# Patient Record
Sex: Female | Born: 1975 | Race: Black or African American | Hispanic: No | Marital: Single | Smoking: Former smoker
Health system: Southern US, Community
[De-identification: ages and names within clinical notes are randomized; demographics above are authoritative.]

## PROBLEM LIST (undated history)

## (undated) ENCOUNTER — Ambulatory Visit: Admission: EM

## (undated) DIAGNOSIS — Z8669 Personal history of other diseases of the nervous system and sense organs: Secondary | ICD-10-CM

## (undated) DIAGNOSIS — L309 Dermatitis, unspecified: Secondary | ICD-10-CM

## (undated) DIAGNOSIS — Z8709 Personal history of other diseases of the respiratory system: Secondary | ICD-10-CM

## (undated) DIAGNOSIS — R569 Unspecified convulsions: Secondary | ICD-10-CM

## (undated) DIAGNOSIS — R519 Headache, unspecified: Secondary | ICD-10-CM

## (undated) DIAGNOSIS — L509 Urticaria, unspecified: Secondary | ICD-10-CM

## (undated) DIAGNOSIS — F419 Anxiety disorder, unspecified: Secondary | ICD-10-CM

## (undated) DIAGNOSIS — K644 Residual hemorrhoidal skin tags: Secondary | ICD-10-CM

## (undated) DIAGNOSIS — I10 Essential (primary) hypertension: Secondary | ICD-10-CM

## (undated) DIAGNOSIS — R51 Headache: Secondary | ICD-10-CM

## (undated) DIAGNOSIS — T7840XA Allergy, unspecified, initial encounter: Secondary | ICD-10-CM

## (undated) DIAGNOSIS — J45909 Unspecified asthma, uncomplicated: Secondary | ICD-10-CM

## (undated) DIAGNOSIS — J4540 Moderate persistent asthma, uncomplicated: Secondary | ICD-10-CM

## (undated) HISTORY — DX: Moderate persistent asthma, uncomplicated: J45.40

## (undated) HISTORY — DX: Unspecified convulsions: R56.9

## (undated) HISTORY — PX: ABDOMINAL HYSTERECTOMY: SHX81

## (undated) HISTORY — DX: Personal history of other diseases of the nervous system and sense organs: Z86.69

## (undated) HISTORY — DX: Personal history of other diseases of the respiratory system: Z87.09

## (undated) HISTORY — PX: DILATION AND CURETTAGE OF UTERUS: SHX78

## (undated) HISTORY — DX: Urticaria, unspecified: L50.9

## (undated) HISTORY — DX: Allergy, unspecified, initial encounter: T78.40XA

## (undated) HISTORY — DX: Essential (primary) hypertension: I10

## (undated) HISTORY — DX: Dermatitis, unspecified: L30.9

## (undated) HISTORY — PX: CERVIX SURGERY: SHX593

---

## 2014-10-25 LAB — HM PAP SMEAR: HM Pap smear: NEGATIVE

## 2016-05-02 ENCOUNTER — Ambulatory Visit (HOSPITAL_COMMUNITY)
Admission: EM | Admit: 2016-05-02 | Discharge: 2016-05-02 | Disposition: A | Discharge status: Home / Self Care | Payer: Managed Care, Other (non HMO) | Attending: Internal Medicine | Admitting: Internal Medicine

## 2016-05-02 ENCOUNTER — Encounter (HOSPITAL_COMMUNITY): Payer: Self-pay | Admitting: Emergency Medicine

## 2016-05-02 DIAGNOSIS — G43009 Migraine without aura, not intractable, without status migrainosus: Secondary | ICD-10-CM | POA: Diagnosis not present

## 2016-05-02 DIAGNOSIS — G44049 Chronic paroxysmal hemicrania, not intractable: Secondary | ICD-10-CM

## 2016-05-02 MED ORDER — DEXAMETHASONE SODIUM PHOSPHATE 10 MG/ML IJ SOLN
10.0000 mg | Freq: Once | INTRAMUSCULAR | Status: AC
  Administered 2016-05-02: 10 mg via INTRAMUSCULAR

## 2016-05-02 MED ORDER — ONDANSETRON 4 MG PO TBDP
ORAL_TABLET | ORAL | Status: AC
  Filled 2016-05-02: qty 1

## 2016-05-02 MED ORDER — KETOROLAC TROMETHAMINE 30 MG/ML IJ SOLN
30.0000 mg | Freq: Once | INTRAMUSCULAR | Status: AC
  Administered 2016-05-02: 30 mg via INTRAMUSCULAR

## 2016-05-02 MED ORDER — KETOROLAC TROMETHAMINE 30 MG/ML IJ SOLN
INTRAMUSCULAR | Status: AC
  Filled 2016-05-02: qty 1

## 2016-05-02 MED ORDER — DEXAMETHASONE SODIUM PHOSPHATE 10 MG/ML IJ SOLN
INTRAMUSCULAR | Status: AC
  Filled 2016-05-02: qty 1

## 2016-05-02 MED ORDER — ONDANSETRON HCL 4 MG PO TABS: 4.0000 mg | ORAL_TABLET | Freq: Four times a day (QID) | ORAL | 0 refills | Status: DC

## 2016-05-02 MED ORDER — INDOMETHACIN 50 MG PO CAPS: 50.0000 mg | ORAL_CAPSULE | Freq: Two times a day (BID) | ORAL | 0 refills | Status: DC

## 2016-05-02 MED ORDER — ONDANSETRON 4 MG PO TBDP
4.0000 mg | ORAL_TABLET | Freq: Once | ORAL | Status: AC
  Administered 2016-05-02: 4 mg via ORAL

## 2016-05-02 NOTE — ED Triage Notes (Signed)
Here for intermittent HA onset x2 weeks associated w/nausea  A&O x4... NAD

## 2016-05-02 NOTE — ED Provider Notes (Signed)
CSN: [id number]     Arrival date & time 05/02/16  1926 History   First MD Initiated Contact with Patient 05/02/16 2119     Chief Complaint  Patient presents with  . Headache   (Consider location/radiation/quality/duration/timing/severity/associated sxs/prior Treatment) HPI History obtained from patient:  Pt presents with the cc of:  Headache Duration of symptoms: 2 weeks Treatment prior to arrival: Over-the-counter medications and previous migraine medications without relief. Context: Typical migraine type headache but lasted longer than usual. Other symptoms include: None Pain score: 4 FAMILY HISTORY: Hypertension    History reviewed. No pertinent past medical history. History reviewed. No pertinent surgical history. No family history on file. Social History  Substance Use Topics  . Smoking status: Never Smoker  . Smokeless tobacco: Never Used  . Alcohol use No   OB History    No data available     Review of Systems  Denies: ABDOMINAL PAIN, CHEST PAIN, CONGESTION, DYSURIA, SHORTNESS OF BREATH  Allergies  Review of patient's allergies indicates no known allergies.  Home Medications   Prior to Admission medications   Medication Sig Start Date End Date Taking? Authorizing Provider  indomethacin (INDOCIN) 50 MG capsule Take 1 capsule (50 mg total) by mouth 2 (two) times daily with a meal. 05/02/16   Konrad Felix, PA  ondansetron (ZOFRAN) 4 MG tablet Take 1 tablet (4 mg total) by mouth every 6 (six) hours. 05/02/16   Konrad Felix, PA   Meds Ordered and Administered this Visit   Medications  ketorolac (TORADOL) 30 MG/ML injection 30 mg (not administered)  dexamethasone (DECADRON) injection 10 mg (not administered)  ondansetron (ZOFRAN-ODT) disintegrating tablet 4 mg (not administered)    BP 157/95 (BP Location: Right Arm)   Pulse 80   Temp 97.9 F (36.6 C) (Oral)   Resp 18   SpO2 100%  No data found.   Physical Exam NURSES NOTES AND VITAL SIGNS  REVIEWED. CONSTITUTIONAL: Well developed, well nourished, no acute distress HEENT: normocephalic, atraumatic EYES: Conjunctiva normal NECK:normal ROM, supple, no adenopathy PULMONARY:No respiratory distress, normal effort ABDOMINAL: Soft, ND, NT BS+, No CVAT MUSCULOSKELETAL: Normal ROM of all extremities,  SKIN: warm and dry without rash PSYCHIATRIC: Mood and affect, behavior are normal NEUROLOGICAL SCREENING EXAM: Constitutional:  oriented to person, place, and time.  Neurological:  .  normal strength and normal reflexes. No cranial nerve deficit or sensory deficit. negative Romberg sign. GCS eye subscore is 4. GCS verbal subscore is 5. GCS motor subscore is 6.    Urgent Care Course   Clinical Course    Procedures (including critical care time)  Labs Review Labs Reviewed - No data to display  Imaging Review No results found.   Visual Acuity Review  Right Eye Distance:   Left Eye Distance:   Bilateral Distance:    Right Eye Near:   Left Eye Near:    Bilateral Near:        Headache and nausea both better at discharge. MDM   1. Chronic paroxysmal hemicrania, not intractable   2. Migraine without aura and without status migrainosus, not intractable     Patient is reassured that there are no issues that require transfer to higher level of care at this time or additional tests. Patient is advised to continue home symptomatic treatment. Patient is advised that if there are new or worsening symptoms to attend the emergency department, contact primary care provider, or return to UC. Instructions of care provided discharged home in stable condition.  THIS NOTE WAS GENERATED USING A VOICE RECOGNITION SOFTWARE PROGRAM. ALL REASONABLE EFFORTS  WERE MADE TO PROOFREAD THIS DOCUMENT FOR ACCURACY.  I have verbally reviewed the discharge instructions with the patient. A printed AVS was given to the patient.  All questions were answered prior to discharge.      Konrad Felix, PA 05/02/16 2143

## 2016-05-02 NOTE — Discharge Instructions (Signed)
Use medications as directed and follow up as needed.

## 2016-06-24 ENCOUNTER — Encounter (HOSPITAL_BASED_OUTPATIENT_CLINIC_OR_DEPARTMENT_OTHER): Payer: Self-pay | Admitting: Emergency Medicine

## 2016-06-24 ENCOUNTER — Emergency Department (HOSPITAL_BASED_OUTPATIENT_CLINIC_OR_DEPARTMENT_OTHER)
Admission: EM | Admit: 2016-06-24 | Discharge: 2016-06-24 | Disposition: A | Discharge status: Home / Self Care | Payer: Managed Care, Other (non HMO) | Attending: Emergency Medicine | Admitting: Emergency Medicine

## 2016-06-24 DIAGNOSIS — Z7951 Long term (current) use of inhaled steroids: Secondary | ICD-10-CM | POA: Diagnosis not present

## 2016-06-24 DIAGNOSIS — J45901 Unspecified asthma with (acute) exacerbation: Secondary | ICD-10-CM | POA: Insufficient documentation

## 2016-06-24 DIAGNOSIS — R05 Cough: Secondary | ICD-10-CM | POA: Diagnosis present

## 2016-06-24 HISTORY — DX: Unspecified asthma, uncomplicated: J45.909

## 2016-06-24 MED ORDER — PREDNISONE 50 MG PO TABS
60.0000 mg | ORAL_TABLET | Freq: Once | ORAL | Status: AC
  Administered 2016-06-24: 60 mg via ORAL
  Filled 2016-06-24: qty 1

## 2016-06-24 MED ORDER — ALBUTEROL SULFATE (2.5 MG/3ML) 0.083% IN NEBU
5.0000 mg | INHALATION_SOLUTION | Freq: Once | RESPIRATORY_TRACT | Status: AC
  Administered 2016-06-24: 5 mg via RESPIRATORY_TRACT

## 2016-06-24 MED ORDER — ALBUTEROL SULFATE (2.5 MG/3ML) 0.083% IN NEBU: 2.5000 mg | INHALATION_SOLUTION | Freq: Four times a day (QID) | RESPIRATORY_TRACT | 12 refills | Status: DC | PRN

## 2016-06-24 MED ORDER — ALBUTEROL SULFATE (2.5 MG/3ML) 0.083% IN NEBU
INHALATION_SOLUTION | RESPIRATORY_TRACT | Status: AC
  Filled 2016-06-24: qty 6

## 2016-06-24 MED ORDER — IPRATROPIUM BROMIDE 0.02 % IN SOLN
RESPIRATORY_TRACT | Status: AC
  Filled 2016-06-24: qty 2.5

## 2016-06-24 MED ORDER — IPRATROPIUM BROMIDE 0.02 % IN SOLN
0.5000 mg | Freq: Once | RESPIRATORY_TRACT | Status: AC
  Administered 2016-06-24: 0.5 mg via RESPIRATORY_TRACT

## 2016-06-24 MED ORDER — ALBUTEROL SULFATE HFA 108 (90 BASE) MCG/ACT IN AERS
1.0000 | INHALATION_SPRAY | RESPIRATORY_TRACT | Status: DC | PRN
  Administered 2016-06-24: 1 via RESPIRATORY_TRACT
  Filled 2016-06-24: qty 6.7

## 2016-06-24 MED ORDER — PREDNISONE 20 MG PO TABS: 40.0000 mg | ORAL_TABLET | Freq: Every day | ORAL | 0 refills | Status: DC

## 2016-06-24 MED ORDER — LORATADINE 10 MG PO TABS: 10.0000 mg | ORAL_TABLET | Freq: Every day | ORAL | 1 refills | Status: DC

## 2016-06-24 NOTE — ED Triage Notes (Signed)
Nurse first-pt NAD-speaking in full sentences- O2 sat 100%- HR 88- RR 20

## 2016-06-24 NOTE — ED Triage Notes (Signed)
Patient reports that she is having a cough and SOB x 2 weeks with an "asthma" flair up. Has been treating herself at home

## 2016-06-24 NOTE — ED Provider Notes (Signed)
[facility] Provider Note   CSN: [id number] Arrival date & time: 06/24/16  2057  By signing my name below, I, Georgette Shell, attest that this documentation has been prepared under the direction and in the presence of Blanchie Dessert, MD. Electronically Signed: Georgette Shell, ED Scribe. 06/24/16. 10:26 PM.   History   Chief Complaint Chief Complaint  Patient presents with  . Cough   HPI Comments: Kimberly Ali is a 40 y.o. female with h/o asthma who presents to the Emergency Department complaining of gradually worsening cough onset two weeks ago with associated shortness of breath and congestion. Pt has h/o asthma and notes that her symptoms at this time are consistent with her asthma exacerbations. She has tried her usual breathing treatments with mild relief. Pt denies fever, sore throat, or any other associated symptoms.   The history is provided by the patient. No language interpreter was used.    Past Medical History:  Diagnosis Date  . Asthma     There are no active problems to display for this patient.   History reviewed. No pertinent surgical history.  OB History    No data available       Home Medications    Prior to Admission medications   Medication Sig Start Date End Date Taking? Authorizing Provider  albuterol (PROVENTIL HFA;VENTOLIN HFA) 108 (90 Base) MCG/ACT inhaler Inhale 2 puffs into the lungs every 6 (six) hours as needed for wheezing or shortness of breath.   Yes Historical Provider, MD  Fluticasone Furoate-Vilanterol (BREO ELLIPTA IN) Inhale into the lungs.   Yes Historical Provider, MD  montelukast (SINGULAIR) 10 MG tablet Take 10 mg by mouth at bedtime.   Yes Historical Provider, MD  ranitidine (ZANTAC) 150 MG capsule Take 150 mg by mouth 2 (two) times daily.   Yes Historical Provider, MD  indomethacin (INDOCIN) 50 MG capsule Take 1 capsule (50 mg total) by mouth 2 (two) times daily with a meal. 05/02/16   Konrad Felix, PA  ondansetron  (ZOFRAN) 4 MG tablet Take 1 tablet (4 mg total) by mouth every 6 (six) hours. 05/02/16   Konrad Felix, [place]    Family History History reviewed. No pertinent family history.  Social History Social History  Substance Use Topics  . Smoking status: Never Smoker  . Smokeless tobacco: Never Used  . Alcohol use No     Allergies   Review of patient's allergies indicates no known allergies.   Review of Systems Review of Systems  Constitutional: Negative for fever.  HENT: Positive for congestion. Negative for sore throat.   Respiratory: Positive for cough and shortness of breath.   All other systems reviewed and are negative.    Physical Exam Updated Vital Signs BP 153/95 (BP Location: Left Arm)   Pulse 98   Temp 97.9 F (36.6 C) (Oral)   Resp 20   Ht 5\' 5"  (1.651 m)   Wt 165 lb (74.8 kg)   SpO2 100%   BMI 27.46 kg/m   Physical Exam  Constitutional: She is oriented to person, place, and time. She appears well-developed and well-nourished.  HENT:  Head: Normocephalic and atraumatic.  Edematous nasal turbinates. Effusions behind bilateral TMs.  Eyes: EOM are normal. Pupils are equal, round, and reactive to light.  Neck: Normal range of motion. Neck supple. No JVD present.  Cardiovascular: Normal rate, regular rhythm and normal heart sounds.   No murmur heard. Pulmonary/Chest: Effort normal and breath sounds normal. She has no wheezes. She  has no rales. She exhibits no tenderness.  Abdominal: Soft. Bowel sounds are normal. She exhibits no distension and no mass. There is no tenderness.  Musculoskeletal: Normal range of motion. She exhibits no edema.  Lymphadenopathy:    She has no cervical adenopathy.  Neurological: She is alert and oriented to person, place, and time. No cranial nerve deficit. She exhibits normal muscle tone. Coordination normal.  Skin: Skin is warm and dry. No rash noted.  Psychiatric: She has a normal mood and affect. Her behavior is normal. Judgment and  thought content normal.  Nursing note and vitals reviewed.    ED Treatments / Results  DIAGNOSTIC STUDIES: Oxygen Saturation is 100% on RA, normal by my interpretation.    COORDINATION OF CARE: 10:26 PM Discussed treatment plan with pt at bedside and pt agreed to plan.  Labs (all labs ordered are listed, but only abnormal results are displayed) Labs Reviewed - No data to display  EKG  EKG Interpretation None       Radiology No results found.  Procedures Procedures (including critical care time)  Medications Ordered in ED Medications  albuterol (PROVENTIL) (2.5 MG/3ML) 0.083% nebulizer solution (not administered)  ipratropium (ATROVENT) 0.02 % nebulizer solution (not administered)  albuterol (PROVENTIL) (2.5 MG/3ML) 0.083% nebulizer solution 5 mg (5 mg Nebulization Given 06/24/16 2107)  ipratropium (ATROVENT) nebulizer solution 0.5 mg (0.5 mg Nebulization Given 06/24/16 2107)     Initial Impression / Assessment and Plan / ED Course  I have reviewed the triage vital signs and the nursing notes.  Pertinent labs & imaging results that were available during my care of the patient were reviewed by me and considered in my medical decision making (see chart for details).  Clinical Course   Pt with typical asthma exacerbation  symptoms.  No infectious sx, productive cough or other complaints.  Wheezing on exam resolved after 1 neb. will give steroids, and albuterol for home.  Final Clinical Impressions(s) / ED Diagnoses   Final diagnoses:  Exacerbation of asthma, unspecified asthma severity, unspecified whether persistent    New Prescriptions Discharge Medication List as of 06/24/2016 10:26 PM    START taking these medications   Details  albuterol (PROVENTIL) (2.5 MG/3ML) 0.083% nebulizer solution Take 3 mLs (2.5 mg total) by nebulization every 6 (six) hours as needed for wheezing or shortness of breath., Starting Mon 06/24/2016, Print    loratadine (CLARITIN) 10 MG  tablet Take 1 tablet (10 mg total) by mouth daily., Starting Mon 06/24/2016, Print    predniSONE (DELTASONE) 20 MG tablet Take 2 tablets (40 mg total) by mouth daily., Starting Mon 06/24/2016, Print       I personally performed the services described in this documentation, which was scribed in my presence.  The recorded information has been reviewed and considered.     Blanchie Dessert, MD 06/24/16 [phone]

## 2016-07-13 ENCOUNTER — Emergency Department (HOSPITAL_BASED_OUTPATIENT_CLINIC_OR_DEPARTMENT_OTHER)
Admission: EM | Admit: 2016-07-13 | Discharge: 2016-07-13 | Disposition: A | Discharge status: Home / Self Care | Payer: Managed Care, Other (non HMO) | Attending: Emergency Medicine | Admitting: Emergency Medicine

## 2016-07-13 ENCOUNTER — Encounter (HOSPITAL_BASED_OUTPATIENT_CLINIC_OR_DEPARTMENT_OTHER): Payer: Self-pay | Admitting: *Deleted

## 2016-07-13 DIAGNOSIS — J4521 Mild intermittent asthma with (acute) exacerbation: Secondary | ICD-10-CM | POA: Insufficient documentation

## 2016-07-13 DIAGNOSIS — R0602 Shortness of breath: Secondary | ICD-10-CM | POA: Diagnosis present

## 2016-07-13 MED ORDER — FLUTICASONE FUROATE-VILANTEROL 100-25 MCG/INH IN AEPB: 1.0000 | INHALATION_SPRAY | Freq: Every day | RESPIRATORY_TRACT | 1 refills | Status: DC

## 2016-07-13 MED ORDER — FLUTICASONE-SALMETEROL 250-50 MCG/DOSE IN AEPB: 1.0000 | INHALATION_SPRAY | Freq: Two times a day (BID) | RESPIRATORY_TRACT | 1 refills | Status: DC

## 2016-07-13 MED ORDER — ALBUTEROL SULFATE HFA 108 (90 BASE) MCG/ACT IN AERS
1.0000 | INHALATION_SPRAY | RESPIRATORY_TRACT | Status: DC | PRN
  Administered 2016-07-13: 1 via RESPIRATORY_TRACT
  Filled 2016-07-13: qty 6.7

## 2016-07-13 MED ORDER — ALBUTEROL SULFATE (2.5 MG/3ML) 0.083% IN NEBU: 2.5000 mg | INHALATION_SOLUTION | Freq: Four times a day (QID) | RESPIRATORY_TRACT | 2 refills | Status: DC | PRN

## 2016-07-13 MED ORDER — MOMETASONE FUROATE 0.1 % EX CREA: 1.0000 "application " | TOPICAL_CREAM | Freq: Every day | CUTANEOUS | 0 refills | Status: DC

## 2016-07-13 MED ORDER — ALBUTEROL SULFATE (2.5 MG/3ML) 0.083% IN NEBU
2.5000 mg | INHALATION_SOLUTION | Freq: Once | RESPIRATORY_TRACT | Status: AC
  Administered 2016-07-13: 2.5 mg via RESPIRATORY_TRACT
  Filled 2016-07-13: qty 3

## 2016-07-13 MED ORDER — IPRATROPIUM BROMIDE 0.02 % IN SOLN
RESPIRATORY_TRACT | Status: AC
  Administered 2016-07-13: 1 mg
  Filled 2016-07-13: qty 5

## 2016-07-13 MED ORDER — ALBUTEROL (5 MG/ML) CONTINUOUS INHALATION SOLN
10.0000 mg/h | INHALATION_SOLUTION | Freq: Once | RESPIRATORY_TRACT | Status: AC
  Administered 2016-07-13: 10 mg/h via RESPIRATORY_TRACT
  Filled 2016-07-13: qty 20

## 2016-07-13 MED ORDER — MONTELUKAST SODIUM 10 MG PO TABS: 10.0000 mg | ORAL_TABLET | Freq: Every day | ORAL | 3 refills | Status: DC

## 2016-07-13 MED ORDER — PREDNISONE 50 MG PO TABS
60.0000 mg | ORAL_TABLET | Freq: Once | ORAL | Status: AC
  Administered 2016-07-13: 60 mg via ORAL
  Filled 2016-07-13: qty 1

## 2016-07-13 MED ORDER — IPRATROPIUM-ALBUTEROL 0.5-2.5 (3) MG/3ML IN SOLN
3.0000 mL | Freq: Once | RESPIRATORY_TRACT | Status: AC
  Administered 2016-07-13: 3 mL via RESPIRATORY_TRACT
  Filled 2016-07-13: qty 3

## 2016-07-13 MED ORDER — PREDNISONE 20 MG PO TABS: 40.0000 mg | ORAL_TABLET | Freq: Every day | ORAL | 0 refills | Status: DC

## 2016-07-13 NOTE — ED Triage Notes (Signed)
Patient c/o wheezing/SOB since last night. She states that she used her inhaler last night and nebulizer today but no relief, non=productive cough

## 2016-07-13 NOTE — ED Provider Notes (Signed)
[facility] Provider Note   CSN: [id number] Arrival date & time: 07/13/16  1217     History   Chief Complaint Chief Complaint  Patient presents with  . Shortness of Breath    HPI Kimberly Ali is a 40 y.o. female.  The history is provided by the patient.  Shortness of Breath  This is a recurrent problem. The average episode lasts 2 days. The problem occurs continuously.The current episode started 2 days ago. The problem has been gradually worsening. Associated symptoms include rhinorrhea, sore throat, cough and wheezing. Pertinent negatives include no fever, no sputum production, no chest pain, no abdominal pain, no leg pain and no leg swelling. Precipitated by: uri sx. She has tried beta-agonist inhalers for the symptoms. The treatment provided no relief. She has had no prior hospitalizations. She has had prior ED visits. She has had no prior ICU admissions. Associated medical issues include asthma.    Past Medical History:  Diagnosis Date  . Asthma     There are no active problems to display for this patient.   Past Surgical History:  Procedure Laterality Date  . ABDOMINAL HYSTERECTOMY      OB History    No data available       Home Medications    Prior to Admission medications   Medication Sig Start Date End Date Taking? Authorizing Provider  albuterol (PROVENTIL) (2.5 MG/3ML) 0.083% nebulizer solution Take 3 mLs (2.5 mg total) by nebulization every 6 (six) hours as needed for wheezing or shortness of breath. 06/24/16   Blanchie Dessert, MD  Fluticasone Furoate-Vilanterol (BREO ELLIPTA IN) Inhale into the lungs.    Historical Provider, MD  indomethacin (INDOCIN) 50 MG capsule Take 1 capsule (50 mg total) by mouth 2 (two) times daily with a meal. 05/02/16   Konrad Felix, PA  loratadine (CLARITIN) 10 MG tablet Take 1 tablet (10 mg total) by mouth daily. 06/24/16   Blanchie Dessert, MD  montelukast (SINGULAIR) 10 MG tablet Take 10 mg by mouth at  bedtime.    Historical Provider, MD  ondansetron (ZOFRAN) 4 MG tablet Take 1 tablet (4 mg total) by mouth every 6 (six) hours. 05/02/16   Konrad Felix, PA  predniSONE (DELTASONE) 20 MG tablet Take 2 tablets (40 mg total) by mouth daily. 06/24/16   Blanchie Dessert, MD  ranitidine (ZANTAC) 150 MG capsule Take 150 mg by mouth 2 (two) times daily.    Historical Provider, MD    Family History No family history on file.  Social History Social History  Substance Use Topics  . Smoking status: Never Smoker  . Smokeless tobacco: Never Used  . Alcohol use No     Allergies   Patient has no known allergies.   Review of Systems Review of Systems  Constitutional: Negative for fever.  HENT: Positive for rhinorrhea and sore throat.   Respiratory: Positive for cough, shortness of breath and wheezing. Negative for sputum production.   Cardiovascular: Negative for chest pain and leg swelling.  Gastrointestinal: Negative for abdominal pain.  All other systems reviewed and are negative.    Physical Exam Updated Vital Signs BP (!) 168/105 (BP Location: Right Arm)   Pulse 113   Temp 98.7 F (37.1 C) (Oral)   Resp 16   Ht 5\' 5"  (1.651 m)   Wt 162 lb (73.5 kg)   SpO2 100%   BMI 26.96 kg/m   Physical Exam  Constitutional: She is oriented to person, place, and time. She appears  well-developed and well-nourished. No distress.  HENT:  Head: Normocephalic and atraumatic.  Right Ear: Tympanic membrane normal.  Left Ear: Tympanic membrane normal.  Mouth/Throat: Oropharynx is clear and moist.  Eyes: Conjunctivae and EOM are normal. Pupils are equal, round, and reactive to light.  Neck: Normal range of motion. Neck supple.  Cardiovascular: Normal rate, regular rhythm and intact distal pulses.   No murmur heard. Pulmonary/Chest: Effort normal. No respiratory distress. She has decreased breath sounds. She has wheezes. She has no rales.  Abdominal: Soft. She exhibits no distension. There is no  tenderness. There is no rebound and no guarding.  Musculoskeletal: Normal range of motion. She exhibits no edema or tenderness.  Neurological: She is alert and oriented to person, place, and time.  Skin: Skin is warm and dry. No rash noted. No erythema.  Psychiatric: She has a normal mood and affect. Her behavior is normal.  Nursing note and vitals reviewed.    ED Treatments / Results  Labs (all labs ordered are listed, but only abnormal results are displayed) Labs Reviewed - No data to display  EKG  EKG Interpretation None       Radiology No results found.  Procedures Procedures (including critical care time)  Medications Ordered in ED Medications  predniSONE (DELTASONE) tablet 60 mg (not administered)  albuterol (PROVENTIL,VENTOLIN) solution continuous neb (not administered)  ipratropium-albuterol (DUONEB) 0.5-2.5 (3) MG/3ML nebulizer solution 3 mL (3 mLs Nebulization Given by Other 07/13/16 1227)  albuterol (PROVENTIL) (2.5 MG/3ML) 0.083% nebulizer solution 2.5 mg (2.5 mg Nebulization Given by Other 07/13/16 1227)     Initial Impression / Assessment and Plan / ED Course  I have reviewed the triage vital signs and the nursing notes.  Pertinent labs & imaging results that were available during my care of the patient were reviewed by me and considered in my medical decision making (see chart for details).  Clinical Course     Pt with typical asthma exacerbation  Symptoms most likely triggered by URI with associated sore throat and congestion.  No productive cough or other complaints.  Wheezing on exam.  will give steroids, albuterol/atrovent and hour long neb.  2:32 PM After hour long neb wheezing resolved and pt more comfortable.  When speaking with the pt she has not gotten a PCP here yet and out of home meds including singulair and brio.  Given ppx and encouraged to get PCP.   Final Clinical Impressions(s) / ED Diagnoses   Final diagnoses:  Exacerbation of  intermittent asthma, unspecified asthma severity    New Prescriptions New Prescriptions   FLUTICASONE-SALMETEROL (ADVAIR DISKUS) 250-50 MCG/DOSE AEPB    Inhale 1 puff into the lungs 2 (two) times daily.   MOMETASONE (ELOCON) 0.1 % CREAM    Apply 1 application topically daily.   MONTELUKAST (SINGULAIR) 10 MG TABLET    Take 1 tablet (10 mg total) by mouth at bedtime.   PREDNISONE (DELTASONE) 20 MG TABLET    Take 2 tablets (40 mg total) by mouth daily.     Blanchie Dessert, MD 07/13/16 [phone]

## 2016-07-13 NOTE — ED Notes (Signed)
Pt reports approx 10 asthma flare-ups since this past August.

## 2016-07-25 ENCOUNTER — Telehealth: Payer: Self-pay | Admitting: Behavioral Health

## 2016-07-25 NOTE — Telephone Encounter (Signed)
Unable to reach patient at time of Pre-Visit Call.  Left message for patient to return call when available.    

## 2016-07-26 ENCOUNTER — Ambulatory Visit: Payer: Managed Care, Other (non HMO) | Admitting: Family Medicine

## 2016-08-15 ENCOUNTER — Encounter: Payer: Self-pay | Admitting: Family Medicine

## 2016-08-15 ENCOUNTER — Ambulatory Visit (INDEPENDENT_AMBULATORY_CARE_PROVIDER_SITE_OTHER): Payer: Managed Care, Other (non HMO) | Admitting: Family Medicine

## 2016-08-15 VITALS — BP 112/70 | HR 95 | Temp 98.3°F | Ht 65.0 in | Wt 161.0 lb

## 2016-08-15 DIAGNOSIS — Z1231 Encounter for screening mammogram for malignant neoplasm of breast: Secondary | ICD-10-CM

## 2016-08-15 DIAGNOSIS — J4540 Moderate persistent asthma, uncomplicated: Secondary | ICD-10-CM | POA: Diagnosis not present

## 2016-08-15 DIAGNOSIS — L209 Atopic dermatitis, unspecified: Secondary | ICD-10-CM | POA: Diagnosis not present

## 2016-08-15 DIAGNOSIS — Z1239 Encounter for other screening for malignant neoplasm of breast: Secondary | ICD-10-CM

## 2016-08-15 MED ORDER — BUDESONIDE 90 MCG/ACT IN AEPB: INHALATION_SPRAY | RESPIRATORY_TRACT | 2 refills | Status: DC

## 2016-08-15 MED ORDER — ALBUTEROL SULFATE HFA 108 (90 BASE) MCG/ACT IN AERS: 2.0000 | INHALATION_SPRAY | Freq: Four times a day (QID) | RESPIRATORY_TRACT | 2 refills | Status: DC | PRN

## 2016-08-15 NOTE — Progress Notes (Signed)
Pre visit review using our clinic review tool, if applicable. No additional management support is needed unless otherwise documented below in the visit note. 

## 2016-08-15 NOTE — Patient Instructions (Signed)
Use the Pulmicort when you are in your Yellow Zone. Remember to rinse your mouth out after use.

## 2016-08-15 NOTE — Progress Notes (Signed)
Chief Complaint  Patient presents with  . Establish Care    Pt reports being in ED for asthma and urgent care for migraines        New Patient Visit SUBJECTIVE: HPI: Kimberly Ali is an 40 y.o.female who is being seen for establishing care.  Asthma Diagnosed as a child with Spirometry.  Hospitalized in 2015 for first time. She did not require intubation. She is currently on Breo, Singulair and Albuterol.  She uses the albuterol inhaler once per month on average if she is not sick.  Triggers: respiratory illness, heat, allergens Was recently in ED.  Does not have an asthma action plan.  Had a mammogram at 10, unsure why. No hx of BC or personal history of masses.  She has a history of dermatitis on her hands believed to be related to a type of hand sanitizer. She was treated with a steroid cream and it did resolve. It came back a few weeks ago, coincidentally after using the hand sanitizer again at work. She also noticed patches forming on her inner L elbow and R axillary area. No pain or new topicals.  No Known Allergies  Past Medical History:  Diagnosis Date  . Asthma   . History of hay fever   . History of migraine    Past Surgical History:  Procedure Laterality Date  . ABDOMINAL HYSTERECTOMY    . CERVIX SURGERY     x2   Social History   Social History  . Marital status: Single   Social History Main Topics  . Smoking status: Never Smoker  . Smokeless tobacco: Never Used  . Alcohol use No  . Drug use: Unknown   Family History  Problem Relation Age of Onset  . Cancer Father     prostate  . Hypertension Father   . Diabetes Father   . Alcohol abuse Maternal Grandmother   . Alcohol abuse Maternal Grandfather      Current Outpatient Prescriptions:  .  fluticasone furoate-vilanterol (BREO ELLIPTA) 100-25 MCG/INH AEPB, Inhale 1 puff into the lungs daily., Disp: 28 each, Rfl: 1 .  loratadine (CLARITIN) 10 MG tablet, Take 1 tablet (10 mg total) by mouth daily.,  Disp: 30 tablet, Rfl: 1 .  mometasone (ELOCON) 0.1 % cream, Apply 1 application topically daily., Disp: 45 g, Rfl: 0 .  montelukast (SINGULAIR) 10 MG tablet, Take 1 tablet (10 mg total) by mouth at bedtime., Disp: 30 tablet, Rfl: 3 .  albuterol (PROVENTIL HFA;VENTOLIN HFA) 108 (90 Base) MCG/ACT inhaler, Inhale 2 puffs into the lungs every 6 (six) hours as needed for wheezing or shortness of breath., Disp: 1 Inhaler, Rfl: 2 .  Budesonide 90 MCG/ACT inhaler, 2 puffs twice daily when you are in your "Yellow Zone", Disp: 1 Inhaler, Rfl: 2 .  indomethacin (INDOCIN) 50 MG capsule, Take 1 capsule (50 mg total) by mouth 2 (two) times daily with a meal. (Patient not taking: Reported on 08/15/2016), Disp: 30 capsule, Rfl: 0 .  ranitidine (ZANTAC) 150 MG capsule, Take 150 mg by mouth 2 (two) times daily., Disp: , Rfl:   No LMP recorded. Patient has had a hysterectomy.  ROS Skin: As noted in HPI  Respiratory: Denies dyspnea   OBJECTIVE: BP 112/70 (BP Location: Left Arm, Patient Position: Sitting, Cuff Size: Small)   Pulse 95   Temp 98.3 F (36.8 C) (Oral)   Ht 5\' 5"  (1.651 m)   Wt 161 lb (73 kg)   SpO2 97%   BMI 26.79 kg/m  Constitutional: -  VS reviewed -  Well developed, well nourished, appears stated age -  No apparent distress  Psychiatric: -  Oriented to person, place, and time -  Memory intact -  Affect and mood normal -  Fluent conversation, good eye contact -  Judgment and insight age appropriate  Eye: -  Conjunctivae clear, no discharge -  Pupils symmetric, round, reactive to light  ENMT: -  Oral mucosa without lesions, tongue and uvula midline    Tonsils not enlarged, no erythema, no exudate, trachea midline    Pharynx moist, no lesions, no erythema  Neck: -  No gross swelling, no palpable masses -  Thyroid midline, not enlarged, mobile, no palpable masses  Cardiovascular: -  RRR, no murmurs -  No LE edema  Respiratory: -  Normal respiratory effort, no accessory muscle use,  no retraction -  Breath sounds equal, no wheezes, no ronchi, no crackles  Gastrointestinal: -  Bowel sounds normal -  No tenderness, no distention, no guarding, no masses  Neurological:  -  CN II - XII grossly intact -  Sensation grossly intact to light touch, equal bilaterally  Skin: -  Thickened and scaled skin on the dorsum on the R hand, medial antecubital region on L, and in the R axillary region. There are also 3 circular and freely moveable nodules in the R axillary region that are not TTP, erythematous, with an opening, feels like small cysts   ASSESSMENT/PLAN: Moderate persistent asthma without complication - Plan: albuterol (PROVENTIL HFA;VENTOLIN HFA) 108 (90 Base) MCG/ACT inhaler, Budesonide 90 MCG/ACT inhaler  Atopic dermatitis, unspecified type - Plan: Ambulatory referral to Dermatology  Screening for breast cancer - Plan: MM DIGITAL SCREENING BILATERAL  Patient instructed to sign release of records form from her previous PCP. Refill albuterol, it appears she is well controlled at this time. I will add a ICS to use when she is in her yellow zone to hopefully avoid ER visits in the future. She knows what her triggers are. She is requesting a referral to derm for her skin. I'm OK with this. I do not believe that she requires cervical cancer screen Patient should return in 2-4 weeks for a CPE and discuss headaches. The patient voiced understanding and agreement to the plan.   [place], DO 08/15/16  2:47 PM

## 2016-08-22 ENCOUNTER — Ambulatory Visit (HOSPITAL_BASED_OUTPATIENT_CLINIC_OR_DEPARTMENT_OTHER)
Admission: RE | Admit: 2016-08-22 | Discharge: 2016-08-22 | Disposition: A | Discharge status: Home / Self Care | Payer: Managed Care, Other (non HMO) | Source: Ambulatory Visit | Attending: Family Medicine | Admitting: Family Medicine

## 2016-08-22 DIAGNOSIS — Z1231 Encounter for screening mammogram for malignant neoplasm of breast: Secondary | ICD-10-CM | POA: Insufficient documentation

## 2016-08-22 DIAGNOSIS — Z1239 Encounter for other screening for malignant neoplasm of breast: Secondary | ICD-10-CM

## 2016-08-26 HISTORY — PX: HEMORRHOID SURGERY: SHX153

## 2016-09-03 ENCOUNTER — Other Ambulatory Visit: Payer: Self-pay | Admitting: Family Medicine

## 2016-09-03 DIAGNOSIS — R928 Other abnormal and inconclusive findings on diagnostic imaging of breast: Secondary | ICD-10-CM

## 2016-09-09 ENCOUNTER — Telehealth: Payer: Self-pay | Admitting: Family Medicine

## 2016-09-09 NOTE — Telephone Encounter (Signed)
error:[id number] ° °

## 2016-09-10 ENCOUNTER — Other Ambulatory Visit: Payer: Self-pay | Admitting: Family Medicine

## 2016-09-10 ENCOUNTER — Other Ambulatory Visit: Payer: Self-pay

## 2016-09-10 ENCOUNTER — Other Ambulatory Visit: Payer: Managed Care, Other (non HMO)

## 2016-09-10 DIAGNOSIS — R928 Other abnormal and inconclusive findings on diagnostic imaging of breast: Secondary | ICD-10-CM

## 2016-09-11 ENCOUNTER — Other Ambulatory Visit: Payer: Self-pay

## 2016-09-11 ENCOUNTER — Other Ambulatory Visit: Payer: BLUE CROSS/BLUE SHIELD

## 2016-09-12 ENCOUNTER — Encounter: Payer: Managed Care, Other (non HMO) | Admitting: Family Medicine

## 2016-09-16 ENCOUNTER — Ambulatory Visit
Admission: RE | Admit: 2016-09-16 | Discharge: 2016-09-16 | Disposition: A | Discharge status: Home / Self Care | Payer: BLUE CROSS/BLUE SHIELD | Source: Ambulatory Visit | Attending: Family Medicine | Admitting: Family Medicine

## 2016-09-16 ENCOUNTER — Other Ambulatory Visit: Payer: Self-pay | Admitting: Family Medicine

## 2016-09-16 DIAGNOSIS — R928 Other abnormal and inconclusive findings on diagnostic imaging of breast: Secondary | ICD-10-CM

## 2016-10-20 ENCOUNTER — Other Ambulatory Visit: Payer: Self-pay | Admitting: Family Medicine

## 2016-10-21 NOTE — Telephone Encounter (Signed)
Refilled Rx for Breo . #60 dispense #1 refill. TL/CMA

## 2016-12-09 ENCOUNTER — Encounter: Payer: BLUE CROSS/BLUE SHIELD | Admitting: Family Medicine

## 2016-12-16 ENCOUNTER — Telehealth: Payer: Self-pay | Admitting: Family Medicine

## 2016-12-16 ENCOUNTER — Encounter: Payer: BLUE CROSS/BLUE SHIELD | Admitting: Family Medicine

## 2016-12-16 NOTE — Telephone Encounter (Signed)
Charge. TY.

## 2016-12-16 NOTE — Telephone Encounter (Signed)
Pt lvm at 11:53a to reschedule appt. She is currently driving back from [place] and will not be back by her appt time.   Please advise, should pt be charged?   Pt has been rescheduled till Friday at 8:30

## 2016-12-20 ENCOUNTER — Encounter: Payer: Self-pay | Admitting: Family Medicine

## 2016-12-20 ENCOUNTER — Other Ambulatory Visit (INDEPENDENT_AMBULATORY_CARE_PROVIDER_SITE_OTHER): Payer: BLUE CROSS/BLUE SHIELD

## 2016-12-20 ENCOUNTER — Ambulatory Visit (INDEPENDENT_AMBULATORY_CARE_PROVIDER_SITE_OTHER): Payer: BLUE CROSS/BLUE SHIELD | Admitting: Family Medicine

## 2016-12-20 VITALS — BP 118/76 | HR 85 | Temp 98.5°F | Ht 65.0 in | Wt 162.4 lb

## 2016-12-20 DIAGNOSIS — Z0000 Encounter for general adult medical examination without abnormal findings: Secondary | ICD-10-CM

## 2016-12-20 DIAGNOSIS — J4540 Moderate persistent asthma, uncomplicated: Secondary | ICD-10-CM | POA: Diagnosis not present

## 2016-12-20 HISTORY — DX: Moderate persistent asthma, uncomplicated: J45.40

## 2016-12-20 LAB — COMPREHENSIVE METABOLIC PANEL
ALT: 12 U/L (ref 0–35)
AST: 13 U/L (ref 0–37)
Albumin: 4.3 g/dL (ref 3.5–5.2)
Alkaline Phosphatase: 65 U/L (ref 39–117)
BUN: 13 mg/dL (ref 6–23)
CO2: 28 mEq/L (ref 19–32)
Calcium: 9.6 mg/dL (ref 8.4–10.5)
Chloride: 103 mEq/L (ref 96–112)
Creatinine, Ser: 0.72 mg/dL (ref 0.40–1.20)
GFR: 114.65 mL/min (ref >60.00)
Glucose, Bld: 154 mg/dL — ABNORMAL HIGH (ref 70–99)
Potassium: 4.1 mEq/L (ref 3.5–5.1)
Sodium: 138 mEq/L (ref 135–145)
Total Bilirubin: 0.3 mg/dL (ref 0.2–1.2)
Total Protein: 7.1 g/dL (ref 6.0–8.3)

## 2016-12-20 LAB — CBC
HCT: 36.6 % (ref 36.0–46.0)
Hemoglobin: 11.7 g/dL — ABNORMAL LOW (ref 12.0–15.0)
MCHC: 31.9 g/dL (ref 30.0–36.0)
MCV: 83.3 fl (ref 78.0–100.0)
Platelets: 299 10*3/uL (ref 150.0–400.0)
RBC: 4.4 Mil/uL (ref 3.87–5.11)
RDW: 14.6 % (ref 11.5–15.5)
WBC: 6.7 10*3/uL (ref 4.0–10.5)

## 2016-12-20 LAB — LIPID PANEL
Cholesterol: 147 mg/dL (ref 0–200)
HDL: 53.6 mg/dL (ref >39.00)
LDL Cholesterol: 84 mg/dL (ref 0–99)
NonHDL: 93.56
Total CHOL/HDL Ratio: 3
Triglycerides: 50 mg/dL (ref 0.0–149.0)
VLDL: 10 mg/dL (ref 0.0–40.0)

## 2016-12-20 LAB — IBC PANEL
Iron: 47 ug/dL (ref 42–145)
Saturation Ratios: 14.9 % — ABNORMAL LOW (ref 20.0–50.0)
Transferrin: 225 mg/dL (ref 212.0–360.0)

## 2016-12-20 LAB — FERRITIN: Ferritin: 35.1 ng/mL (ref 10.0–291.0)

## 2016-12-20 MED ORDER — FLUTICASONE FUROATE-VILANTEROL 100-25 MCG/INH IN AEPB: INHALATION_SPRAY | RESPIRATORY_TRACT | 3 refills | Status: DC

## 2016-12-20 NOTE — Progress Notes (Signed)
Pre visit review using our clinic review tool, if applicable. No additional management support is needed unless otherwise documented below in the visit note. 

## 2016-12-20 NOTE — Progress Notes (Signed)
Chief Complaint  Patient presents with  . Annual Exam    fasting     Well Woman Kimberly Ali is here for a complete physical.   Her last physical was >1 year ago.  Current diet: in general, a "healthy" diet  . Current exercise: Going to gym in her complex 2-3 times per week. Weight is stable and she denies daytime fatigue. No LMP recorded. Patient has had a hysterectomy..  Seatbelt? Yes  Health Maintenance Pap/HPV- Hysterectomy for fibroids Mammogram- Yes Tetanus- Yes HIV- Yes  Past Medical History:  Diagnosis Date  . Asthma   . History of hay fever   . History of migraine   . Moderate persistent asthma 12/20/2016    Past Surgical History:  Procedure Laterality Date  . ABDOMINAL HYSTERECTOMY     Fibroids  . CERVIX SURGERY     x2   Medications  Current Outpatient Prescriptions on File Prior to Visit  Medication Sig Dispense Refill  . albuterol (PROVENTIL HFA;VENTOLIN HFA) 108 (90 Base) MCG/ACT inhaler Inhale 2 puffs into the lungs every 6 (six) hours as needed for wheezing or shortness of breath. 1 Inhaler 2  . BREO ELLIPTA 100-25 MCG/INH AEPB USE 1 INHALATION DAILY 60 each 1  . Budesonide 90 MCG/ACT inhaler 2 puffs twice daily when you are in your "Yellow Zone" 1 Inhaler 2  . indomethacin (INDOCIN) 50 MG capsule Take 1 capsule (50 mg total) by mouth 2 (two) times daily with a meal. (Patient not taking: Reported on 08/15/2016) 30 capsule 0  . loratadine (CLARITIN) 10 MG tablet Take 1 tablet (10 mg total) by mouth daily. 30 tablet 1  . mometasone (ELOCON) 0.1 % cream Apply 1 application topically daily. 45 g 0  . montelukast (SINGULAIR) 10 MG tablet Take 1 tablet (10 mg total) by mouth at bedtime. 30 tablet 3  . ranitidine (ZANTAC) 150 MG capsule Take 150 mg by mouth 2 (two) times daily.     Allergies No Known Allergies  Review of Systems: Constitutional:  no unexpected change in weight, no weakness, no unexplained fevers, sweats, or chills Eye:  no recent  significant change in vision Ear/Nose/Mouth/Throat:  Ears:  no tinnitus or vertigo and no recent change in hearing, Nose/Mouth/Throat:  no complaints of nasal congestion or discharge, no sore throat and no recent change in voice or hoarseness Cardiovascular:  no exercise intolerance, no chest pain, no palpitations Respiratory:  no chronic cough, sputum, or hemoptysis and no shortness of breath Gastrointestinal:  no abdominal pain, no change in bowel habits, no significant change in appetite, no nausea, vomiting, diarrhea, or constipation and no black or bloody stool GU:  Female: negative for dysuria, frequency, and incontinence, Normal menses; no abnormal bleeding, pelvic pain, or discharge Musculoskeletal/Extremities:  no pain, redness, or swelling of the joints Integumentary (Skin/Breast):  no abnormal skin lesions reported, no new breast lumps or masses Neurologic:  no chronic headaches, no numbness, tingling, or tremor Psychiatric:  no anxiety, no depression Endocrine:  denies fatigue, weight changes, heat/cold intolerance, bowel or skin changes, or cardiovascular system symptoms Hematologic/Lymphatic:  no abnormal bleeding, no HIV risk factors, no night sweats, no swollen nodes, no weight loss Allergic/Immunologic:  no history of food or environmental allergies  Exam BP 118/76 (BP Location: Left Arm, Patient Position: Sitting, Cuff Size: Normal)   Pulse 85   Temp 98.5 F (36.9 C) (Oral)   Ht 5\' 5"  (1.651 m)   Wt 162 lb 6.4 oz (73.7 kg)   SpO2 98%  BMI 27.02 kg/m  General:  well developed, well nourished, in no apparent distress Skin:  no significant moles, warts, or growths Head:  no masses, lesions, or tenderness Eyes:  pupils equal and round, sclera anicteric without injection Ears:  canals without lesions, TMs shiny without retraction, no obvious effusion, no erythema Nose:  nares patent, septum midline, mucosa normal, and no drainage or sinus tenderness Throat/Pharynx:  lips  and gingiva without lesion; tongue and uvula midline; non-inflamed pharynx; no exudates or postnasal drainage Neck: neck supple without adenopathy, thyromegaly, or masses Breasts: Not done Thorax:  nontender Lungs:  clear to auscultation, breath sounds equal bilaterally, no respiratory distress Cardio:  regular rate and rhythm without murmurs, heart sounds without clicks or rubs, point of maximal impulse normal; no lifts, heaves, or thrills Abdomen:  abdomen soft, nontender; bowel sounds normal; no masses or organomegaly Genital: Not done Musculoskeletal:  symmetrical muscle groups noted without atrophy or deformity Extremities:  no clubbing, cyanosis, or edema, no deformities, no skin discoloration Neuro:  gait normal; deep tendon reflexes normal and symmetric Psych: well oriented with normal range of affect and appropriate judgment/insight  Assessment and Plan  Well adult exam - Plan: CBC, Comprehensive metabolic panel, Lipid panel  Moderate persistent asthma without complication - Plan: fluticasone furoate-vilanterol (BREO ELLIPTA) 100-25 MCG/INH AEPB   Well 41 y.o. female.  Counseled on diet and exercise.  Other orders as above.  Pt had issue with insurance coverage for Memory [employer]- will try to call in "90 day supply", OK to sub with whatever insurance will cover as it appears to only come in 2 mo supplies and 4 week supplies.  Follow up in 6 mo to check asthma. The patient voiced understanding and agreement to the plan.  [place], DO 12/20/16 9:23 AM

## 2016-12-20 NOTE — Patient Instructions (Signed)
Keep up the good work with your dieting and exercising.

## 2016-12-23 LAB — HEMOGLOBIN A1C: Hgb A1c MFr Bld: 6 % (ref 4.6–6.5)

## 2017-04-06 ENCOUNTER — Encounter (HOSPITAL_BASED_OUTPATIENT_CLINIC_OR_DEPARTMENT_OTHER): Payer: Self-pay | Admitting: Adult Health

## 2017-04-06 ENCOUNTER — Emergency Department (HOSPITAL_BASED_OUTPATIENT_CLINIC_OR_DEPARTMENT_OTHER)
Admission: EM | Admit: 2017-04-06 | Discharge: 2017-04-07 | Disposition: A | Discharge status: Home / Self Care | Payer: BLUE CROSS/BLUE SHIELD | Attending: Emergency Medicine | Admitting: Emergency Medicine

## 2017-04-06 DIAGNOSIS — H9202 Otalgia, left ear: Secondary | ICD-10-CM | POA: Insufficient documentation

## 2017-04-06 DIAGNOSIS — J4540 Moderate persistent asthma, uncomplicated: Secondary | ICD-10-CM | POA: Diagnosis not present

## 2017-04-06 DIAGNOSIS — Z79899 Other long term (current) drug therapy: Secondary | ICD-10-CM | POA: Diagnosis not present

## 2017-04-06 DIAGNOSIS — H65192 Other acute nonsuppurative otitis media, left ear: Secondary | ICD-10-CM

## 2017-04-06 NOTE — ED Provider Notes (Signed)
[facility] Provider Note   CSN: [id number] Arrival date & time: 04/06/17  2307  By signing my name below, I, Kimberly Ali, attest that this documentation has been prepared under the direction and in the presence of Kimberly Ali, Barbette Hair, MD. Electronically Signed: Margit Ali, ED Scribe. 04/06/17. 11:57 PM.  History   Chief Complaint Chief Complaint  Patient presents with  . Otalgia    HPI Kimberly Ali is a 41 y.o. female with a PMHx of asthma, and seasonal allergies, who presents to the Emergency Department complaining of a gradually worsening, crackling and throbbing sensation to her left ear for the last 5 days. Pt has never felt anything like this before. Denies pain just funny feeling.  Lying down exacerbates her discomfort. She takes daily medication for her allergies and asthma, including Flonase, Claritin, and Singulair. Pt denies fever, cough, HA, neck pain, neck stiffness, hearing loss, or any other complaints at this time.   The history is provided by the patient. No language interpreter was used.    Past Medical History:  Diagnosis Date  . Asthma   . History of hay fever   . History of migraine   . Moderate persistent asthma 12/20/2016    Patient Active Problem List   Diagnosis Date Noted  . Moderate persistent asthma 12/20/2016    Past Surgical History:  Procedure Laterality Date  . ABDOMINAL HYSTERECTOMY     Fibroids  . CERVIX SURGERY     x2    OB History    No data available       Home Medications    Prior to Admission medications   Medication Sig Start Date End Date Taking? Authorizing Provider  albuterol (PROVENTIL HFA;VENTOLIN HFA) 108 (90 Base) MCG/ACT inhaler Inhale 2 puffs into the lungs every 6 (six) hours as needed for wheezing or shortness of breath. 08/15/16   Shelda Pal, DO  albuterol (PROVENTIL) (2.5 MG/3ML) 0.083% nebulizer solution As directed. 12/12/16   [provider]  fluticasone (FLONASE)  50 MCG/ACT nasal spray Place 2 sprays into both nostrils daily. 04/07/17   Janari Gagner, Barbette Hair, MD  fluticasone furoate-vilanterol (BREO ELLIPTA) 100-25 MCG/INH AEPB USE 1 INHALATION DAILY. Rinse mouth out after use. 12/20/16   Wendling, Crosby Oyster, DO  ibuprofen (ADVIL,MOTRIN) 600 MG tablet Take 1 tablet (600 mg total) by mouth every 6 (six) hours as needed. 04/07/17   Carleton Vanvalkenburgh, Barbette Hair, MD  indomethacin (INDOCIN) 50 MG capsule Take 1 capsule (50 mg total) by mouth 2 (two) times daily with a meal. 05/02/16   Konrad Felix, PA  loratadine (CLARITIN) 10 MG tablet Take 1 tablet (10 mg total) by mouth daily. 06/24/16   Blanchie Dessert, MD  montelukast (SINGULAIR) 10 MG tablet Take 1 tablet (10 mg total) by mouth at bedtime. 07/13/16   Blanchie Dessert, MD  sodium chloride (OCEAN) 0.65 % SOLN nasal spray Place 1 spray into both nostrils as needed for congestion. 04/07/17   Ciela Mahajan, Barbette Hair, MD    Family History Family History  Problem Relation Age of Onset  . Cancer Father        prostate  . Hypertension Father   . Diabetes Father   . Alcohol abuse Maternal Grandmother   . Alcohol abuse Maternal Grandfather     Social History Social History  Substance Use Topics  . Smoking status: Never Smoker  . Smokeless tobacco: Never Used  . Alcohol use No     Allergies   Patient has no known  allergies.   Review of Systems Review of Systems  Constitutional: Negative for fever.  HENT: Positive for ear pain. Negative for hearing loss.   Respiratory: Negative for cough.   Musculoskeletal: Negative for neck pain and neck stiffness.  Neurological: Negative for headaches.  All other systems reviewed and are negative.    Physical Exam Updated Vital Signs BP (!) 161/108   Pulse 79   Temp 98.1 F (36.7 C) (Oral)   Resp 18   SpO2 99%   Physical Exam  Constitutional: She is oriented to person, place, and time. She appears well-developed and well-nourished. No distress.  HENT:  Head:  Normocephalic and atraumatic.  Effusion noted behind TM, intact light reflex, mild erythema, no bulging, right TM clear  Eyes: Pupils are equal, round, and reactive to light.  Cardiovascular: Normal rate, regular rhythm and normal heart sounds.   Pulmonary/Chest: Effort normal. No respiratory distress. She has no wheezes.  Neurological: She is alert and oriented to person, place, and time.  Skin: Skin is warm and dry.  Psychiatric: She has a normal mood and affect.  Nursing note and vitals reviewed.    ED Treatments / Results  DIAGNOSTIC STUDIES: Oxygen Saturation is 99% on RA, normal by my interpretation.   COORDINATION OF CARE: 11:57 PM-Discussed next steps with pt which includes draining the fluid behind her ear by using Flonase and a nasal saline to clear out her nose. She is also to take a low dose of ibuprofen for discomfort. And she is to monitor her sx. After 3-5 days she should follow up with an ENT doctor if her sx do not improve. Pt verbalized understanding and is agreeable with the plan.   Labs (all labs ordered are listed, but only abnormal results are displayed) Labs Reviewed - No data to display  EKG  EKG Interpretation None       Radiology No results found.  Procedures Procedures (including critical care time)  Medications Ordered in ED Medications - No data to display   Initial Impression / Assessment and Plan / ED Course  I have reviewed the triage vital signs and the nursing notes.  Pertinent labs & imaging results that were available during my care of the patient were reviewed by me and considered in my medical decision making (see chart for details).     Patient presents with left ear discomfort and funny feeling. Ongoing for the last several days. Afebrile. Nontoxic. She does have an effusion and mild erythema but no overt infection. Significant history of allergies. No other upper respiratory symptoms. She denies any hearing loss. Suspect  symptoms may be related to effusion. Recommend continue Flonase. Additional nasal saline to drain middle ear and ibuprofen as needed for discomfort. Continue allergy medications daily. No indication at this time for antibiotic. Follow-up with primary physician and/or ENT if symptoms persist.  After history, exam, and medical workup I feel the patient has been appropriately medically screened and is safe for discharge home. Pertinent diagnoses were discussed with the patient. Patient was given return precautions.   Final Clinical Impressions(s) / ED Diagnoses   Final diagnoses:  Left ear pain  Acute effusion of left ear    New Prescriptions Discharge Medication List as of 04/07/2017 12:11 AM    START taking these medications   Details  fluticasone (FLONASE) 50 MCG/ACT nasal spray Place 2 sprays into both nostrils daily., Starting Mon 04/07/2017, Print    ibuprofen (ADVIL,MOTRIN) 600 MG tablet Take 1 tablet (600 mg total)  by mouth every 6 (six) hours as needed., Starting Mon 04/07/2017, Print    sodium chloride (OCEAN) 0.65 % SOLN nasal spray Place 1 spray into both nostrils as needed for congestion., Starting Mon 04/07/2017, Print       I personally performed the services described in this documentation, which was scribed in my presence. The recorded information has been reviewed and is accurate.     Merryl Hacker, MD 04/07/17 [phone]

## 2017-04-06 NOTE — ED Triage Notes (Addendum)
Presents with left ear "crackling and throbbing that began about 5 days ago and is getting worse. She denies pain. Laying down made it worse. Associated with a headache. Denies dizziness, light headedness and nausea

## 2017-04-07 MED ORDER — SALINE SPRAY 0.65 % NA SOLN: 1.0000 | NASAL | 0 refills | Status: DC | PRN

## 2017-04-07 MED ORDER — FLUTICASONE PROPIONATE 50 MCG/ACT NA SUSP: 2.0000 | Freq: Every day | NASAL | 2 refills | Status: DC

## 2017-04-07 MED ORDER — IBUPROFEN 600 MG PO TABS: 600.0000 mg | ORAL_TABLET | Freq: Four times a day (QID) | ORAL | 0 refills | Status: DC | PRN

## 2017-04-07 NOTE — Discharge Instructions (Signed)
You were seen today for left ear pain. You have a slight effusion and mild redness of the left ear. It does not appear acutely infected. Try to increase drainage from her nares with Flonase and nasal saline. Ibuprofen as needed for discomfort. Follow-up with your primary physician or ENT above is symptoms persist. If you develop fevers, hearing loss, or any new or worsening symptoms you should be reevaluated.

## 2017-05-12 ENCOUNTER — Telehealth: Payer: Self-pay | Admitting: Family Medicine

## 2017-05-12 DIAGNOSIS — J4540 Moderate persistent asthma, uncomplicated: Secondary | ICD-10-CM

## 2017-05-12 MED ORDER — ALBUTEROL SULFATE HFA 108 (90 BASE) MCG/ACT IN AERS: 2.0000 | INHALATION_SPRAY | Freq: Four times a day (QID) | RESPIRATORY_TRACT | 2 refills | Status: DC | PRN

## 2017-05-12 NOTE — Telephone Encounter (Signed)
Refill sent in

## 2017-05-12 NOTE — Telephone Encounter (Signed)
Patient requesting refill on albuterol sent to [facility]

## 2017-05-16 ENCOUNTER — Telehealth: Payer: Self-pay | Admitting: Family Medicine

## 2017-05-16 MED ORDER — ALBUTEROL SULFATE HFA 108 (90 BASE) MCG/ACT IN AERS: 2.0000 | INHALATION_SPRAY | Freq: Four times a day (QID) | RESPIRATORY_TRACT | 1 refills | Status: DC | PRN

## 2017-05-16 MED ORDER — ALBUTEROL SULFATE HFA 108 (90 BASE) MCG/ACT IN AERS
2.0000 | INHALATION_SPRAY | Freq: Four times a day (QID) | RESPIRATORY_TRACT | 1 refills | Status: DC | PRN
Start: 2017-05-16 — End: 2017-07-15

## 2017-05-16 NOTE — Telephone Encounter (Signed)
OK to send in, put in comments "OK to substitute whatever rescue inhaler insurance will cover". TY.

## 2017-05-16 NOTE — Telephone Encounter (Signed)
Refill done.  

## 2017-05-16 NOTE — Telephone Encounter (Signed)
Pt says that her insurance will not cover the inhaler that provider sent in to pharmacy. Pt says that it is also the incorrect inhaler. Pt would like to have the albuterol ventalin sent in to her pharmacy instead. Insurance will cover it.

## 2017-06-12 ENCOUNTER — Encounter: Payer: Self-pay | Admitting: Family Medicine

## 2017-06-12 ENCOUNTER — Other Ambulatory Visit (HOSPITAL_COMMUNITY)
Admission: RE | Admit: 2017-06-12 | Discharge: 2017-06-12 | Disposition: A | Discharge status: Home / Self Care | Payer: BLUE CROSS/BLUE SHIELD | Source: Ambulatory Visit | Attending: Family Medicine | Admitting: Family Medicine

## 2017-06-12 ENCOUNTER — Ambulatory Visit (INDEPENDENT_AMBULATORY_CARE_PROVIDER_SITE_OTHER): Payer: BLUE CROSS/BLUE SHIELD | Admitting: Family Medicine

## 2017-06-12 VITALS — BP 120/84 | HR 86 | Temp 99.0°F | Ht 65.0 in | Wt 163.1 lb

## 2017-06-12 DIAGNOSIS — L290 Pruritus ani: Secondary | ICD-10-CM

## 2017-06-12 DIAGNOSIS — B373 Candidiasis of vulva and vagina: Secondary | ICD-10-CM | POA: Diagnosis not present

## 2017-06-12 DIAGNOSIS — B3731 Acute candidiasis of vulva and vagina: Secondary | ICD-10-CM

## 2017-06-12 DIAGNOSIS — K644 Residual hemorrhoidal skin tags: Secondary | ICD-10-CM

## 2017-06-12 LAB — POC URINALSYSI DIPSTICK (AUTOMATED)
Bilirubin, UA: NEGATIVE
Blood, UA: NEGATIVE
Glucose, UA: NEGATIVE
Ketones, UA: NEGATIVE
Leukocytes, UA: NEGATIVE
Nitrite, UA: NEGATIVE
Protein, UA: NEGATIVE
Spec Grav, UA: >=1.03 — AB (ref 1.010–1.025)
Urobilinogen, UA: 0.2 E.U./dL
pH, UA: 6 (ref 5.0–8.0)

## 2017-06-12 MED ORDER — HYDROCORTISONE 2.5 % RE CREA: 1.0000 "application " | TOPICAL_CREAM | Freq: Two times a day (BID) | RECTAL | 0 refills | Status: DC

## 2017-06-12 MED ORDER — FLUCONAZOLE 150 MG PO TABS: 150.0000 mg | ORAL_TABLET | Freq: Once | ORAL | 0 refills | Status: AC

## 2017-06-12 MED FILL — FLUCONAZOLE 150 MG TABLET: 150 | 1 days supply | Qty: 1 | Fill #0

## 2017-06-12 MED FILL — PROCTO-MED HC 2.5% CREAM: 2.5 | 15 days supply | Qty: 30 | Fill #0

## 2017-06-12 NOTE — Progress Notes (Signed)
Chief Complaint  Patient presents with  . Vaginal Itching    vaginal odor    Subjective: Patient is a 41 y.o. female here for vaginal itching/odor.  This issue has been going on for 2-3 days. Hx of yeast infections and this feels similar. No urinary complaints, abd pain, fevers, bleeding, pain, new sexual partners, recent abx use.   1 week of anal itching. Hx of hemorrhoids 3 yrs ago. Has not tried anything at home. No change in BM's, bleeding, skin changes.    ROS: Const: No fevers GU: As noted in HPI  Family History  Problem Relation Age of Onset  . Cancer Father        prostate  . Hypertension Father   . Diabetes Father   . Alcohol abuse Maternal Grandmother   . Alcohol abuse Maternal Grandfather    Past Medical History:  Diagnosis Date  . Asthma   . History of hay fever   . History of migraine   . Moderate persistent asthma 12/20/2016   No Known Allergies  Current Outpatient Prescriptions:  .  albuterol (PROVENTIL HFA;VENTOLIN HFA) 108 (90 Base) MCG/ACT inhaler, Inhale 2 puffs into the lungs every 6 (six) hours as needed for wheezing or shortness of breath., Disp: 1 Inhaler, Rfl: 2 .  albuterol (PROVENTIL) (2.5 MG/3ML) 0.083% nebulizer solution, As directed., Disp: , Rfl:  .  albuterol (VENTOLIN HFA) 108 (90 Base) MCG/ACT inhaler, Inhale 2 puffs into the lungs every 6 (six) hours as needed for wheezing or shortness of breath., Disp: 1 Inhaler, Rfl: 1 .  fluticasone (FLONASE) 50 MCG/ACT nasal spray, Place 2 sprays into both nostrils daily., Disp: 16 g, Rfl: 2 .  fluticasone furoate-vilanterol (BREO ELLIPTA) 100-25 MCG/INH AEPB, USE 1 INHALATION DAILY. Rinse mouth out after use., Disp: 90 each, Rfl: 3 .  ibuprofen (ADVIL,MOTRIN) 600 MG tablet, Take 1 tablet (600 mg total) by mouth every 6 (six) hours as needed., Disp: 30 tablet, Rfl: 0 .  indomethacin (INDOCIN) 50 MG capsule, Take 1 capsule (50 mg total) by mouth 2 (two) times daily with a meal., Disp: 30 capsule, Rfl:  0 .  loratadine (CLARITIN) 10 MG tablet, Take 1 tablet (10 mg total) by mouth daily., Disp: 30 tablet, Rfl: 1 .  montelukast (SINGULAIR) 10 MG tablet, Take 1 tablet (10 mg total) by mouth at bedtime., Disp: 30 tablet, Rfl: 3 .  sodium chloride (OCEAN) 0.65 % SOLN nasal spray, Place 1 spray into both nostrils as needed for congestion., Disp: 480 mL, Rfl: 0 .  fluconazole (DIFLUCAN) 150 MG tablet, Take 1 tablet (150 mg total) by mouth once., Disp: 1 tablet, Rfl: 0 .  hydrocortisone (ANUSOL-HC) 2.5 % rectal cream, Place 1 application rectally 2 (two) times daily., Disp: 30 g, Rfl: 0  Objective: BP 120/84 (BP Location: Left Arm, Patient Position: Sitting, Cuff Size: Normal)   Pulse 86   Temp 99 F (37.2 C) (Oral)   Ht 5\' 5"  (1.651 m)   Wt 163 lb 2 oz (74 kg)   SpO2 99%   BMI 27.15 kg/m  General: Awake, appears stated age Heart: RRR, no murmurs Lungs: CTAB, no rales, wheezes or rhonchi. No accessory muscle use Abd: BS+, soft, NT, ND, no masses or organomegaly Rectal: Large external hemorrhoid on L; No fissures or lesions, no fluctuance or scaling Psych: Age appropriate judgment and insight, normal affect and mood  Assessment and Plan: Yeast vaginitis - Plan: fluconazole (DIFLUCAN) 150 MG tablet, Urine cytology ancillary only, POCT Urinalysis Dipstick (Automated)  Anal pruritus - Plan: hydrocortisone (ANUSOL-HC) 2.5 % rectal cream  External hemorrhoid - Plan: hydrocortisone (ANUSOL-HC) 2.5 % rectal cream, Ambulatory referral to General Surgery  Orders as above. Refer to Gen surg for discussion of hemorrhoid removal per patient request.  F/u prn.  The patient voiced understanding and agreement to the plan.  Spray, DO 06/12/17  10:42 AM

## 2017-06-12 NOTE — Progress Notes (Signed)
Pre visit review using our clinic review tool, if applicable. No additional management support is needed unless otherwise documented below in the visit note. 

## 2017-06-12 NOTE — Patient Instructions (Signed)
If you do not hear anything about your referral in the next 1-2 weeks, call our office and ask for an update.  Let us know if you need anything.  

## 2017-06-13 ENCOUNTER — Telehealth: Payer: Self-pay | Admitting: Family Medicine

## 2017-06-13 LAB — URINE CYTOLOGY ANCILLARY ONLY: Trichomonas: NEGATIVE

## 2017-06-13 NOTE — Telephone Encounter (Signed)
Pt returned call   Please call back before 5 if possible.

## 2017-06-13 NOTE — Telephone Encounter (Signed)
See results

## 2017-06-16 LAB — URINE CYTOLOGY ANCILLARY ONLY: Candida vaginitis: NEGATIVE

## 2017-06-23 ENCOUNTER — Ambulatory Visit: Payer: BLUE CROSS/BLUE SHIELD | Admitting: Family Medicine

## 2017-06-23 DIAGNOSIS — Z0289 Encounter for other administrative examinations: Secondary | ICD-10-CM

## 2017-06-25 ENCOUNTER — Ambulatory Visit: Payer: Self-pay | Admitting: Surgery

## 2017-06-25 NOTE — H&P (View-Only) (Signed)
Kimberly Ali 06/25/2017 2:51 PM Location: [facility] Patient #: [id number] DOB: 1975-09-01 Single / Language: [name] / Race: Black or African American Female  History of Present Illness (Kimberly Ali [name] MD; 06/25/2017 4:21 PM) Patient words: This is a very nice and healthy 41 year old woman is referred for pruritus A9 and concern for external hemorrhoid. She had external hemorrhoids well she was pregnant many years ago. No real issues with them since. She did experience of significant perianal itching recently but no bleeding or pain. Gynecological exam and there is concern for external hemorrhoidal disease and so she was sent here. Her symptoms have improved since then.  The patient is a 41 year old female.   Past Surgical History Malachy Moan, [place]; 06/25/2017 2:51 PM) Hysterectomy (not due to cancer) - Complete  Diagnostic Studies History Malachy Moan, RMA; 06/25/2017 2:51 PM) Colonoscopy never Mammogram within last year Pap Smear 1-5 years ago  Allergies Malachy Moan, RMA; 06/25/2017 2:52 PM) No Known Allergies 06/25/2017  Medication History Malachy Moan, RMA; 06/25/2017 2:52 PM) Ventolin HFA (108 (90 Base)MCG/ACT Aerosol Soln, Inhalation) Active. Hydrocortisone (1% Cream, Rectal) Active. Singulair (10MG  Tablet, Oral) Active. Loratadine (10MG  Tablet, Oral) Active. Medications Reconciled  Social History Malachy Moan, [place]; 06/25/2017 2:51 PM) Alcohol use Occasional alcohol use. Caffeine use Coffee. No drug use Tobacco use Former smoker.  Family History Malachy Moan, [place]; 06/25/2017 2:51 PM) Arthritis Mother. Depression Brother. Diabetes Mellitus Father. Hypertension Father, Mother. Prostate Cancer Father.  Pregnancy / Birth History Malachy Moan, [place]; 06/25/2017 2:51 PM) Age at menarche 19 years. Irregular periods Length (months) of breastfeeding 7-12 Maternal age 42-20 Para 4  Other  Problems Malachy Moan, [place]; 06/25/2017 2:51 PM) Asthma Hemorrhoids Migraine Headache     Review of Systems Malachy Moan RMA; 06/25/2017 2:51 PM) General Not Present- Appetite Loss, Chills, Fatigue, Fever, Night Sweats, Weight Gain and Weight Loss. Skin Present- Dryness and Rash. Not Present- Change in Wart/Mole, Hives, Jaundice, New Lesions, Non-Healing Wounds and Ulcer. HEENT Present- Seasonal Allergies and Wears glasses/contact lenses. Not Present- Earache, Hearing Loss, Hoarseness, Nose Bleed, Oral Ulcers, Ringing in the Ears, Sinus Pain, Sore Throat, Visual Disturbances and Yellow Eyes. Respiratory Present- Snoring and Wheezing. Not Present- Bloody sputum, Chronic Cough and Difficulty Breathing. Cardiovascular Not Present- Chest Pain, Difficulty Breathing Lying Down, Leg Cramps, Palpitations, Rapid Heart Rate, Shortness of Breath and Swelling of Extremities. Gastrointestinal Present- Bloody Stool and Hemorrhoids. Not Present- Abdominal Pain, Bloating, Change in Bowel Habits, Chronic diarrhea, Constipation, Difficulty Swallowing, Excessive gas, Gets full quickly at meals, Indigestion, Nausea, Rectal Pain and Vomiting. Female Genitourinary Not Present- Frequency, Nocturia, Painful Urination, Pelvic Pain and Urgency. Musculoskeletal Present- Back Pain. Not Present- Joint Pain, Joint Stiffness, Muscle Pain, Muscle Weakness and Swelling of Extremities. Neurological Present- Headaches. Not Present- Decreased Memory, Fainting, Numbness, Seizures, Tingling, Tremor, Trouble walking and Weakness. Psychiatric Not Present- Anxiety, Bipolar, Change in Sleep Pattern, Depression, Fearful and Frequent crying. Endocrine Not Present- Cold Intolerance, Excessive Hunger, Hair Changes, Heat Intolerance, Hot flashes and New Diabetes. Hematology Not Present- Blood Thinners, Easy Bruising, Excessive bleeding, Gland problems, HIV and Persistent Infections.  Vitals Malachy Moan RMA; 06/25/2017 2:53  PM) 06/25/2017 2:52 PM Weight: 163.4 lb Height: 65in Body Surface Area: 1.82 m Body Mass Index: 27.19 kg/m  Temp.: 98.26F  Pulse: 97 (Regular)  BP: 122/74 (Sitting, Left Arm, Standard)      Physical Exam (Kaylor Simenson [name] MD; 06/25/2017 4:23 PM)  General Note: Alert and well-appearing  Integumentary Note: Warm and dry  Eye Note:  Anicteric, extraocular motions intact  Chest and Lung Exam Note: Unlabored respirations, symmetrical air entry  Abdomen Note: Soft and nontender, no mass or organomegaly  Rectal Note: There is residual external hemorrhoidal skin more on the right than the left, there is one large pedunculated skin tag on the right side, no ulceration tenderness or other lesions  Neurologic Note: Normal gait, grossly normal  Neuropsychiatric Note: Normal mood and affect, appropriate insight  Musculoskeletal Note: Strength symmetrical throughout, no deformity    Assessment & Plan (Anniece Bleiler [name] MD; 06/25/2017 4:24 PM)  ANAL SKIN TAG (K64.4) Story: She desires excision. We'll perform examination anesthesia and excision of the large pedunculated skin tag. Discussed risks of bleeding, infection, pain, scarring. She expressed understanding. All questions were answered. We will get her scheduled on the coming weeks.

## 2017-06-25 NOTE — H&P (Signed)
Kimberly Ali 06/25/2017 2:51 PM Location: [facility] Patient #: [id number] DOB: [birth year] Single / Language: Kimberly Ali / Race: Black or African American Female  History of Present Illness (Kimberly Ali [name] MD; 06/25/2017 4:21 PM) Patient words: This is a very nice and healthy 41 year old woman is referred for pruritus A9 and concern for external hemorrhoid. She had external hemorrhoids well she was pregnant many years ago. No real issues with them since. She did experience of significant perianal itching recently but no bleeding or pain. Gynecological exam and there is concern for external hemorrhoidal disease and so she was sent here. Her symptoms have improved since then.  The patient is a 41 year old female.   Past Surgical History Kimberly Ali, [place]; 06/25/2017 2:51 PM) Hysterectomy (not due to cancer) - Complete  Diagnostic Studies History Kimberly Ali, RMA; 06/25/2017 2:51 PM) Colonoscopy never Mammogram within last year Pap Smear 1-5 years ago  Allergies Kimberly Ali, RMA; 06/25/2017 2:52 PM) No Known Allergies 06/25/2017  Medication History Kimberly Ali, RMA; 06/25/2017 2:52 PM) Ventolin HFA (108 (90 Base)MCG/ACT Aerosol Soln, Inhalation) Active. Hydrocortisone (1% Cream, Rectal) Active. Singulair (10MG  Tablet, Oral) Active. Loratadine (10MG  Tablet, Oral) Active. Medications Reconciled  Social History Kimberly Ali, [place]; 06/25/2017 2:51 PM) Alcohol use Occasional alcohol use. Caffeine use Coffee. No drug use Tobacco use Former smoker.  Family History Kimberly Ali, [place]; 06/25/2017 2:51 PM) Arthritis Mother. Depression Brother. Diabetes Mellitus Father. Hypertension Father, Mother. Prostate Cancer Father.  Pregnancy / Birth History Kimberly Ali, [place]; 06/25/2017 2:51 PM) Age at menarche 64 years. Irregular periods Length (months) of breastfeeding 7-12 Maternal age 77-20 Para 4  Other  Problems Kimberly Ali, [place]; 06/25/2017 2:51 PM) Asthma Hemorrhoids Migraine Headache     Review of Systems Kimberly Ali RMA; 06/25/2017 2:51 PM) General Not Present- Appetite Loss, Chills, Fatigue, Fever, Night Sweats, Weight Gain and Weight Loss. Skin Present- Dryness and Rash. Not Present- Change in Wart/Mole, Hives, Jaundice, New Lesions, Non-Healing Wounds and Ulcer. HEENT Present- Seasonal Allergies and Wears glasses/contact lenses. Not Present- Earache, Hearing Loss, Hoarseness, Nose Bleed, Oral Ulcers, Ringing in the Ears, Sinus Pain, Sore Throat, Visual Disturbances and Yellow Eyes. Respiratory Present- Snoring and Wheezing. Not Present- Bloody sputum, Chronic Cough and Difficulty Breathing. Cardiovascular Not Present- Chest Pain, Difficulty Breathing Lying Down, Leg Cramps, Palpitations, Rapid Heart Rate, Shortness of Breath and Swelling of Extremities. Gastrointestinal Present- Bloody Stool and Hemorrhoids. Not Present- Abdominal Pain, Bloating, Change in Bowel Habits, Chronic diarrhea, Constipation, Difficulty Swallowing, Excessive gas, Gets full quickly at meals, Indigestion, Nausea, Rectal Pain and Vomiting. Female Genitourinary Not Present- Frequency, Nocturia, Painful Urination, Pelvic Pain and Urgency. Musculoskeletal Present- Back Pain. Not Present- Joint Pain, Joint Stiffness, Muscle Pain, Muscle Weakness and Swelling of Extremities. Neurological Present- Headaches. Not Present- Decreased Memory, Fainting, Numbness, Seizures, Tingling, Tremor, Trouble walking and Weakness. Psychiatric Not Present- Anxiety, Bipolar, Change in Sleep Pattern, Depression, Fearful and Frequent crying. Endocrine Not Present- Cold Intolerance, Excessive Hunger, Hair Changes, Heat Intolerance, Hot flashes and New Diabetes. Hematology Not Present- Blood Thinners, Easy Bruising, Excessive bleeding, Gland problems, HIV and Persistent Infections.  Vitals Kimberly Ali RMA; 06/25/2017 2:53  PM) 06/25/2017 2:52 PM Weight: 163.4 lb Height: 65in Body Surface Area: 1.82 m Body Mass Index: 27.19 kg/m  Temp.: 98.59F  Pulse: 97 (Regular)  BP: 122/74 (Sitting, Left Arm, Standard)      Physical Exam (Gr[place]en [place]yard [name] MD; 06/25/2017 4:23 PM)  General Note: Alert and well-appearing  Integumentary Note: Warm and dry  Eye Note:  Anicteric, extraocular motions intact  Chest and Lung Exam Note: Unlabored respirations, symmetrical air entry  Abdomen Note: Soft and nontender, no mass or organomegaly  Rectal Note: There is residual external hemorrhoidal skin more on the right than the left, there is one large pedunculated skin tag on the right side, no ulceration tenderness or other lesions  Neurologic Note: Normal gait, grossly normal  Neuropsychiatric Note: Normal mood and affect, appropriate insight  Musculoskeletal Note: Strength symmetrical throughout, no deformity    Assessment & Plan (Kimberly Ali [name] MD; 06/25/2017 4:24 PM)  ANAL SKIN TAG (K64.4) Story: She desires excision. We'll perform examination anesthesia and excision of the large pedunculated skin tag. Discussed risks of bleeding, infection, pain, scarring. She expressed understanding. All questions were answered. We will get her scheduled on the coming weeks.

## 2017-07-15 ENCOUNTER — Other Ambulatory Visit: Payer: Self-pay

## 2017-07-15 ENCOUNTER — Encounter (HOSPITAL_BASED_OUTPATIENT_CLINIC_OR_DEPARTMENT_OTHER): Payer: Self-pay | Admitting: *Deleted

## 2017-07-23 ENCOUNTER — Encounter (HOSPITAL_BASED_OUTPATIENT_CLINIC_OR_DEPARTMENT_OTHER)
Admission: RE | Disposition: A | Discharge status: Home / Self Care | Payer: Self-pay | Source: Ambulatory Visit | Attending: Surgery

## 2017-07-23 ENCOUNTER — Ambulatory Visit (HOSPITAL_BASED_OUTPATIENT_CLINIC_OR_DEPARTMENT_OTHER): Payer: BLUE CROSS/BLUE SHIELD | Admitting: Certified Registered Nurse Anesthetist

## 2017-07-23 ENCOUNTER — Ambulatory Visit (HOSPITAL_BASED_OUTPATIENT_CLINIC_OR_DEPARTMENT_OTHER)
Admission: RE | Admit: 2017-07-23 | Discharge: 2017-07-23 | Disposition: A | Discharge status: Home / Self Care | Payer: BLUE CROSS/BLUE SHIELD | Source: Ambulatory Visit | Attending: Surgery | Admitting: Surgery

## 2017-07-23 ENCOUNTER — Other Ambulatory Visit: Payer: Self-pay

## 2017-07-23 ENCOUNTER — Encounter (HOSPITAL_BASED_OUTPATIENT_CLINIC_OR_DEPARTMENT_OTHER): Payer: Self-pay | Admitting: Certified Registered Nurse Anesthetist

## 2017-07-23 DIAGNOSIS — Z90710 Acquired absence of both cervix and uterus: Secondary | ICD-10-CM | POA: Insufficient documentation

## 2017-07-23 DIAGNOSIS — J45909 Unspecified asthma, uncomplicated: Secondary | ICD-10-CM | POA: Insufficient documentation

## 2017-07-23 DIAGNOSIS — K644 Residual hemorrhoidal skin tags: Secondary | ICD-10-CM | POA: Insufficient documentation

## 2017-07-23 DIAGNOSIS — K640 First degree hemorrhoids: Secondary | ICD-10-CM | POA: Diagnosis not present

## 2017-07-23 DIAGNOSIS — Z87891 Personal history of nicotine dependence: Secondary | ICD-10-CM | POA: Insufficient documentation

## 2017-07-23 HISTORY — DX: Headache, unspecified: R51.9

## 2017-07-23 HISTORY — PX: EXCISION OF SKIN TAG: SHX6270

## 2017-07-23 HISTORY — DX: Headache: R51

## 2017-07-23 HISTORY — DX: Residual hemorrhoidal skin tags: K64.4

## 2017-07-23 SURGERY — EXAM UNDER ANESTHESIA
Anesthesia: Monitor Anesthesia Care | Site: Anus

## 2017-07-23 MED ORDER — FENTANYL CITRATE (PF) 100 MCG/2ML IJ SOLN: 50.0000 ug | INTRAMUSCULAR | Status: DC | PRN

## 2017-07-23 MED ORDER — MIDAZOLAM HCL 2 MG/2ML IJ SOLN
INTRAMUSCULAR | Status: AC
  Filled 2017-07-23: qty 2

## 2017-07-23 MED ORDER — DIBUCAINE 1 % RE OINT
TOPICAL_OINTMENT | RECTAL | Status: AC
  Filled 2017-07-23: qty 28

## 2017-07-23 MED ORDER — METOCLOPRAMIDE HCL 5 MG/ML IJ SOLN: 10.0000 mg | Freq: Once | INTRAMUSCULAR | Status: DC | PRN

## 2017-07-23 MED ORDER — GABAPENTIN 300 MG PO CAPS
300.0000 mg | ORAL_CAPSULE | ORAL | Status: AC
  Administered 2017-07-23: 300 mg via ORAL

## 2017-07-23 MED ORDER — SCOPOLAMINE 1 MG/3DAYS TD PT72: 1.0000 | MEDICATED_PATCH | Freq: Once | TRANSDERMAL | Status: DC | PRN

## 2017-07-23 MED ORDER — FENTANYL CITRATE (PF) 100 MCG/2ML IJ SOLN
INTRAMUSCULAR | Status: AC
  Filled 2017-07-23: qty 2

## 2017-07-23 MED ORDER — LACTATED RINGERS IV SOLN: INTRAVENOUS | Status: DC

## 2017-07-23 MED ORDER — FENTANYL CITRATE (PF) 100 MCG/2ML IJ SOLN: 25.0000 ug | INTRAMUSCULAR | Status: DC | PRN

## 2017-07-23 MED ORDER — CELECOXIB 200 MG PO CAPS
ORAL_CAPSULE | ORAL | Status: AC
  Filled 2017-07-23: qty 1

## 2017-07-23 MED ORDER — BUPIVACAINE-EPINEPHRINE (PF) 0.25% -1:200000 IJ SOLN
INTRAMUSCULAR | Status: AC
  Filled 2017-07-23: qty 30

## 2017-07-23 MED ORDER — CEFAZOLIN SODIUM-DEXTROSE 2-4 GM/100ML-% IV SOLN
INTRAVENOUS | Status: AC
  Filled 2017-07-23: qty 100

## 2017-07-23 MED ORDER — MIDAZOLAM HCL 5 MG/5ML IJ SOLN
INTRAMUSCULAR | Status: DC | PRN
  Administered 2017-07-23: 2 mg via INTRAVENOUS

## 2017-07-23 MED ORDER — ONDANSETRON HCL 4 MG/2ML IJ SOLN
INTRAMUSCULAR | Status: AC
  Filled 2017-07-23: qty 2

## 2017-07-23 MED ORDER — MIDAZOLAM HCL 2 MG/2ML IJ SOLN: 1.0000 mg | INTRAMUSCULAR | Status: DC | PRN

## 2017-07-23 MED ORDER — BUPIVACAINE-EPINEPHRINE (PF) 0.25% -1:200000 IJ SOLN
INTRAMUSCULAR | Status: DC | PRN
  Administered 2017-07-23: 15 mL via PERINEURAL

## 2017-07-23 MED ORDER — CEFAZOLIN SODIUM-DEXTROSE 2-4 GM/100ML-% IV SOLN
2.0000 g | INTRAVENOUS | Status: AC
  Administered 2017-07-23: 2 g via INTRAVENOUS

## 2017-07-23 MED ORDER — ACETAMINOPHEN 500 MG PO TABS
ORAL_TABLET | ORAL | Status: AC
  Filled 2017-07-23: qty 2

## 2017-07-23 MED ORDER — DOCUSATE SODIUM 100 MG PO CAPS: 100.0000 mg | ORAL_CAPSULE | Freq: Two times a day (BID) | ORAL | 0 refills | Status: AC

## 2017-07-23 MED ORDER — LACTATED RINGERS IV SOLN
INTRAVENOUS | Status: DC
  Administered 2017-07-23: 10:00:00 via INTRAVENOUS

## 2017-07-23 MED ORDER — CHLORHEXIDINE GLUCONATE CLOTH 2 % EX PADS: 6.0000 | MEDICATED_PAD | Freq: Once | CUTANEOUS | Status: DC

## 2017-07-23 MED ORDER — PROPOFOL 10 MG/ML IV BOLUS
INTRAVENOUS | Status: DC | PRN
  Administered 2017-07-23: 20 mg via INTRAVENOUS
  Administered 2017-07-23: 10 mg via INTRAVENOUS
  Administered 2017-07-23: 20 mg via INTRAVENOUS

## 2017-07-23 MED ORDER — ONDANSETRON HCL 4 MG/2ML IJ SOLN
INTRAMUSCULAR | Status: DC | PRN
  Administered 2017-07-23: 4 mg via INTRAVENOUS

## 2017-07-23 MED ORDER — GABAPENTIN 300 MG PO CAPS
ORAL_CAPSULE | ORAL | Status: AC
  Filled 2017-07-23: qty 1

## 2017-07-23 MED ORDER — MEPERIDINE HCL 25 MG/ML IJ SOLN: 6.2500 mg | INTRAMUSCULAR | Status: DC | PRN

## 2017-07-23 MED ORDER — OXYCODONE-ACETAMINOPHEN 5-325 MG PO TABS: 1.0000 | ORAL_TABLET | Freq: Four times a day (QID) | ORAL | 0 refills | Status: DC | PRN

## 2017-07-23 MED ORDER — ACETAMINOPHEN 500 MG PO TABS
1000.0000 mg | ORAL_TABLET | ORAL | Status: AC
  Administered 2017-07-23: 1000 mg via ORAL

## 2017-07-23 MED ORDER — DEXAMETHASONE SODIUM PHOSPHATE 10 MG/ML IJ SOLN
INTRAMUSCULAR | Status: AC
  Filled 2017-07-23: qty 1

## 2017-07-23 MED ORDER — PROPOFOL 500 MG/50ML IV EMUL
INTRAVENOUS | Status: DC | PRN
  Administered 2017-07-23: 75 ug/kg/min via INTRAVENOUS

## 2017-07-23 MED ORDER — CELECOXIB 200 MG PO CAPS
200.0000 mg | ORAL_CAPSULE | ORAL | Status: AC
  Administered 2017-07-23: 200 mg via ORAL

## 2017-07-23 MED ORDER — LIDOCAINE HCL 2 % EX GEL
CUTANEOUS | Status: AC
  Filled 2017-07-23: qty 5

## 2017-07-23 MED ORDER — FENTANYL CITRATE (PF) 100 MCG/2ML IJ SOLN
INTRAMUSCULAR | Status: DC | PRN
  Administered 2017-07-23: 25 ug via INTRAVENOUS

## 2017-07-23 SURGICAL SUPPLY — 40 items
BLADE HEX COATED 2.75 (ELECTRODE) ×2 IMPLANT
BLADE SURG 11 STRL SS (BLADE) IMPLANT
BLADE SURG 15 STRL LF DISP TIS (BLADE) ×1 IMPLANT
BLADE SURG 15 STRL SS (BLADE) ×1
BRIEF STRETCH FOR OB PAD LRG (UNDERPADS AND DIAPERS) IMPLANT
CANISTER SUCT 1200ML W/VALVE (MISCELLANEOUS) IMPLANT
COVER BACK TABLE 60X90IN (DRAPES) ×2 IMPLANT
COVER MAYO STAND STRL (DRAPES) ×2 IMPLANT
DECANTER SPIKE VIAL GLASS SM (MISCELLANEOUS) IMPLANT
DRAPE LAPAROTOMY 100X72 PEDS (DRAPES) ×2 IMPLANT
DRAPE UTILITY XL STRL (DRAPES) ×2 IMPLANT
DRSG PAD ABDOMINAL 8X10 ST (GAUZE/BANDAGES/DRESSINGS) IMPLANT
ELECT REM PT RETURN 9FT ADLT (ELECTROSURGICAL) ×2
ELECTRODE REM PT RTRN 9FT ADLT (ELECTROSURGICAL) ×1 IMPLANT
GAUZE SPONGE 4X4 12PLY STRL LF (GAUZE/BANDAGES/DRESSINGS) IMPLANT
GAUZE SPONGE 4X4 16PLY XRAY LF (GAUZE/BANDAGES/DRESSINGS) IMPLANT
GLOVE BIO SURGEON STRL SZ 6 (GLOVE) ×2 IMPLANT
GLOVE BIOGEL PI IND STRL 6.5 (GLOVE) ×1 IMPLANT
GLOVE BIOGEL PI INDICATOR 6.5 (GLOVE) ×1
GOWN STRL REUS W/ TWL LRG LVL3 (GOWN DISPOSABLE) ×2 IMPLANT
GOWN STRL REUS W/TWL LRG LVL3 (GOWN DISPOSABLE) ×2
NEEDLE HYPO 25X1 1.5 SAFETY (NEEDLE) ×2 IMPLANT
NS IRRIG 1000ML POUR BTL (IV SOLUTION) IMPLANT
PACK BASIN DAY SURGERY FS (CUSTOM PROCEDURE TRAY) ×2 IMPLANT
PACK LITHOTOMY IV (CUSTOM PROCEDURE TRAY) IMPLANT
PENCIL BUTTON HOLSTER BLD 10FT (ELECTRODE) ×2 IMPLANT
SHEARS HARMONIC 9CM CVD (BLADE) IMPLANT
SLEEVE SCD COMPRESS KNEE MED (MISCELLANEOUS) IMPLANT
SPONGE HEMORRHOID 8X3CM (HEMOSTASIS) IMPLANT
SPONGE LAP 4X18 X RAY DECT (DISPOSABLE) ×2 IMPLANT
SPONGE SURGIFOAM ABS GEL 100 (HEMOSTASIS) IMPLANT
SURGILUBE 2OZ TUBE FLIPTOP (MISCELLANEOUS) ×2 IMPLANT
SUT CHROMIC 3 0 SH 27 (SUTURE) IMPLANT
SYR CONTROL 10ML LL (SYRINGE) ×2 IMPLANT
TOWEL OR 17X24 6PK STRL BLUE (TOWEL DISPOSABLE) ×2 IMPLANT
TOWEL OR NON WOVEN STRL DISP B (DISPOSABLE) ×2 IMPLANT
TRAY DSU PREP LF (CUSTOM PROCEDURE TRAY) ×2 IMPLANT
TUBE CONNECTING 20X1/4 (TUBING) ×2 IMPLANT
UNDERPAD 30X30 (UNDERPADS AND DIAPERS) ×2 IMPLANT
YANKAUER SUCT BULB TIP NO VENT (SUCTIONS) IMPLANT

## 2017-07-23 NOTE — Anesthesia Preprocedure Evaluation (Addendum)
Anesthesia Evaluation  Patient identified by MRN, date of birth, ID band Patient awake    Reviewed: Allergy & Precautions, NPO status , Patient's Chart, lab work & pertinent test results  Airway Mallampati: II  TM Distance: >3 FB Neck ROM: Full    Dental no notable dental hx.    Pulmonary asthma ,    Pulmonary exam normal breath sounds clear to auscultation       Cardiovascular negative cardio ROS Normal cardiovascular exam Rhythm:Regular Rate:Normal     Neuro/Psych negative neurological ROS  negative psych ROS   GI/Hepatic negative GI ROS, Neg liver ROS,   Endo/Other  negative endocrine ROS  Renal/GU negative Renal ROS  negative genitourinary   Musculoskeletal negative musculoskeletal ROS (+)   Abdominal   Peds negative pediatric ROS (+)  Hematology negative hematology ROS (+)   Anesthesia Other Findings   Reproductive/Obstetrics negative OB ROS                             Anesthesia Physical Anesthesia Plan  ASA: II  Anesthesia Plan: MAC   Post-op Pain Management:    Induction: Intravenous  PONV Risk Score and Plan: 3 and Ondansetron, Dexamethasone, Treatment may vary due to age or medical condition and Midazolam  Airway Management Planned: Simple Face Mask  Additional Equipment:   Intra-op Plan:   Post-operative Plan:   Informed Consent: I have reviewed the patients History and Physical, chart, labs and discussed the procedure including the risks, benefits and alternatives for the proposed anesthesia with the patient or authorized representative who has indicated his/her understanding and acceptance.   Dental advisory given  Plan Discussed with: CRNA  Anesthesia Plan Comments:        Anesthesia Quick Evaluation

## 2017-07-23 NOTE — Transfer of Care (Signed)
Immediate Anesthesia Transfer of Care Note  Patient: Kimberly Ali  Procedure(s) Performed: EXAM UNDER ANESTHESIA (N/A Anus) EXCISION OF SKIN TAG (N/A Anus)  Patient Location: PACU  Anesthesia Type:MAC  Level of Consciousness: awake, alert  and oriented  Airway & Oxygen Therapy: Patient Spontanous Breathing and Patient connected to face mask oxygen  Post-op Assessment: Report given to RN and Post -op Vital signs reviewed and stable  Post vital signs: Reviewed and stable  Last Vitals:  Vitals:   07/23/17 0940 07/23/17 1044  BP: 133/90   Pulse: 73 94  Resp: 18   Temp: 36.9 C   SpO2: 100% 100%    Last Pain:  Vitals:   07/23/17 1000  TempSrc:   PainSc: 0-No pain      Patients Stated Pain Goal: 3 (69/67/89 3810)  Complications: No apparent anesthesia complications

## 2017-07-23 NOTE — Discharge Instructions (Signed)
ANORECTAL SURGERY:  POST OPERATIVE INSTRUCTIONS  ######################################################################  EAT Start with a pureed / full liquid diet After 24 hours, gradually transition to a high fiber diet.    CONTROL PAIN Control pain so you can tolerate bowel movements,  walk, sleep, tolerate sneezing/coughing, and go up/down stairs.   HAVE A BOWEL MOVEMENT DAILY Keep your bowels regular to avoid problems.   Taking a fiber supplement every day to keep bowels soft.   Try a laxative to override constipation. Use an antidairrheal to slow down diarrhea.   Call if not better after 2 tries  WALK Walk an hour a day.  Control your pain to do that.   CALL IF YOU HAVE PROBLEMS/CONCERNS Call if you are still struggling despite following these instructions. Call if you have concerns not answered by these instructions  ######################################################################    1. Take your usually prescribed home medications unless otherwise directed. 2. DIET: Follow a light bland diet the first 24 hours after arrival home, such as soup, liquids, crackers, etc.  Be sure to include lots of fluids daily.  Avoid fast food or heavy meals as your are more likely to get nauseated.  Eat a low fat the next few days after surgery.   3. PAIN CONTROL: a. Pain is best controlled by a usual combination of three different methods TOGETHER: i. Ice/Heat ii. Over the counter pain medication iii. Prescription pain medication b. Expect swelling and discomfort in the anus/rectal area.  Warm water baths (30-60 minutes up to 6 times a day, especially after bowel meovements) will help. Use ice for the first few days to help decrease swelling and bruising, then switch to heat such as warm towels, sitz baths, warm baths, etc to help relax tight/sore spots and speed recovery.  Some people prefer to use ice alone, heat alone, alternating between ice & heat.  Experiment to what works  for you.   c. It is helpful to take an over-the-counter pain medication regularly for the first few weeks.  Choose one of the following that works best for you: i. Naproxen (Aleve, etc)  Two 220mg  tabs twice a day ii. Ibuprofen (Advil, etc) Three 200mg  tabs four times a day (every meal & bedtime) iii. Acetaminophen (Tylenol, etc) 500-650mg  four times a day (every meal & bedtime) d. A  prescription for pain medication (such as oxycodone, hydrocodone, etc) should be given to you upon discharge.  Take your pain medication as prescribed.  i. If you are having problems/concerns with the prescription medicine (does not control pain, nausea, vomiting, rash, itching, etc), please call us [phone] to see if we need to switch you to a different pain medicine that will work better for you and/or control your side effect better. ii. If you need a refill on your pain medication, please contact your pharmacy.  They will contact our office to request authorization. Prescriptions will not be filled after 5 pm or on week-ends.  Use a Sitz Bath 4-8 times a day for relief   [employer] A sitz bath is a warm water bath taken in the sitting position that covers only the hips and buttocks. It may be used for either healing or hygiene purposes. Sitz baths are also used to relieve pain, itching, or muscle spasms. The water may contain medicine. Moist heat will help you heal and relax.  HOME CARE INSTRUCTIONS  Take 3 to 4 sitz baths a day. 1. Fill the bathtub half full with warm water. 2. Sit in the  water and open the drain a little. 3. Turn on the warm water to keep the tub half full. Keep the water running constantly. 4. Soak in the water for 15 to 20 minutes. 5. After the sitz bath, pat the affected area dry first.   4. KEEP YOUR BOWELS REGULAR a. The goal is one bowel movement a day b. Avoid getting constipated.  Between the surgery and the pain medications, it is common to experience some constipation.   Increasing fluid intake and taking a fiber supplement (such as Metamucil, Citrucel, FiberCon, MiraLax, etc) 2-3 times a day regularly will usually help prevent this problem from occurring.  A mild laxative (prune juice, Milk of Magnesia, MiraLax, etc) should be taken according to package directions if there are no bowel movements after 48 hours. c. Watch out for diarrhea.  If you have many loose bowel movements, simplify your diet to bland foods & liquids for a few days.  Stop any stool softeners and decrease your fiber supplement.  Switching to mild anti-diarrheal medications (Kayopectate, Pepto Bismol) can help.  If this worsens or does not improve, please call us.  5. Wound Care  a. Remove your bandages with your first bowel movement, usually the day after surgery.  You may have packing if you had an abscess.  Let any packing or gauze fall come out.   b. Wear an absorbent pad or soft cotton balls in your underwear as needed to catch any drainage and help keep the area  c. Keep the area clean and dry.  Bathe / shower every day.  Keep the area clean by showering / bathing over the incision / wound.   It is okay to soak an open wound to help wash it.  Consider using a squeeze bottle filled with warm water to gently wash the anal area.  Wet wipes or showers / gentle washing after bowel movements is often less traumatic than regular toilet paper. d. Dennis Bast will often notice bleeding with bowel movements.  This should slow down by the end of the first week of surgery.  Sitting on an ice pack can help. e. Expect some drainage.  This should slow down by the end of the first week of surgery, but you will have occasional bleeding or drainage up to a few months after surgery.  Wear an absorbent pad or soft cotton gauze in your underwear until the drainage stops.  6. ACTIVITIES as tolerated:   a. You may resume regular (light) daily activities beginning the next day--such as daily self-care, walking, climbing  stairs--gradually increasing activities as tolerated.  If you can walk 30 minutes without difficulty, it is safe to try more intense activity such as jogging, treadmill, bicycling, low-impact aerobics, swimming, etc. b. Save the most intensive and strenuous activity for last such as sit-ups, heavy lifting, contact sports, etc  Refrain from any heavy lifting or straining until you are off narcotics for pain control.   c. DO NOT PUSH THROUGH PAIN.  Let pain be your guide: If it hurts to do something, don't do it.  Pain is your body warning you to avoid that activity for another week until the pain goes down. d. You may drive when you are no longer taking prescription pain medication, you can comfortably sit for long periods of time, and you can safely maneuver your car and apply brakes. e. Dennis Bast may have sexual intercourse when it is comfortable.  7. FOLLOW UP in our office a. Please call [facility] at [phone] to set up an appointment to see your surgeon in the office for a follow-up appointment approximately 2-3 weeks after your surgery. b. Make sure that you call for this appointment the day you arrive home to ensure a convenient appointment time.  8. IF YOU HAVE DISABILITY OR FAMILY LEAVE FORMS, BRING THEM TO THE OFFICE FOR PROCESSING.  DO NOT GIVE THEM TO YOUR DOCTOR.        WHEN TO CALL us [phone]: 1. Poor pain control 2. Reactions / problems with new medications (rash/itching, nausea, etc)  3. Fever over 101.5 F (38.5 C) 4. Inability to urinate 5. Nausea and/or vomiting 6. Worsening swelling or bruising 7. Continued bleeding from incision. 8. Increased pain, redness, or drainage from the incision  The clinic staff is available to answer your questions during regular business hours (8:30am-5pm).  Please dont hesitate to call and ask to speak to one of our nurses for clinical concerns.   A surgeon from [facility] is always on call at the hospitals   If you have a  medical emergency, go to the nearest emergency room or call 911.    [facility], [address], [place], [place],   [address] ? MAIN: [phone]) [phone] ? TOLL FREE: [phone] ? FAX [phone] [email or web address]       Post Anesthesia Home Care Instructions  Activity: Get plenty of rest for the remainder of the day. A responsible individual must stay with you for 24 hours following the procedure.  For the next 24 hours, DO NOT: -Drive a car -Paediatric nurse -Drink alcoholic beverages -Take any medication unless instructed by your physician -Make any legal decisions or sign important papers.  Meals: Start with liquid foods such as gelatin or soup. Progress to regular foods as tolerated. Avoid greasy, spicy, heavy foods. If nausea and/or vomiting occur, drink only clear liquids until the nausea and/or vomiting subsides. Call your physician if vomiting continues.  Special Instructions/Symptoms: Your throat may feel dry or sore from the anesthesia or the breathing tube placed in your throat during surgery. If this causes discomfort, gargle with warm salt water. The discomfort should disappear within 24 hours.  If you had a scopolamine patch placed behind your ear for the management of post- operative nausea and/or vomiting:  1. The medication in the patch is effective for 72 hours, after which it should be removed.  Wrap patch in a tissue and discard in the trash. Wash hands thoroughly with soap and water. 2. You may remove the patch earlier than 72 hours if you experience unpleasant side effects which may include dry mouth, dizziness or visual disturbances. 3. Avoid touching the patch. Wash your hands with soap and water after contact with the patch.

## 2017-07-23 NOTE — Interval H&P Note (Signed)
History and Physical Interval Note:  07/23/2017 9:44 AM  Kimberly Ali  has presented today for surgery, with the diagnosis of ANAL SKIN TAG  The various methods of treatment have been discussed with the patient and family. After consideration of risks, benefits and other options for treatment, the patient has consented to  Procedure(s): EXAM UNDER ANESTHESIA (N/A) EXCISION OF SKIN TAG (N/A) as a surgical intervention .  The patient's history has been reviewed, patient examined, no change in status, stable for surgery.  I have reviewed the patient's chart and labs.  Questions were answered to the patient's satisfaction.     Glena Pharris Rich Brave

## 2017-07-23 NOTE — Op Note (Signed)
Operative Note  Kimberly Ali  [id number]  [id number]  07/23/2017   Surgeon: Vikki Ports A ConnorMD  Assistant: OR staff  Procedure performed: Rectal exam under anesthesia, excision of anal skin tag  Preop diagnosis: anal skin tag Post-op diagnosis/intraop findings: same  Specimens: anal skin tag Retained items: no EBL: minimal cc Complications: none  Description of procedure: After obtaining informed consent the patient was taken to the operating room and placed prone on the table and MAC was initiated, preoperative antibiotics were administered, SCDs applied, and a formal timeout was performed. The perineum was prepped and draped in the usual sterile fashion. A digital exam was performed revealing no mass or bleeding. An anal block was performed with 0.25% marcaine with epinephrine and then the rectal retractor was inserted and the mucosa inspected. There was grade 1 internal hemorrhoidal disease in the posterior and left lateral field without ulceration or bleeding. Redundant hemorrhoidal skin was noted posteriorly. There was a large (1x2cm) pedunculated skin tag in the left lateral field associated with underlying redundant hemorrhoidal skin. Using the cautery, the skin tag was excised at its base and the wound cauterized for hemostasis. This concluded the procedure.  The patient was then awakened and taken to PACU in stable condition.   All counts were correct at the completion of the case.

## 2017-07-24 ENCOUNTER — Encounter (HOSPITAL_BASED_OUTPATIENT_CLINIC_OR_DEPARTMENT_OTHER): Payer: Self-pay | Admitting: Surgery

## 2017-07-24 NOTE — Anesthesia Postprocedure Evaluation (Signed)
Anesthesia Post Note  Patient: Kimberly Ali  Procedure(s) Performed: EXAM UNDER ANESTHESIA (N/A Anus) EXCISION OF SKIN TAG (N/A Anus)     Patient location during evaluation: PACU Anesthesia Type: MAC Level of consciousness: awake and alert Pain management: pain level controlled Vital Signs Assessment: post-procedure vital signs reviewed and stable Respiratory status: spontaneous breathing, nonlabored ventilation, respiratory function stable and patient connected to nasal cannula oxygen Cardiovascular status: stable and blood pressure returned to baseline Postop Assessment: no apparent nausea or vomiting Anesthetic complications: no    Last Vitals:  Vitals:   07/23/17 1145 07/23/17 1200  BP: 117/90 (!) 152/96  Pulse: 71 84  Resp: 16 16  Temp:  36.6 C  SpO2: 99% 99%    Last Pain:  Vitals:   07/23/17 1200  TempSrc:   PainSc: 0-No pain   Pain Goal: Patients Stated Pain Goal: 3 (07/23/17 1054)               Montez Hageman

## 2017-07-27 ENCOUNTER — Telehealth: Payer: Self-pay | Admitting: Surgery

## 2017-07-27 NOTE — Telephone Encounter (Signed)
Kimberly Ali had a hemorrhoid/anal tag excised on 07/23/2017 by Dr. Kae Heller.  She has had some bleeding from the incision. She is doing sitz baths.    I reassured her.  If the bleeding continues over several days, she should call the office.  Alphonsa Overall, MD, [facility] Pager: [phone] Office phone:  [phone]

## 2017-08-14 ENCOUNTER — Telehealth: Payer: Self-pay | Admitting: Family Medicine

## 2017-08-14 DIAGNOSIS — J4540 Moderate persistent asthma, uncomplicated: Secondary | ICD-10-CM

## 2017-08-14 NOTE — Telephone Encounter (Signed)
Copied from [place]. Topic: Quick Communication - See Telephone Encounter >> Aug 14, 2017  4:19 PM Bea Graff, NT wrote: CRM for notification. See Telephone encounter for: Pt needing refill of albuterol inhaler and her Claritin called into [facility] on [address]  08/14/17.

## 2017-08-15 MED ORDER — LORATADINE 10 MG PO TABS: 10.0000 mg | ORAL_TABLET | Freq: Every day | ORAL | 1 refills | Status: DC

## 2017-08-15 MED ORDER — ALBUTEROL SULFATE HFA 108 (90 BASE) MCG/ACT IN AERS: 2.0000 | INHALATION_SPRAY | Freq: Four times a day (QID) | RESPIRATORY_TRACT | 2 refills | Status: DC | PRN

## 2017-08-15 NOTE — Telephone Encounter (Signed)
Well visit with Dr. Nani Ravens on 12/20/16 / Medication refill for albuterol inhaler and claritin / Refilled per protcol  Disp Refills Start End   albuterol (PROVENTIL HFA;VENTOLIN HFA) 108 (90 Base) MCG/ACT inhaler 1 Inhaler 2 08/15/2017    Sig - Route: Inhale 2 puffs into the lungs every 6 (six) hours as needed for wheezing or shortness of breath. - Inhalation   Sent to pharmacy as: albuterol (PROVENTIL HFA;VENTOLIN HFA) 108 (90 Base) MCG/ACT inhaler   E-Prescribing Status: Receipt confirmed by pharmacy (08/15/2017 9:20 AM EST)     Disp Refills Start End   loratadine (CLARITIN) 10 MG tablet 30 tablet 1 08/15/2017    Sig - Route: Take 1 tablet (10 mg total) by mouth daily. - Oral   Sent to pharmacy as: loratadine (CLARITIN) 10 MG tablet   E-Prescribing Status: Receipt confirmed by pharmacy (08/15/2017 9:22 AM EST)

## 2017-09-08 ENCOUNTER — Telehealth: Payer: Self-pay | Admitting: Family Medicine

## 2017-09-08 MED ORDER — FLUCONAZOLE 150 MG PO TABS: 150.0000 mg | ORAL_TABLET | Freq: Once | ORAL | 0 refills | Status: DC

## 2017-09-08 NOTE — Addendum Note (Signed)
Addended by: Sharon Seller B on: 09/08/2017 10:28 AM   Modules accepted: Orders

## 2017-09-08 NOTE — Telephone Encounter (Signed)
Sent in diflucan and patient notified (left detailed message )

## 2017-09-08 NOTE — Telephone Encounter (Signed)
OK. Diflucan 150 mg x1. TY.

## 2017-09-08 NOTE — Telephone Encounter (Signed)
Copied from [place] [id number]. Topic: Quick Communication - Rx Refill/Question >> Sep 08, 2017  8:09 AM Scherrie Gerlach wrote: Medication: fluconazole (DIFLUCAN) 150 MG tablet  Pt states after having to take prednisone for an asthma attack ( went to the ED) she now has a yeast infection. Pt hopes Dr Nani Ravens will call this med in for her.  [id number] [address], [place]. [phone] (Phone) [phone] (Fax)

## 2017-10-17 ENCOUNTER — Other Ambulatory Visit: Payer: Self-pay | Admitting: Family Medicine

## 2017-10-17 DIAGNOSIS — Z1231 Encounter for screening mammogram for malignant neoplasm of breast: Secondary | ICD-10-CM

## 2017-11-04 ENCOUNTER — Ambulatory Visit
Admission: RE | Admit: 2017-11-04 | Discharge: 2017-11-04 | Disposition: A | Discharge status: Home / Self Care | Payer: BLUE CROSS/BLUE SHIELD | Source: Ambulatory Visit | Attending: Family Medicine | Admitting: Family Medicine

## 2017-11-04 DIAGNOSIS — Z1231 Encounter for screening mammogram for malignant neoplasm of breast: Secondary | ICD-10-CM

## 2017-11-23 ENCOUNTER — Encounter (HOSPITAL_BASED_OUTPATIENT_CLINIC_OR_DEPARTMENT_OTHER): Payer: Self-pay | Admitting: Emergency Medicine

## 2017-11-23 ENCOUNTER — Other Ambulatory Visit: Payer: Self-pay

## 2017-11-23 ENCOUNTER — Emergency Department (HOSPITAL_BASED_OUTPATIENT_CLINIC_OR_DEPARTMENT_OTHER)
Admission: EM | Admit: 2017-11-23 | Discharge: 2017-11-23 | Disposition: A | Discharge status: Home / Self Care | Payer: BLUE CROSS/BLUE SHIELD | Attending: Emergency Medicine | Admitting: Emergency Medicine

## 2017-11-23 DIAGNOSIS — R0981 Nasal congestion: Secondary | ICD-10-CM | POA: Insufficient documentation

## 2017-11-23 DIAGNOSIS — J069 Acute upper respiratory infection, unspecified: Secondary | ICD-10-CM | POA: Diagnosis not present

## 2017-11-23 DIAGNOSIS — R05 Cough: Secondary | ICD-10-CM | POA: Diagnosis not present

## 2017-11-23 DIAGNOSIS — Z79899 Other long term (current) drug therapy: Secondary | ICD-10-CM | POA: Insufficient documentation

## 2017-11-23 DIAGNOSIS — J040 Acute laryngitis: Secondary | ICD-10-CM | POA: Insufficient documentation

## 2017-11-23 DIAGNOSIS — J45909 Unspecified asthma, uncomplicated: Secondary | ICD-10-CM | POA: Insufficient documentation

## 2017-11-23 DIAGNOSIS — R070 Pain in throat: Secondary | ICD-10-CM | POA: Diagnosis present

## 2017-11-23 MED ORDER — PREDNISONE 20 MG PO TABS: 40.0000 mg | ORAL_TABLET | Freq: Every day | ORAL | 0 refills | Status: DC

## 2017-11-23 MED ORDER — PREDNISONE 50 MG PO TABS
60.0000 mg | ORAL_TABLET | Freq: Once | ORAL | Status: AC
  Administered 2017-11-23: 60 mg via ORAL
  Filled 2017-11-23: qty 1

## 2017-11-23 NOTE — Discharge Instructions (Signed)
Please read instructions below.  You can take tylenol as needed for sore throat. Continue taking allergy medication. Starting tomorrow, take 40mg  of prednisone daily until gone for your asthma. Use your albuterol inhaler as needed or wheezing. Drink plenty of water. Rest your voice, use a humidifier, drink warm tea to aid your voice.  Follow up with your primary care provider as needed.  Return to the ER for difficulty swallowing liquids, difficulty breathing, or new or worsening symptoms.

## 2017-11-23 NOTE — ED Provider Notes (Signed)
[facility] EMERGENCY DEPARTMENT Provider Note   CSN: [id number] Arrival date & time: 11/23/17  0911     History   Chief Complaint Chief Complaint  Patient presents with  . Sore Throat    HPI Kimberly Ali is a 42 y.o. female w PMHx asthma, seasonal allergies, presenting to the ED with sore throat and nasal congestion that began a few days ago.  Patient states she has been taking allergy medications for her symptoms.  She states she also feels as though her asthma has been acting up, require her to use her albuterol inhaler and nebulizer treatments frequently.  She states this morning she woke up and noticed that her voice was hoarse and that she was losing it.  She states she is also had intermittent dry cough.  Reports her chest feels a little bit tight right now, however she does not feel short of breath.  Denies associated fever, ear pain, or other associated symptoms.  The history is provided by the patient.    Past Medical History:  Diagnosis Date  . Anal skin tag   . Asthma   . Headache   . History of hay fever   . History of migraine   . Moderate persistent asthma 12/20/2016    Patient Active Problem List   Diagnosis Date Noted  . Moderate persistent asthma 12/20/2016    Past Surgical History:  Procedure Laterality Date  . ABDOMINAL HYSTERECTOMY     Fibroids  . CERVIX SURGERY     x2  . DILATION AND CURETTAGE OF UTERUS    . EXCISION OF SKIN TAG N/A 07/23/2017   Procedure: EXCISION OF SKIN TAG;  Surgeon: Clovis Riley, MD;  Location: [facility];  Service: General;  Laterality: N/A;     OB History   None      Home Medications    Prior to Admission medications   Medication Sig Start Date End Date Taking? Authorizing Provider  albuterol (PROVENTIL HFA;VENTOLIN HFA) 108 (90 Base) MCG/ACT inhaler Inhale 2 puffs into the lungs every 6 (six) hours as needed for wheezing or shortness of breath. 08/15/17   Shelda Pal,  DO  hydrocortisone (ANUSOL-HC) 2.5 % rectal cream Place 1 application rectally 2 (two) times daily. 06/12/17   Shelda Pal, DO  loratadine (CLARITIN) 10 MG tablet Take 1 tablet (10 mg total) by mouth daily. 08/15/17   Shelda Pal, DO  montelukast (SINGULAIR) 10 MG tablet Take 1 tablet (10 mg total) by mouth at bedtime. 07/13/16   Blanchie Dessert, MD  oxyCODONE-acetaminophen (PERCOCET/ROXICET) 5-325 MG tablet Take 1 tablet by mouth every 6 (six) hours as needed for severe pain. 07/23/17   Clovis Riley, MD  predniSONE (DELTASONE) 20 MG tablet Take 2 tablets (40 mg total) by mouth daily. 11/23/17   Tupac Jeffus, [place] N, PA-C    Family History Family History  Problem Relation Age of Onset  . Cancer Father        prostate  . Hypertension Father   . Diabetes Father   . Alcohol abuse Maternal Grandmother   . Alcohol abuse Maternal Grandfather     Social History Social History   Tobacco Use  . Smoking status: Never Smoker  . Smokeless tobacco: Never Used  Substance Use Topics  . Alcohol use: Yes    Comment: social  . Drug use: No     Allergies   Patient has no known allergies.   Review of Systems Review of Systems  Constitutional: Negative for chills and fever.  HENT: Positive for congestion and sore throat. Negative for ear pain, trouble swallowing and voice change.   Respiratory: Positive for cough and chest tightness. Negative for shortness of breath.   All other systems reviewed and are negative.    Physical Exam Updated Vital Signs BP 128/90 (BP Location: Left Arm)   Pulse 89   Temp 99.3 F (37.4 C) (Oral)   Resp (!) 22   Ht 5\' 5"  (1.651 m)   Wt 71.2 kg (157 lb)   SpO2 96%   BMI 26.13 kg/m   Physical Exam  Constitutional: She appears well-developed and well-nourished. She does not appear ill. No distress.  well-appearing, tolerating secretions. Voice is hoarse/strained  HENT:  Head: Normocephalic and atraumatic.  Right Ear:  Tympanic membrane and ear canal normal.  Left Ear: Tympanic membrane and ear canal normal.  Mouth/Throat: Uvula is midline, oropharynx is clear and moist and mucous membranes are normal. No uvula swelling. No oropharyngeal exudate or posterior oropharyngeal edema.  Eyes: Conjunctivae are normal.  Neck: Normal range of motion. Neck supple.  Cardiovascular: Normal rate, regular rhythm, normal heart sounds and intact distal pulses.  Pulmonary/Chest: Effort normal. No respiratory distress. She has wheezes (very faint wheezes b/l bases. ). She has no rhonchi. She has no rales.  No inc work of breathing.  Lymphadenopathy:    She has no cervical adenopathy.  Psychiatric: She has a normal mood and affect. Her behavior is normal.  Nursing note and vitals reviewed.    ED Treatments / Results  Labs (all labs ordered are listed, but only abnormal results are displayed) Labs Reviewed - No data to display  EKG None  Radiology No results found.  Procedures Procedures (including critical care time)  Medications Ordered in ED Medications  predniSONE (DELTASONE) tablet 60 mg (has no administration in time range)     Initial Impression / Assessment and Plan / ED Course  I have reviewed the triage vital signs and the nursing notes.  Pertinent labs & imaging results that were available during my care of the patient were reviewed by me and considered in my medical decision making (see chart for details).     Patients symptoms are consistent with URI with laryngitis, likely viral etiology. Afebrile, tolerating secretions.  Lungs with mild wheezes, though no inc work of breathing. Pt states she is breathing at her baseline without SOB. O2 sat 96% on RA. VSS. Dose of prednisone given in ED. Discussed that antibiotics are not indicated for viral infections. Pt will be discharged with symptomatic treatment and prednisone burst for asthma.  Verbalizes understanding and is agreeable with plan. Pt is  hemodynamically stable & in NAD prior to dc.  Discussed results, findings, treatment and follow up. Patient advised of return precautions. Patient verbalized understanding and agreed with plan.  Final Clinical Impressions(s) / ED Diagnoses   Final diagnoses:  Viral URI  Laryngitis    ED Discharge Orders        Ordered    predniSONE (DELTASONE) 20 MG tablet  Daily     11/23/17 1101       Tyreek [place], [place] N, [place] 11/23/17 1104    Tegeler, Gwenyth Allegra, MD 11/23/17 [phone]

## 2017-11-23 NOTE — ED Triage Notes (Signed)
Patient states that she has had a sore and scratchy throat x 2 days, then 30 PTA she started to loose her voice

## 2017-12-21 ENCOUNTER — Other Ambulatory Visit: Payer: Self-pay | Admitting: Family Medicine

## 2018-03-19 ENCOUNTER — Other Ambulatory Visit: Payer: Self-pay | Admitting: Family Medicine

## 2018-05-18 ENCOUNTER — Encounter: Payer: BLUE CROSS/BLUE SHIELD | Admitting: Family Medicine

## 2018-05-18 DIAGNOSIS — Z0289 Encounter for other administrative examinations: Secondary | ICD-10-CM

## 2018-05-21 ENCOUNTER — Ambulatory Visit: Payer: BLUE CROSS/BLUE SHIELD | Admitting: Family Medicine

## 2018-05-29 ENCOUNTER — Ambulatory Visit: Payer: BLUE CROSS/BLUE SHIELD | Admitting: Family Medicine

## 2018-05-29 DIAGNOSIS — Z0289 Encounter for other administrative examinations: Secondary | ICD-10-CM

## 2018-06-01 ENCOUNTER — Ambulatory Visit: Payer: BLUE CROSS/BLUE SHIELD | Admitting: Family Medicine

## 2018-06-01 ENCOUNTER — Encounter: Payer: Self-pay | Admitting: Family Medicine

## 2018-06-01 VITALS — BP 138/88 | HR 88 | Temp 98.4°F | Ht 65.0 in | Wt 167.4 lb

## 2018-06-01 DIAGNOSIS — J4540 Moderate persistent asthma, uncomplicated: Secondary | ICD-10-CM | POA: Diagnosis not present

## 2018-06-01 MED ORDER — MONTELUKAST SODIUM 10 MG PO TABS: 10.0000 mg | ORAL_TABLET | Freq: Every day | ORAL | 5 refills | Status: DC

## 2018-06-01 MED ORDER — FLUTICASONE PROPIONATE HFA 110 MCG/ACT IN AERO: 2.0000 | INHALATION_SPRAY | Freq: Two times a day (BID) | RESPIRATORY_TRACT | 2 refills | Status: DC

## 2018-06-01 MED ORDER — ALBUTEROL SULFATE HFA 108 (90 BASE) MCG/ACT IN AERS: INHALATION_SPRAY | RESPIRATORY_TRACT | 0 refills | Status: DC

## 2018-06-01 MED FILL — FLOVENT HFA 110 MCG INHALER: 110 | 30 days supply | Qty: 12 | Fill #0

## 2018-06-01 NOTE — Progress Notes (Signed)
Pre visit review using our clinic review tool, if applicable. No additional management support is needed unless otherwise documented below in the visit note. 

## 2018-06-01 NOTE — Patient Instructions (Signed)
Rinse your mouth out after using this inhaler. Use it when you are in the fall months/ragweed season.  Let us know if you need anything.

## 2018-06-01 NOTE — Progress Notes (Signed)
Chief Complaint  Patient presents with  . Asthma  . Shortness of Breath    Subjective: Patient is a 42 y.o. female here for asthma.  Has been using alb inhaler more often. Will have 2-3 nighttime awakenings per week and several during the day. Compliant with Allegra and Singulair. This happens around this time yearly. Has been wheezing and having SOB.   ROS: Heart: Denies chest pain  Lungs: +SOB   Past Medical History:  Diagnosis Date  . Anal skin tag   . Asthma   . Headache   . History of hay fever   . History of migraine   . Moderate persistent asthma 12/20/2016    Objective: BP 138/88 (BP Location: Left Arm, Patient Position: Sitting, Cuff Size: Normal)   Pulse 88   Temp 98.4 F (36.9 C) (Oral)   Ht 5\' 5"  (1.651 m)   Wt 167 lb 6 oz (75.9 kg)   SpO2 98%   BMI 27.85 kg/m  General: Awake, appears stated age HEENT: MMM, EOMi, nares patent Heart: RRR, no murmurs Lungs: CTAB, no rales, wheezes or rhonchi. No accessory muscle use Psych: Age appropriate judgment and insight, normal affect and mood  Assessment and Plan: Moderate persistent asthma without complication - Plan: montelukast (SINGULAIR) 10 MG tablet, albuterol (VENTOLIN HFA) 108 (90 Base) MCG/ACT inhaler, fluticasone (FLOVENT HFA) 110 MCG/ACT inhaler  Orders as above. Start ICS during ragweed season. Cont allergy regimen. Rinse mouth out after ICS use. F/u in 1 mo. The patient voiced understanding and agreement to the plan.  [place], DO 06/01/18  3:45 PM

## 2018-06-04 ENCOUNTER — Encounter: Payer: BLUE CROSS/BLUE SHIELD | Admitting: Family Medicine

## 2018-06-22 ENCOUNTER — Ambulatory Visit (INDEPENDENT_AMBULATORY_CARE_PROVIDER_SITE_OTHER): Payer: BLUE CROSS/BLUE SHIELD | Admitting: Family Medicine

## 2018-06-22 ENCOUNTER — Encounter: Payer: Self-pay | Admitting: Family Medicine

## 2018-06-22 VITALS — BP 124/81 | HR 87 | Temp 99.1°F | Resp 16 | Ht 65.0 in | Wt 167.0 lb

## 2018-06-22 DIAGNOSIS — D489 Neoplasm of uncertain behavior, unspecified: Secondary | ICD-10-CM | POA: Diagnosis not present

## 2018-06-22 DIAGNOSIS — Z2821 Immunization not carried out because of patient refusal: Secondary | ICD-10-CM | POA: Diagnosis not present

## 2018-06-22 DIAGNOSIS — Z0000 Encounter for general adult medical examination without abnormal findings: Secondary | ICD-10-CM | POA: Diagnosis not present

## 2018-06-22 NOTE — Patient Instructions (Addendum)
Give [place] 2-3 business days to get the results of your labs back.   Start lifting weights in addition to your cardio.   Keep the diet clean.   Let me know if you change your mind about the flu shot.   Let us know if you need anything.

## 2018-06-22 NOTE — Progress Notes (Signed)
CC: well visit   Well Woman Kimberly Ali is here for a complete physical.   Her last physical was >1 year ago.  Current diet: in general, a "healthy" diet. Current exercise: treadmill, cycling Weight is stable and she denies daytime fatigue. No LMP recorded. Patient has had a hysterectomy. Seatbelt? Yes  Breathing much better since starting ICS.   Health Maintenance Pap/HPV- No cervix Mammogram- Yes Tetanus- Yes HIV screening- No  Past Medical History:  Diagnosis Date  . Anal skin tag   . Asthma   . Headache   . History of hay fever   . History of migraine   . Moderate persistent asthma 12/20/2016     Past Surgical History:  Procedure Laterality Date  . ABDOMINAL HYSTERECTOMY     Fibroids  . CERVIX SURGERY     x2  . DILATION AND CURETTAGE OF UTERUS    . EXCISION OF SKIN TAG N/A 07/23/2017   Procedure: EXCISION OF SKIN TAG;  Surgeon: Clovis Riley, MD;  Location: [facility];  Service: General;  Laterality: N/A;   Medications  Current Outpatient Medications on File Prior to Visit  Medication Sig Dispense Refill  . albuterol (VENTOLIN HFA) 108 (90 Base) MCG/ACT inhaler TAKE 2 PUFFS BY MOUTH EVERY 6 HOURS AS NEEDED FOR WHEEZE OR SHORTNESS OF BREATH 6.7 Inhaler 0  . ALLERGY 10 MG tablet TAKE 1 TABLET BY MOUTH EVERY DAY 30 tablet 1  . fluticasone (FLOVENT HFA) 110 MCG/ACT inhaler Inhale 2 puffs into the lungs 2 (two) times daily. 1 Inhaler 2  . montelukast (SINGULAIR) 10 MG tablet Take 1 tablet (10 mg total) by mouth at bedtime. 30 tablet 5   Allergies No Known Allergies   Review of Systems: Constitutional:  no unexpected weight changes Eye:  no recent significant change in vision Ear/Nose/Mouth/Throat:  Ears:  no tinnitus or vertigo and no recent change in hearing Nose/Mouth/Throat:  no complaints of nasal congestion, no sore throat Cardiovascular: no chest pain Respiratory:  no cough and no shortness of breath Gastrointestinal:  no abdominal  pain, no change in bowel habits GU:  Female: negative for dysuria or pelvic pain Musculoskeletal/Extremities:  no pain of the joints Integumentary (Skin/Breast): Growing lesion on R leg; otherwise no abnormal skin lesions reported Neurologic:  no headaches Endocrine:  denies fatigue Hematologic/Lymphatic:  No areas of easy bleeding  Exam BP 124/81 (BP Location: Right Arm, Patient Position: Sitting, Cuff Size: Small)   Pulse 87   Temp 99.1 F (37.3 C) (Oral)   Resp 16   Ht 5\' 5"  (1.651 m)   Wt 167 lb (75.8 kg)   SpO2 100%   BMI 27.79 kg/m  General:  well developed, well nourished, in no apparent distress Skin: See below; otherwise no significant moles, warts, or growths Head:  no masses, lesions, or tenderness Eyes:  pupils equal and round, sclera anicteric without injection Ears:  canals without lesions, TMs shiny without retraction, no obvious effusion, no erythema Nose:  nares patent, septum midline, mucosa normal, and no drainage or sinus tenderness Throat/Pharynx:  lips and gingiva without lesion; tongue and uvula midline; non-inflamed pharynx; no exudates or postnasal drainage Neck: neck supple without adenopathy, thyromegaly, or masses Lungs:  clear to auscultation, breath sounds equal bilaterally, no respiratory distress Cardio:  regular rate and rhythm, no bruits, no LE edema Abdomen:  abdomen soft, nontender; bowel sounds normal; no masses or organomegaly Genital: Defer to GYN Musculoskeletal:  symmetrical muscle groups noted without atrophy or deformity  Extremities:  no clubbing, cyanosis, or edema, no deformities, no skin discoloration Neuro:  gait normal; deep tendon reflexes normal and symmetric Psych: well oriented with normal range of affect and appropriate judgment/insight   RLE  Procedure note; punch biopsy  Informed consent was obtained. The area was cleaned with alcohol and then injected with 1 mL of 1% lidocaine with epinephrine. The area was then cleaned  with Hibiclens. Vertical traction was placed along the skin and a 2 mm punch biopsy was used. The specimen was grasped with forceps and separated with iris scissors. The specimen was placed in a sterile specimen cup and sent to the lab. Unfortunately, the initial lesion was trapped in the biopsy device and this had to be repeated over a different portion of the lesion.  TAO and a bandaid were placed.  There were no complications noted otherwise. The patient tolerated the procedure well.   Assessment and Plan  Well adult exam - Plan: CBC, Comprehensive metabolic panel, Lipid panel  Refused influenza vaccine  Neoplasm of uncertain behavior - Plan: Dermatology pathology([facility])   Well 42 y.o. female. Counseled on diet and exercise. Other orders as above. Aftercare instructions verbalized. 1 week for biopsy to come back.  Follow up in 6 mo. The patient voiced understanding and agreement to the plan.  [place], DO 06/22/18 3:26 PM

## 2018-06-23 LAB — COMPREHENSIVE METABOLIC PANEL
ALT: 13 U/L (ref 0–35)
AST: 13 U/L (ref 0–37)
Albumin: 4.6 g/dL (ref 3.5–5.2)
Alkaline Phosphatase: 51 U/L (ref 39–117)
BUN: 14 mg/dL (ref 6–23)
CO2: 30 mEq/L (ref 19–32)
Calcium: 10 mg/dL (ref 8.4–10.5)
Chloride: 103 mEq/L (ref 96–112)
Creatinine, Ser: 0.86 mg/dL (ref 0.40–1.20)
GFR: 92.72 mL/min (ref >60.00)
Glucose, Bld: 79 mg/dL (ref 70–99)
Potassium: 4.4 mEq/L (ref 3.5–5.1)
Sodium: 138 mEq/L (ref 135–145)
Total Bilirubin: 0.3 mg/dL (ref 0.2–1.2)
Total Protein: 7 g/dL (ref 6.0–8.3)

## 2018-06-23 LAB — LIPID PANEL
Cholesterol: 143 mg/dL (ref 0–200)
HDL: 53 mg/dL (ref >39.00)
LDL Cholesterol: 76 mg/dL (ref 0–99)
NonHDL: 89.55
Total CHOL/HDL Ratio: 3
Triglycerides: 70 mg/dL (ref 0.0–149.0)
VLDL: 14 mg/dL (ref 0.0–40.0)

## 2018-06-23 LAB — CBC
HCT: 36.3 % (ref 36.0–46.0)
Hemoglobin: 11.7 g/dL — ABNORMAL LOW (ref 12.0–15.0)
MCHC: 32.1 g/dL (ref 30.0–36.0)
MCV: 83.6 fl (ref 78.0–100.0)
Platelets: 314 10*3/uL (ref 150.0–400.0)
RBC: 4.35 Mil/uL (ref 3.87–5.11)
RDW: 14 % (ref 11.5–15.5)
WBC: 7.1 10*3/uL (ref 4.0–10.5)

## 2018-06-24 ENCOUNTER — Other Ambulatory Visit (INDEPENDENT_AMBULATORY_CARE_PROVIDER_SITE_OTHER): Payer: BLUE CROSS/BLUE SHIELD

## 2018-06-24 DIAGNOSIS — D649 Anemia, unspecified: Secondary | ICD-10-CM | POA: Diagnosis not present

## 2018-06-24 LAB — IBC PANEL
Iron: 73 ug/dL (ref 42–145)
Saturation Ratios: 22.1 % (ref 20.0–50.0)
Transferrin: 236 mg/dL (ref 212.0–360.0)

## 2018-06-24 LAB — FERRITIN: Ferritin: 42.6 ng/mL (ref 10.0–291.0)

## 2018-06-26 ENCOUNTER — Telehealth: Payer: Self-pay | Admitting: *Deleted

## 2018-06-26 NOTE — Telephone Encounter (Signed)
Received Dermatopathology Report results from [facility]; forwarded to provider/SLS 11/01

## 2018-07-22 ENCOUNTER — Ambulatory Visit: Payer: BLUE CROSS/BLUE SHIELD | Admitting: Family Medicine

## 2018-07-22 ENCOUNTER — Encounter: Payer: Self-pay | Admitting: Family Medicine

## 2018-07-22 VITALS — BP 118/82 | HR 95 | Temp 98.7°F | Ht 65.0 in | Wt 160.5 lb

## 2018-07-22 DIAGNOSIS — R221 Localized swelling, mass and lump, neck: Secondary | ICD-10-CM | POA: Diagnosis not present

## 2018-07-22 NOTE — Progress Notes (Signed)
Chief Complaint  Patient presents with  . Cyst    left side of neck    Kimberly Ali is a 42 y.o. female here for a skin complaint.  Duration: 2 weeks Location: L side of neck Pruritic? No Painful? Yes Drainage? No Other associated symptoms: One grew, one got smaller Therapies tried thus far: hot compress, gentle massage Has lost several lbs unintentionally, denies other B symptoms such as night sweats, cough or fevers.    ROS:  Const: No fevers, +wt loss Skin: As noted in HPI  Past Medical History:  Diagnosis Date  . Anal skin tag   . Asthma   . Headache   . History of hay fever   . History of migraine   . Moderate persistent asthma 12/20/2016    BP 118/82 (BP Location: Left Arm, Patient Position: Sitting, Cuff Size: Normal)   Pulse 95   Temp 98.7 F (37.1 C) (Oral)   Ht 5\' 5"  (1.651 m)   Wt 160 lb 8 oz (72.8 kg)   SpO2 96%   BMI 26.71 kg/m  Gen: awake, alert, appearing stated age Lungs: No accessory muscle use Skin: 1.3 cm and 0.9 cm masses on L side of neck. Freely moveable. +ttp. No drainage, erythema, fluctuance, excoriation Psych: Age appropriate judgment and insight  Neck mass - Plan: TSH, CBC, US Soft Tissue Head/Neck  Orders as above. Daughter dx'd w Berenice Primas had similar early presentation, will ck TSH. Ck [place]. May need CT f/u, hopefully not lymphoma.  F/u prn. The patient voiced understanding and agreement to the plan.  [place], DO 07/22/18 2:27 PM

## 2018-07-22 NOTE — Patient Instructions (Addendum)
Ice/cold pack over area for 10-15 min twice daily.  If you do not hear anything about your ultrasound in the next several business days, call our office and ask for an update.   Ibuprofen 400-600 mg (2-3 over the counter strength tabs) every 6 hours as needed for pain.  OK to take Tylenol 1000 mg (2 extra strength tabs) or 975 mg (3 regular strength tabs) every 6 hours as needed.  Let us know if you need anything.

## 2018-07-22 NOTE — Progress Notes (Signed)
Pre visit review using our clinic review tool, if applicable. No additional management support is needed unless otherwise documented below in the visit note. 

## 2018-07-23 LAB — CBC
HCT: 35.8 % (ref 35.0–45.0)
Hemoglobin: 11.9 g/dL (ref 11.7–15.5)
MCH: 26.6 pg — ABNORMAL LOW (ref 27.0–33.0)
MCHC: 33.2 g/dL (ref 32.0–36.0)
MCV: 80.1 fL (ref 80.0–100.0)
MPV: 10.3 fL (ref 7.5–12.5)
Platelets: 264 10*3/uL (ref 140–400)
RBC: 4.47 10*6/uL (ref 3.80–5.10)
RDW: 12.5 % (ref 11.0–15.0)
WBC: 3.2 10*3/uL — ABNORMAL LOW (ref 3.8–10.8)

## 2018-07-23 LAB — TSH: TSH: 0.87 mIU/L

## 2018-07-25 ENCOUNTER — Other Ambulatory Visit: Payer: Self-pay | Admitting: Family Medicine

## 2018-07-25 DIAGNOSIS — D72819 Decreased white blood cell count, unspecified: Secondary | ICD-10-CM

## 2018-07-27 ENCOUNTER — Telehealth: Payer: Self-pay | Admitting: Family Medicine

## 2018-07-27 NOTE — Telephone Encounter (Signed)
Attempted to call pt but mailbox is full and unable to leave a voicemail message.

## 2018-07-27 NOTE — Telephone Encounter (Signed)
Copied from [place] [id number]. Topic: Quick Communication - Lab Results (Clinic Use ONLY) >> Jul 27, 2018 10:06 AM Ewing, Donell Sievert, CMA wrote: Called patient to inform them of 07/27/2018 lab results. When patient returns call, triage nurse may disclose results.

## 2018-08-03 ENCOUNTER — Other Ambulatory Visit: Payer: BLUE CROSS/BLUE SHIELD

## 2018-08-10 ENCOUNTER — Other Ambulatory Visit (INDEPENDENT_AMBULATORY_CARE_PROVIDER_SITE_OTHER): Payer: BLUE CROSS/BLUE SHIELD

## 2018-08-10 DIAGNOSIS — D72819 Decreased white blood cell count, unspecified: Secondary | ICD-10-CM | POA: Diagnosis not present

## 2018-08-11 ENCOUNTER — Ambulatory Visit (HOSPITAL_BASED_OUTPATIENT_CLINIC_OR_DEPARTMENT_OTHER)
Admission: RE | Admit: 2018-08-11 | Discharge: 2018-08-11 | Disposition: A | Discharge status: Home / Self Care | Payer: BLUE CROSS/BLUE SHIELD | Source: Ambulatory Visit | Attending: Family Medicine | Admitting: Family Medicine

## 2018-08-11 DIAGNOSIS — R221 Localized swelling, mass and lump, neck: Secondary | ICD-10-CM | POA: Insufficient documentation

## 2018-08-11 DIAGNOSIS — E042 Nontoxic multinodular goiter: Secondary | ICD-10-CM | POA: Diagnosis not present

## 2018-08-11 LAB — CBC (INCLUDES DIFF/PLT) WITH PATHOLOGIST REVIEW
Absolute Monocytes: 729 cells/uL (ref 200–950)
Basophils Absolute: 10 cells/uL (ref 0–200)
Basophils Relative: 0.2 %
Eosinophils Absolute: 148 cells/uL (ref 15–500)
Eosinophils Relative: 2.9 %
HCT: 35.8 % (ref 35.0–45.0)
Hemoglobin: 11.6 g/dL — ABNORMAL LOW (ref 11.7–15.5)
Lymphs Abs: 1816 cells/uL (ref 850–3900)
MCH: 26.1 pg — ABNORMAL LOW (ref 27.0–33.0)
MCHC: 32.4 g/dL (ref 32.0–36.0)
MCV: 80.6 fL (ref 80.0–100.0)
MPV: 10.8 fL (ref 7.5–12.5)
Monocytes Relative: 14.3 %
Neutro Abs: 2397 cells/uL (ref 1500–7800)
Neutrophils Relative %: 47 %
Platelets: 311 10*3/uL (ref 140–400)
RBC: 4.44 10*6/uL (ref 3.80–5.10)
RDW: 12.8 % (ref 11.0–15.0)
Total Lymphocyte: 35.6 %
WBC: 5.1 10*3/uL (ref 3.8–10.8)

## 2018-09-29 ENCOUNTER — Other Ambulatory Visit: Payer: Self-pay | Admitting: Family Medicine

## 2018-09-29 DIAGNOSIS — Z1231 Encounter for screening mammogram for malignant neoplasm of breast: Secondary | ICD-10-CM

## 2018-11-18 ENCOUNTER — Ambulatory Visit: Payer: BLUE CROSS/BLUE SHIELD

## 2018-12-10 ENCOUNTER — Other Ambulatory Visit: Payer: Self-pay | Admitting: Family Medicine

## 2018-12-11 ENCOUNTER — Encounter: Payer: Self-pay | Admitting: Family Medicine

## 2018-12-14 ENCOUNTER — Other Ambulatory Visit: Payer: Self-pay

## 2018-12-14 ENCOUNTER — Encounter: Payer: Self-pay | Admitting: Family Medicine

## 2018-12-14 ENCOUNTER — Ambulatory Visit (INDEPENDENT_AMBULATORY_CARE_PROVIDER_SITE_OTHER): Payer: BLUE CROSS/BLUE SHIELD | Admitting: Family Medicine

## 2018-12-14 DIAGNOSIS — J4540 Moderate persistent asthma, uncomplicated: Secondary | ICD-10-CM

## 2018-12-14 MED ORDER — LEVOCETIRIZINE DIHYDROCHLORIDE 5 MG PO TABS: 5.0000 mg | ORAL_TABLET | Freq: Every evening | ORAL | 2 refills | Status: DC

## 2018-12-14 MED ORDER — FLUTICASONE PROPIONATE HFA 110 MCG/ACT IN AERO: 2.0000 | INHALATION_SPRAY | Freq: Two times a day (BID) | RESPIRATORY_TRACT | 5 refills | Status: DC

## 2018-12-14 MED ORDER — ALBUTEROL SULFATE HFA 108 (90 BASE) MCG/ACT IN AERS: INHALATION_SPRAY | RESPIRATORY_TRACT | 0 refills | Status: DC

## 2018-12-14 MED ORDER — ALBUTEROL SULFATE (2.5 MG/3ML) 0.083% IN NEBU: 2.5000 mg | INHALATION_SOLUTION | Freq: Four times a day (QID) | RESPIRATORY_TRACT | 12 refills | Status: DC | PRN

## 2018-12-14 MED ORDER — MONTELUKAST SODIUM 10 MG PO TABS: 10.0000 mg | ORAL_TABLET | Freq: Every day | ORAL | 5 refills | Status: DC

## 2018-12-14 NOTE — Progress Notes (Signed)
Chief Complaint  Patient presents with  . Allergies    Subjective: Patient is a 43 y.o. female here for allergies. Due to outbreak, we are interacting via web portal for an electronic face-to-face visit. I verified patient's ID using 2 identifiers.   Pt has as hx of asthma and allergies. 4 d ago, started having allergy s/s's, started INCS. Those have resolved. Having a bit of an asthma flare, using albuterol more freq. Having shaking, concerned something else may be going on. Does not currently have a thermometer. No sick contacts. Has not been using ICS, wanted to be sure it was safe to restart.   ROS: Const: No fevers Lungs: Denies wheezing  Past Medical History:  Diagnosis Date  . Anal skin tag   . Asthma   . Headache   . History of hay fever   . History of migraine   . Moderate persistent asthma 12/20/2016    Objective: No conversational dyspnea Age appropriate judgment and insight Nml affect and mood  Assessment and Plan: Moderate persistent asthma without complication - Plan: montelukast (SINGULAIR) 10 MG tablet, albuterol (VENTOLIN HFA) 108 (90 Base) MCG/ACT inhaler, fluticasone (FLOVENT HFA) 110 MCG/ACT inhaler  Restart ICS while in yellow zone, asthma action plan w zones sent via message. . Const INCS. I will change her Claritin to Xyzal. Home options given.  F/u as originally scheduled.  The patient voiced understanding and agreement to the plan.  [place], DO 12/14/18  9:05 AM

## 2018-12-23 ENCOUNTER — Other Ambulatory Visit: Payer: Self-pay

## 2018-12-23 ENCOUNTER — Ambulatory Visit (INDEPENDENT_AMBULATORY_CARE_PROVIDER_SITE_OTHER): Payer: BLUE CROSS/BLUE SHIELD | Admitting: Family Medicine

## 2018-12-23 ENCOUNTER — Encounter: Payer: Self-pay | Admitting: Family Medicine

## 2018-12-23 DIAGNOSIS — J4540 Moderate persistent asthma, uncomplicated: Secondary | ICD-10-CM

## 2018-12-23 DIAGNOSIS — R002 Palpitations: Secondary | ICD-10-CM | POA: Diagnosis not present

## 2018-12-23 NOTE — Progress Notes (Signed)
Chief Complaint  Patient presents with  . Follow-up    Subjective: Patient is a 43 y.o. female here for asthma f/u. Due to COVID-19 pandemic, we are interacting via web portal for an electronic face-to-face visit. I verified patient's ID using 2 identifiers. Patient agreed to proceed with visit via this method. Patient is at home, I am at office. Patient and I are present for visit.   Asthma flare last week. Started on ICS and changed Claritin to Zyrtec. Doing much better with breathing. Has used SABA 1x since then and that was because she was playing with her dog. She did have an episode of palpitations and went to UC. Neg workup.   ROS: Heart: Denies chest pain or current palpitations Lungs: Denies SOB   Past Medical History:  Diagnosis Date  . Anal skin tag   . Asthma   . Headache   . History of hay fever   . History of migraine   . Moderate persistent asthma 12/20/2016    Objective: No conversational dyspnea Age appropriate judgment and insight Nml affect and mood  Assessment and Plan: Moderate persistent asthma without complication  Palpitations  Cont care plan for asthma. Play around with ICS requirements through the year. Likely AE from SABA vs anxiety. If concerning, we can refer to cards vs getting Holter set up. Will let us know if it happens again. F/u in 6 mo for CPE or prn.  The patient voiced understanding and agreement to the plan.  [place], DO 12/23/18  3:56 PM

## 2018-12-24 ENCOUNTER — Ambulatory Visit: Payer: BLUE CROSS/BLUE SHIELD | Admitting: Family Medicine

## 2018-12-30 ENCOUNTER — Other Ambulatory Visit: Payer: Self-pay

## 2018-12-30 ENCOUNTER — Ambulatory Visit (INDEPENDENT_AMBULATORY_CARE_PROVIDER_SITE_OTHER): Payer: BLUE CROSS/BLUE SHIELD | Admitting: Family Medicine

## 2018-12-30 ENCOUNTER — Telehealth: Payer: Self-pay

## 2018-12-30 ENCOUNTER — Encounter: Payer: Self-pay | Admitting: Family Medicine

## 2018-12-30 DIAGNOSIS — N63 Unspecified lump in unspecified breast: Secondary | ICD-10-CM

## 2018-12-30 NOTE — Telephone Encounter (Signed)
Please schedule patient appointment with pcp.

## 2018-12-30 NOTE — Progress Notes (Signed)
Chief Complaint  Patient presents with  . Breast Pain  . Back Pain    Subjective: Patient is a 43 y.o. female here for breast lump. Due to COVID-19 pandemic, we are interacting via web portal for an electronic face-to-face visit. I verified patient's ID using 2 identifiers. Patient agreed to proceed with visit via this method. Patient is at home, I am at office. Patient and I are present for visit.   2 d ago, pt noticed a lump in her R breast approx 1 in in diameter. It is hard and does not easily move. She does not have cycles due to her hysterectomy. She feels an area that is longer on the L side, which she noticed at the same time. It is not present on the R. No personal or famhx of breast cancer. Denies fevers, redness, skin changes/dimpling, bruising, nipple discharge, injury, unexplained wt loss. She had her mammogram scheduled, but it was cancelled 2/2 pandemic.   ROS: Skin: As noted in HPI  Past Medical History:  Diagnosis Date  . Anal skin tag   . Asthma   . Headache   . History of hay fever   . History of migraine   . Moderate persistent asthma 12/20/2016    Objective: No conversational dyspnea Age appropriate judgment and insight Nml affect and mood  Assessment and Plan: Breast lump - Plan: US BREAST COMPLETE UNI LEFT INC AXILLA, US BREAST COMPLETE UNI RIGHT INC AXILLA, MM Digital Diagnostic Bilat  Orders as above. Ck [place] and mammo. Worried more on R side. Unsure on L portion, but given I cannot examine here, will air on side of caution. F/u prn for this.  The patient voiced understanding and agreement to the plan.  [place], DO 12/30/18  11:07 AM

## 2019-01-12 ENCOUNTER — Other Ambulatory Visit: Payer: BLUE CROSS/BLUE SHIELD

## 2019-01-15 ENCOUNTER — Ambulatory Visit
Admission: RE | Admit: 2019-01-15 | Discharge: 2019-01-15 | Disposition: A | Discharge status: Home / Self Care | Payer: BLUE CROSS/BLUE SHIELD | Source: Ambulatory Visit | Attending: Family Medicine | Admitting: Family Medicine

## 2019-01-15 ENCOUNTER — Other Ambulatory Visit: Payer: Self-pay | Admitting: Family Medicine

## 2019-01-15 ENCOUNTER — Other Ambulatory Visit: Payer: Self-pay

## 2019-01-15 DIAGNOSIS — N63 Unspecified lump in unspecified breast: Secondary | ICD-10-CM

## 2019-01-15 DIAGNOSIS — N6310 Unspecified lump in the right breast, unspecified quadrant: Secondary | ICD-10-CM

## 2019-03-19 ENCOUNTER — Other Ambulatory Visit: Payer: Self-pay

## 2019-03-19 ENCOUNTER — Ambulatory Visit: Payer: Self-pay | Admitting: Family Medicine

## 2019-03-19 NOTE — Telephone Encounter (Signed)
Pt. Reports she started having vaginal itching last night. No discharge. Worse with urination.No fever. Warm transfer to [place] in the practice.  Answer Assessment - Initial Assessment Questions 1. SYMPTOM: "What's the main symptom you're concerned about?" (e.g., pain, itching, dryness)     Itching 2. LOCATION: "Where is the  itching located?" (e.g., inside/outside, left/right)     Inside 3. ONSET: "When did the  Last night start?"     Last night 4. PAIN: "Is there any pain?" If so, ask: "How bad is it?" (Scale: 1-10; mild, moderate, severe)     No 5. ITCHING: "Is there any itching?" If so, ask: "How bad is it?" (Scale: 1-10; mild, moderate, severe)     Moderate 6. CAUSE: "What do you think is causing the discharge?" "Have you had the same problem before? What happened then?"     Yes- with a yeast infection 7. OTHER SYMPTOMS: "Do you have any other symptoms?" (e.g., fever, itching, vaginal bleeding, pain with urination, injury to genital area, vaginal foreign body)     More so with urination 8. PREGNANCY: "Is there any chance you are pregnant?" "When was your last menstrual period?"     No  Protocols used: VAGINAL [facility]

## 2019-03-19 NOTE — Telephone Encounter (Signed)
Appt scheduled

## 2019-03-22 ENCOUNTER — Other Ambulatory Visit: Payer: Self-pay

## 2019-03-22 ENCOUNTER — Other Ambulatory Visit: Payer: Self-pay | Admitting: Family Medicine

## 2019-03-22 ENCOUNTER — Encounter: Payer: Self-pay | Admitting: Family Medicine

## 2019-03-22 ENCOUNTER — Ambulatory Visit (INDEPENDENT_AMBULATORY_CARE_PROVIDER_SITE_OTHER): Payer: BC Managed Care – PPO | Admitting: Family Medicine

## 2019-03-22 ENCOUNTER — Other Ambulatory Visit (HOSPITAL_COMMUNITY)
Admission: RE | Admit: 2019-03-22 | Discharge: 2019-03-22 | Disposition: A | Discharge status: Home / Self Care | Payer: BC Managed Care – PPO | Source: Ambulatory Visit | Attending: Family Medicine | Admitting: Family Medicine

## 2019-03-22 VITALS — BP 108/72 | HR 97 | Temp 99.5°F | Ht 65.0 in | Wt 158.0 lb

## 2019-03-22 DIAGNOSIS — B3731 Acute candidiasis of vulva and vagina: Secondary | ICD-10-CM

## 2019-03-22 DIAGNOSIS — B373 Candidiasis of vulva and vagina: Secondary | ICD-10-CM | POA: Insufficient documentation

## 2019-03-22 DIAGNOSIS — J4540 Moderate persistent asthma, uncomplicated: Secondary | ICD-10-CM

## 2019-03-22 MED ORDER — FLUCONAZOLE 150 MG PO TABS: ORAL_TABLET | ORAL | 0 refills | Status: DC

## 2019-03-22 NOTE — Progress Notes (Signed)
Chief Complaint  Patient presents with  . Follow-up    Kimberly Ali is a 43 y.o. female here for vaginal issue.  Duration: 4 days Itching, feels inside. Odor: No New sexual partner: No Urinary complaints: No Feels like yeast infection No new topicals. Denies fevers, bleeding, abdominal pain.  ROS:  GU: as noted in HPI  Past Medical History:  Diagnosis Date  . Anal skin tag   . Asthma   . Headache   . History of hay fever   . History of migraine   . Moderate persistent asthma 12/20/2016   Family History  Problem Relation Age of Onset  . Cancer Father        prostate  . Hypertension Father   . Diabetes Father   . Alcohol abuse Maternal Grandmother   . Alcohol abuse Maternal Grandfather     BP 108/72 (BP Location: Left Arm, Patient Position: Sitting, Cuff Size: Normal)   Pulse 97   Temp 99.5 F (37.5 C) (Oral)   Ht 5\' 5"  (1.651 m)   Wt 158 lb (71.7 kg)   SpO2 97%   BMI 26.29 kg/m  Gen: Awake, alert, appears stated age Heart: RRR Lungs: CTAB, no accessory muscle use Abd: BS+, soft, NT, ND, no masses or organomegaly GU: deferred today.  Psych: Age appropriate judgment and insight, nml mood and affect  Yeast vaginitis - Plan: fluconazole (DIFLUCAN) 150 MG tablet, Urine cytology ancillary only([facility])  F/u prn. Pt voiced understanding and agreement to the plan.  [place], DO 03/22/19 10:31 AM

## 2019-03-22 NOTE — Patient Instructions (Signed)
We will be in touch over the next few days regarding your urine results.   Let us know if you need anything.

## 2019-03-23 ENCOUNTER — Other Ambulatory Visit: Payer: Self-pay | Admitting: Family Medicine

## 2019-03-23 DIAGNOSIS — J4540 Moderate persistent asthma, uncomplicated: Secondary | ICD-10-CM

## 2019-03-23 LAB — URINE CYTOLOGY ANCILLARY ONLY: Trichomonas: NEGATIVE

## 2019-03-24 LAB — URINE CYTOLOGY ANCILLARY ONLY: Candida vaginitis: NEGATIVE

## 2019-03-25 ENCOUNTER — Other Ambulatory Visit: Payer: Self-pay | Admitting: Family Medicine

## 2019-03-25 MED ORDER — METRONIDAZOLE 500 MG PO TABS: 500.0000 mg | ORAL_TABLET | Freq: Two times a day (BID) | ORAL | 0 refills | Status: AC

## 2019-05-11 ENCOUNTER — Other Ambulatory Visit: Payer: Self-pay

## 2019-05-12 ENCOUNTER — Ambulatory Visit: Payer: BC Managed Care – PPO | Admitting: Family Medicine

## 2019-05-12 ENCOUNTER — Encounter: Payer: Self-pay | Admitting: Family Medicine

## 2019-05-12 DIAGNOSIS — J4540 Moderate persistent asthma, uncomplicated: Secondary | ICD-10-CM | POA: Diagnosis not present

## 2019-05-12 MED ORDER — ALBUTEROL SULFATE HFA 108 (90 BASE) MCG/ACT IN AERS: 1.0000 | INHALATION_SPRAY | Freq: Four times a day (QID) | RESPIRATORY_TRACT | 1 refills | Status: DC | PRN

## 2019-05-12 MED ORDER — FLUTICASONE PROPIONATE HFA 110 MCG/ACT IN AERO: 2.0000 | INHALATION_SPRAY | Freq: Two times a day (BID) | RESPIRATORY_TRACT | 5 refills | Status: DC

## 2019-05-12 NOTE — Patient Instructions (Signed)
Keep me up to date with the breathing issues. Send me a message in 1 week if no improvement.  Remember to rinse your mouth out after you use the Flovent.  Asthma Action Plan There are 3 zones to consider: 1. Green Zone- This is the zone we hope to keep you in. Avoiding allergens and compliance with inhalers will keep you in this safe zone. No need to take anything extra while in this zone. 2. Yellow Zone- This zone is where we can save time, money and visits. You are not doing well enough to be considered in the green zone, but not bad enough to be in the red zone. This is where we need to remove any allergen or factor that is contributing to your breathing issues. You have a separate inhaler (inhaled steroid) to take twice daily in addition to your other inhalers. This should give you the push you need to get back to the green zone. Contact our office if you have any questions. 3. Red Zone- This is the zone where you should consider calling 911 vs going to the ER. Despite compliance with inhalers/meds (or maybe because we haven't been using them), our breathing isn't where we want it to be. Albuterol isn't helping as much either and you need medical care. This is the zone we try to avoid as much as possible!  Let us know if you need anything.

## 2019-05-12 NOTE — Progress Notes (Signed)
Chief Complaint  Patient presents with  . Asthma    problems using inhaler too much    Subjective: Patient is a 43 y.o. female here for f/u asthma.  Has been using SABA daily and multiple times per day and now is waking up. This has been going on for the past 2 weeks. She is taking Singulair daily. Has not been using Flovent, is on Zyrtec daily. Having some itchy eyes/ears in addition to wheezing and sob. Daughter will sometimes notice apneic episodes at night. She does wake up feeling tired, but has been waking up for asthma as well.   ROS: Heart: Denies chest pain  Lungs: Denies current SOB   Past Medical History:  Diagnosis Date  . Anal skin tag   . Asthma   . Headache   . History of hay fever   . History of migraine   . Moderate persistent asthma 12/20/2016    Objective: BP 130/82 (BP Location: Left Arm, Patient Position: Sitting, Cuff Size: Normal)   Pulse (!) 102   Temp 97.9 F (36.6 C) (Temporal)   Ht 5\' 5"  (1.651 m)   Wt 161 lb 4 oz (73.1 kg)   SpO2 93%   BMI 26.83 kg/m  General: Awake, appears stated age HEENT: MMM, EOMi, ears neg b/l Heart: RRR, no LE edema Lungs: CTAB, no rales, wheezes or rhonchi. No accessory muscle use Psych: Age appropriate judgment and insight, normal affect and mood  Assessment and Plan: Moderate persistent asthma without complication - Plan: albuterol (VENTOLIN HFA) 108 (90 Base) MCG/ACT inhaler, fluticasone (FLOVENT HFA) 110 MCG/ACT inhaler  Add back ICS. SABA prn. If no improvement, will add LABA. Cont other meds. May need to add INCS.  Keep an eye on apneic episodes for now, offered to refer to sleep team but she would like to see how she does on more intensive asthma tx.  F/u in 1 mo.  The patient voiced understanding and agreement to the plan.  [place], DO 05/12/19  10:52 AM

## 2019-06-09 ENCOUNTER — Other Ambulatory Visit: Payer: Self-pay

## 2019-06-09 ENCOUNTER — Encounter: Payer: Self-pay | Admitting: Family Medicine

## 2019-06-09 ENCOUNTER — Ambulatory Visit: Payer: BC Managed Care – PPO | Admitting: Family Medicine

## 2019-06-09 VITALS — BP 108/64 | HR 84 | Temp 97.9°F | Ht 65.0 in | Wt 159.2 lb

## 2019-06-09 DIAGNOSIS — G43809 Other migraine, not intractable, without status migrainosus: Secondary | ICD-10-CM

## 2019-06-09 DIAGNOSIS — J4540 Moderate persistent asthma, uncomplicated: Secondary | ICD-10-CM | POA: Diagnosis not present

## 2019-06-09 MED ORDER — RIZATRIPTAN BENZOATE 10 MG PO TABS: 10.0000 mg | ORAL_TABLET | ORAL | 0 refills | Status: DC | PRN

## 2019-06-09 NOTE — Patient Instructions (Signed)
If you are using your albuterol 3+ times per week after the next 2-3 weeks, send me a message and we will send in a replacement inhaler. Remember to take your Flovent twice daily instead of only in the morning.  Try the Maxalt when you feel the headache coming on.   Let us know if you need anything.

## 2019-06-09 NOTE — Progress Notes (Signed)
Chief Complaint  Patient presents with  . Asthma    Subjective: Patient is a 43 y.o. female here for f/u asthma.  Had been using albuterol freq at last visit 1 mo ago. She is taking Zyrtec daily. Was told to go back on Flovent daily. Was also having apneic episodes at that time, but thought it would be related to poorly controlled asthma.   Still using albuterol several times weekly. No longer using nightly SABA. Taking Flovent in AM only. Much better overall, but still having some issues. Remembers to rinse mouth out after use. No AE's noted. No current sob or wheezing.  Has had 2 migraines since last visit despite being relatively ha free for many years. She will sometimes get auras. Usually has spinning and she thinks she is having a stroke. Was rx'd Imitrex in past that did not help. She was never on a daily medication Excedrin and ibuprofen are other options she uses. Has been more stressed lately.    ROS: Neuro: Denies current HA Lungs: Denies SOB   Past Medical History:  Diagnosis Date  . Anal skin tag   . Headache   . History of hay fever   . History of migraine   . Moderate persistent asthma 12/20/2016    Objective: BP 108/64 (BP Location: Left Arm, Patient Position: Sitting, Cuff Size: Normal)   Pulse 84   Temp 97.9 F (36.6 C) (Temporal)   Ht 5\' 5"  (1.651 m)   Wt 159 lb 4 oz (72.2 kg)   SpO2 96%   BMI 26.50 kg/m  General: Awake, appears stated age HEENT: MMM, EOMi Heart: RRR Lungs: CTAB, no rales, wheezes or rhonchi. No accessory muscle use Neuro: DTR's equal and symmetric throughout, no clonus, no cerebellar signs, gait normal  MSK: No ttp in neck, jaw, head area Psych: Age appropriate judgment and insight, normal affect and mood  Assessment and Plan: Moderate persistent asthma without complication  Vestibular migraine - Plan: rizatriptan (MAXALT) 10 MG tablet  1- needs to take ICS bid. Cont SABA prn. Goal is <3/week and no nighttime usage. May need to  add LABA if no improvement over next 2-3 weeks with twice daily usage. Offered flu shot but she refused. 2- Maxalt for prn use. F/u for this prn.  The patient voiced understanding and agreement to the plan.  [place], DO 06/09/19  8:47 AM

## 2019-07-02 ENCOUNTER — Telehealth: Payer: Self-pay | Admitting: Family Medicine

## 2019-07-02 NOTE — Telephone Encounter (Signed)
albuterol (PROVENTIL) (2.5 MG/3ML) 0.083% nebulizer solution     Patient requesting call back from CMA to discuss this medication. She states this is not the inhaler that she previously used and inquired if it has been changed.

## 2019-07-02 NOTE — Telephone Encounter (Signed)
She picked up today Albuterol HFA proventil NH inhale one puff every 6 hrs prn. 90 mcg. This is not what she usually picks up it is usually the Albuterol Ventolin inhaler (on her med list). She is not sure why this was given to her and is she to use it in place of Ventolin or a new one or just what?

## 2019-07-02 NOTE — Telephone Encounter (Signed)
She has 2 types of albuterol. One is an inhaler and the other is for a nebulization machine. Is she out of one?

## 2019-07-02 NOTE — Telephone Encounter (Signed)
Copied from [place] [id number]. Topic: General - Other >> Jul 02, 2019  2:05 PM Rainey Pines A wrote: Patient returned Virl Cagey call and would like a callback See telephone note

## 2019-07-02 NOTE — Telephone Encounter (Signed)
See telephone note.

## 2019-07-02 NOTE — Telephone Encounter (Signed)
Called left message to call back 

## 2019-07-02 NOTE — Telephone Encounter (Signed)
Informed per PCP that ok to take as the same thing The patient verbalized understanding.

## 2019-07-04 IMAGING — US US SOFT TISSUE HEAD/NECK
1 series · 14 of 25 positions shown · non-contrast
Comparison: None.

CLINICAL DATA: 42-year-old female with palpable lateral left neck
lumps noticed in mid [place]. Tenderness.

EXAM:
ULTRASOUND OF HEAD/NECK SOFT TISSUES
TECHNIQUE: Ultrasound examination of the head and neck soft tissues was
performed in the area of clinical concern.

[Series 1: us soft tissue head/neck · 0.04mm/px · 14 of 49 slices shown]
[im 1/49]
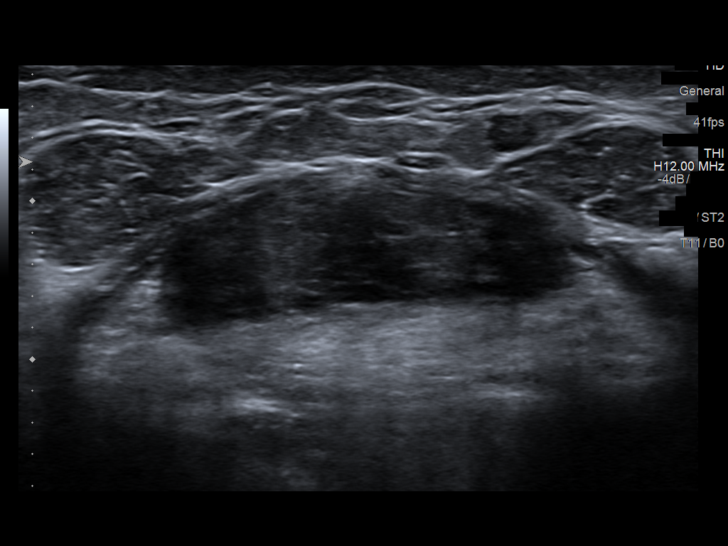
[im 5/49]
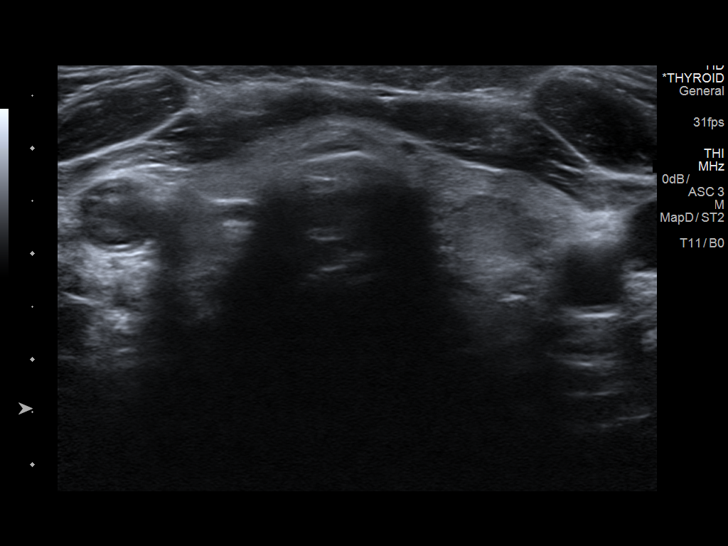
[im 9/49]
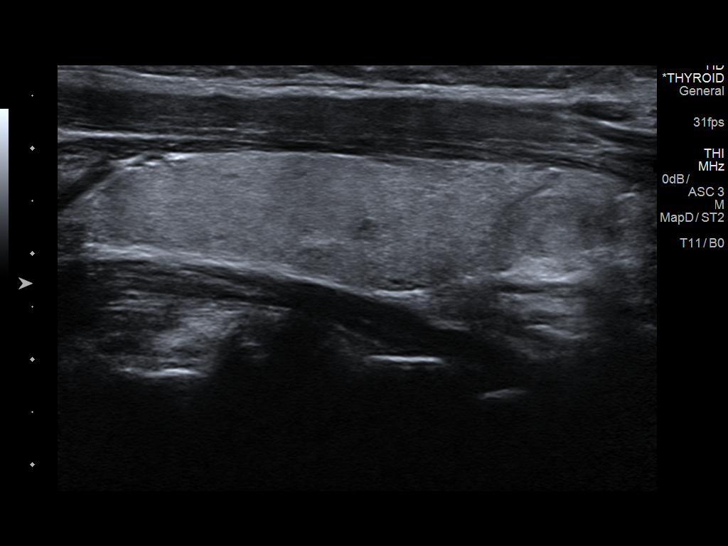
[im 13/49]
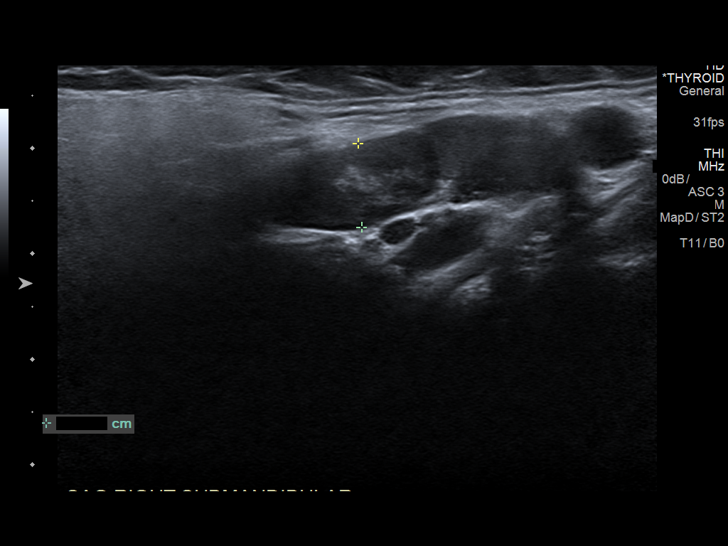
[im 17/49]
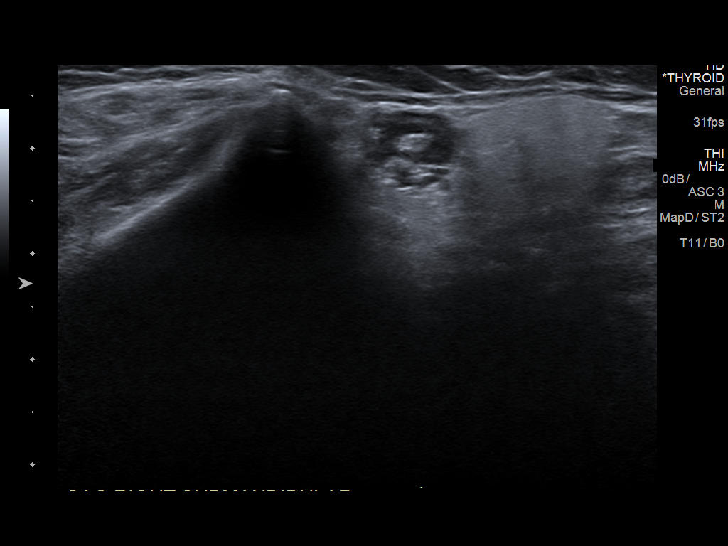
[im 19/49]
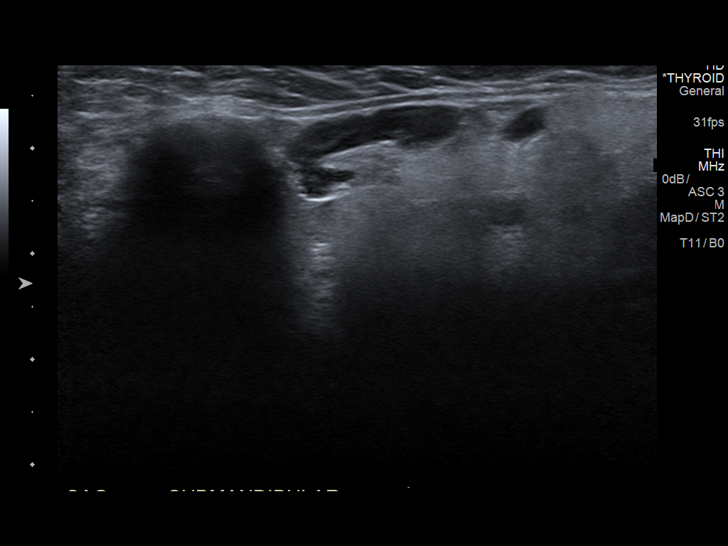
[im 23/49]
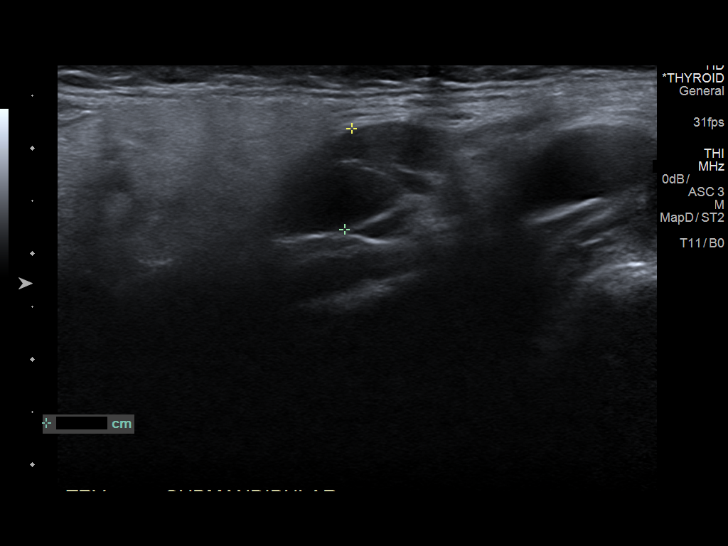
[im 27/49]
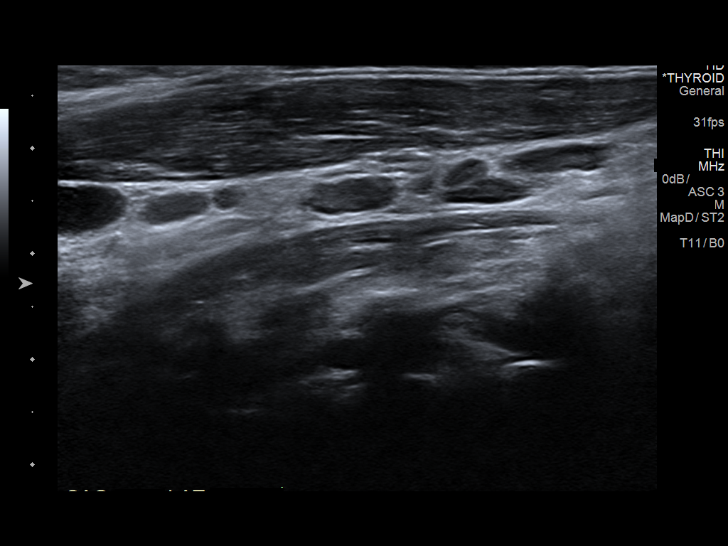
[im 31/49]
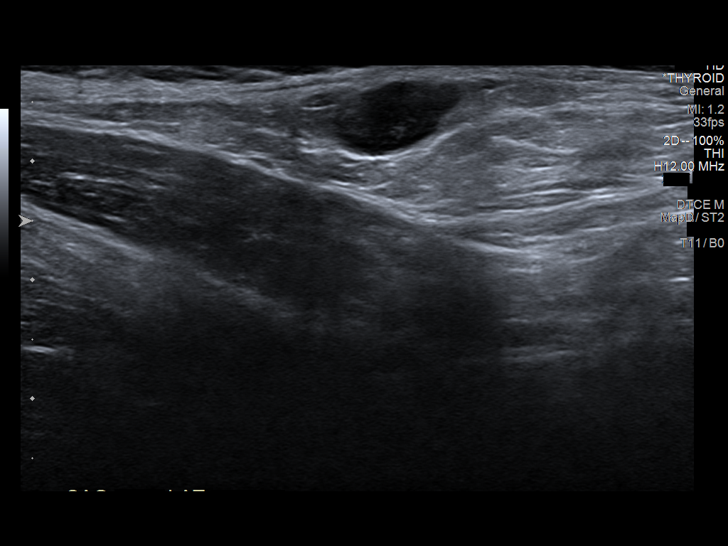
[im 33/49]
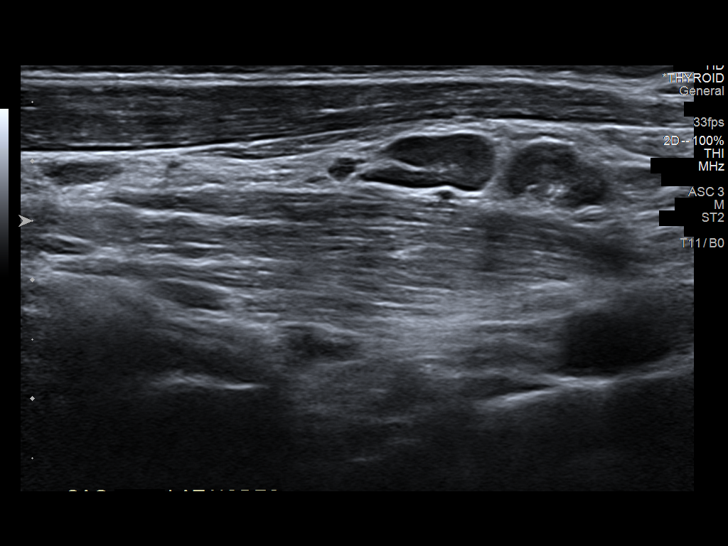
[im 37/49]
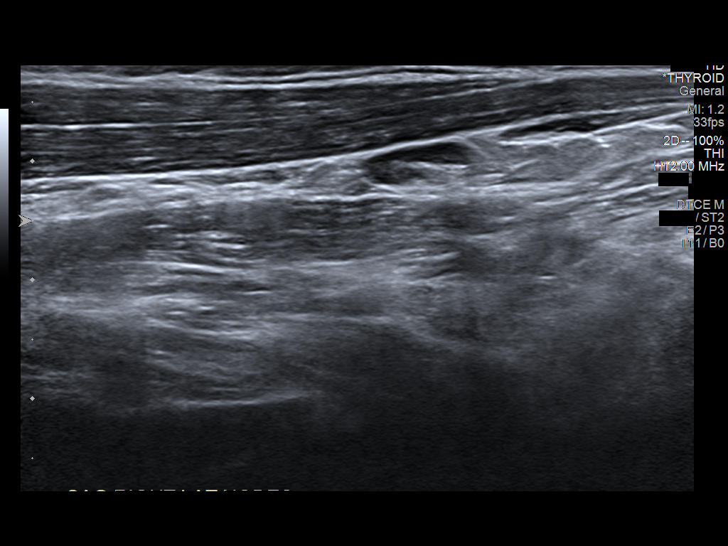
[im 41/49]
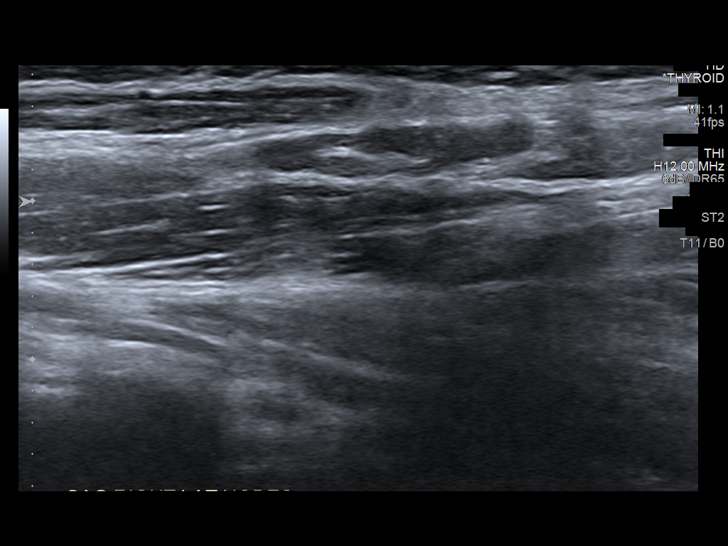
[im 45/49]
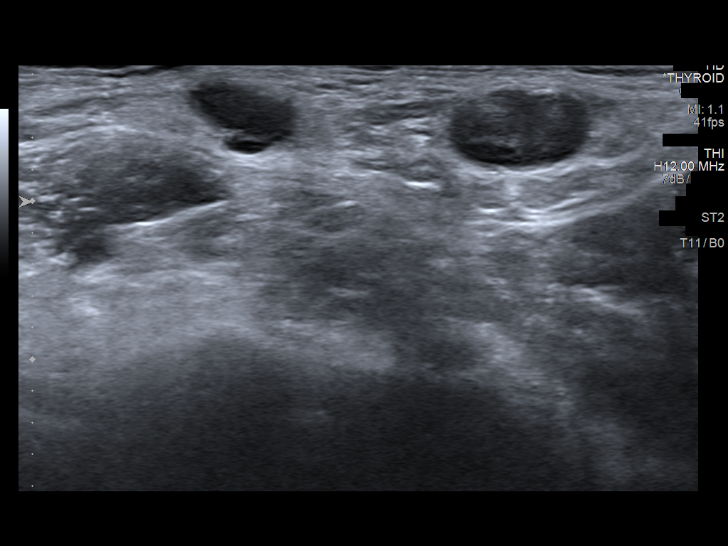
[im 49/49]
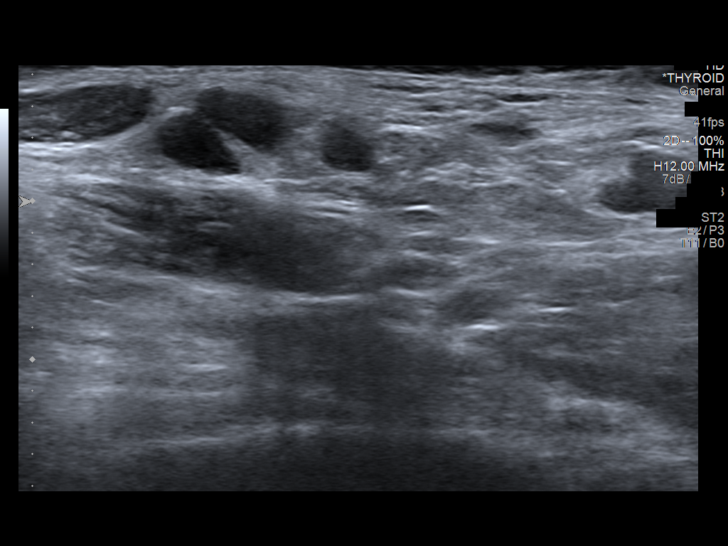

[14 of 25 positions shown; findings below may reference images not displayed]

FINDINGS: Grayscale and brief color Doppler images of the neck demonstrate
subcentimeter lymph nodes which appear fairly bilaterally symmetric.
Nodes measure up to 8 millimeter short axis with visible echogenic
hilum. At the palpable area of the left lateral neck lymph nodes
measure 3-6 millimeter short axis.

Incidentally noted is an 11 millimeter hypoechoic nodule in the left
lower pole of the thyroid (image 11).

No neck mass or pathologic lymph nodes identified.
IMPRESSION: 1. Negative; physiologic appearing lymph nodes at the palpable area
of concern and in the contralateral right neck.
2. Incidental 11 mm left lower pole thyroid nodule identified, no
further evaluation indicated as per ACR consensus guidelines:
Managing Incidental Thyroid Nodules Detected on Imaging: White Paper
of [REDACTED]. [HOSPITAL]

## 2019-07-26 ENCOUNTER — Other Ambulatory Visit: Payer: Self-pay

## 2019-07-26 ENCOUNTER — Other Ambulatory Visit: Payer: Self-pay | Admitting: Family Medicine

## 2019-07-26 ENCOUNTER — Ambulatory Visit
Admission: RE | Admit: 2019-07-26 | Discharge: 2019-07-26 | Disposition: A | Discharge status: Home / Self Care | Payer: BLUE CROSS/BLUE SHIELD | Source: Ambulatory Visit | Attending: Family Medicine | Admitting: Family Medicine

## 2019-07-26 DIAGNOSIS — N6310 Unspecified lump in the right breast, unspecified quadrant: Secondary | ICD-10-CM

## 2019-08-18 ENCOUNTER — Ambulatory Visit (INDEPENDENT_AMBULATORY_CARE_PROVIDER_SITE_OTHER): Payer: BC Managed Care – PPO | Admitting: Family Medicine

## 2019-08-18 ENCOUNTER — Encounter: Payer: Self-pay | Admitting: Family Medicine

## 2019-08-18 DIAGNOSIS — G43809 Other migraine, not intractable, without status migrainosus: Secondary | ICD-10-CM | POA: Insufficient documentation

## 2019-08-18 DIAGNOSIS — J4540 Moderate persistent asthma, uncomplicated: Secondary | ICD-10-CM | POA: Diagnosis not present

## 2019-08-18 MED ORDER — FLUTICASONE PROPIONATE HFA 110 MCG/ACT IN AERO: 2.0000 | INHALATION_SPRAY | Freq: Two times a day (BID) | RESPIRATORY_TRACT | 5 refills | Status: DC

## 2019-08-18 MED ORDER — PROPRANOLOL HCL ER 60 MG PO CP24: 60.0000 mg | ORAL_CAPSULE | Freq: Every day | ORAL | 2 refills | Status: DC

## 2019-08-18 MED ORDER — ZOLMITRIPTAN 5 MG PO TABS: 5.0000 mg | ORAL_TABLET | ORAL | 3 refills | Status: DC | PRN

## 2019-08-18 NOTE — Progress Notes (Signed)
Chief complaint: asthma  Subjective: Patient is a 43 y.o. female here for asthma and headaches. Due to COVID-19 pandemic, we are interacting via web portal for an electronic face-to-face visit. I verified patient's ID using 2 identifiers. Patient agreed to proceed with visit via this method. Patient is at home, I am at office. Patient and I are present for visit.   Over past mo, pt's allergies and asthma have been flaring. Compliant w INCS, Zyrtec and Singulair. Using albuterol daily. Did not start back on Flovent, which has helped worsening asthma in past. No current wheezing or fevers. Nighttime awakenings an issue w coughing.   +hx of migraines. More freq over past mo. No change in stress levels or any other meds. Associated blurry vision, dizziness, light sensitivity and nausea, which is typical for her. Nothing has helped, including Maxalt. No AE's though. Does not have a ha currently.   ROS: Const: no fevers Lungs: Denies current SOB   Past Medical History:  Diagnosis Date  . Anal skin tag   . Headache   . History of hay fever   . History of migraine   . Moderate persistent asthma 12/20/2016    Objective: No conversational dyspnea Age appropriate judgment and insight Nml affect and mood  Assessment and Plan: Vestibular migraine - Plan: zolmitriptan (ZOMIG) 5 MG tablet, propranolol ER (INDERAL LA) 60 MG 24 hr capsule  Moderate persistent asthma without complication - Plan: fluticasone (FLOVENT HFA) 110 MCG/ACT inhaler  1- Try a diff triptan. Add BB for prophylaxis. Avoid triggers if able. Would consider Nurtec or Ubrelvy if no improvement w acute tx. Would consider Topamax vs Mg vs Elavil for prophylactic if no improvement. 2.  Go back on Flovent. Remember to rinse out mouth. Stay on allergy reg. If still hurting, will add LABA to ICS.  F/u in 1 mo.  The patient voiced understanding and agreement to the plan.  [place], DO 08/18/19  9:31 AM

## 2019-08-29 ENCOUNTER — Emergency Department (HOSPITAL_BASED_OUTPATIENT_CLINIC_OR_DEPARTMENT_OTHER): Payer: BC Managed Care – PPO

## 2019-08-29 ENCOUNTER — Other Ambulatory Visit: Payer: Self-pay

## 2019-08-29 ENCOUNTER — Emergency Department (HOSPITAL_BASED_OUTPATIENT_CLINIC_OR_DEPARTMENT_OTHER)
Admission: EM | Admit: 2019-08-29 | Discharge: 2019-08-29 | Disposition: A | Discharge status: Home / Self Care | Payer: BC Managed Care – PPO | Attending: Emergency Medicine | Admitting: Emergency Medicine

## 2019-08-29 ENCOUNTER — Encounter (HOSPITAL_BASED_OUTPATIENT_CLINIC_OR_DEPARTMENT_OTHER): Payer: Self-pay | Admitting: Emergency Medicine

## 2019-08-29 DIAGNOSIS — M5412 Radiculopathy, cervical region: Secondary | ICD-10-CM | POA: Insufficient documentation

## 2019-08-29 DIAGNOSIS — R05 Cough: Secondary | ICD-10-CM | POA: Diagnosis not present

## 2019-08-29 DIAGNOSIS — R0789 Other chest pain: Secondary | ICD-10-CM | POA: Insufficient documentation

## 2019-08-29 DIAGNOSIS — Z79899 Other long term (current) drug therapy: Secondary | ICD-10-CM | POA: Diagnosis not present

## 2019-08-29 DIAGNOSIS — R079 Chest pain, unspecified: Secondary | ICD-10-CM | POA: Diagnosis not present

## 2019-08-29 DIAGNOSIS — M542 Cervicalgia: Secondary | ICD-10-CM | POA: Diagnosis not present

## 2019-08-29 LAB — BASIC METABOLIC PANEL
Anion gap: 8 (ref 5–15)
BUN: 9 mg/dL (ref 6–20)
CO2: 27 mmol/L (ref 22–32)
Calcium: 9.5 mg/dL (ref 8.9–10.3)
Chloride: 103 mmol/L (ref 98–111)
Creatinine, Ser: 0.69 mg/dL (ref 0.44–1.00)
GFR calc Af Amer: >60 mL/min (ref >60)
GFR calc non Af Amer: >60 mL/min (ref >60)
Glucose, Bld: 106 mg/dL — ABNORMAL HIGH (ref 70–99)
Potassium: 4.3 mmol/L (ref 3.5–5.1)
Sodium: 138 mmol/L (ref 135–145)

## 2019-08-29 LAB — TROPONIN I (HIGH SENSITIVITY)
Troponin I (High Sensitivity): <2 ng/L (ref <18)
Troponin I (High Sensitivity): <2 ng/L (ref <18)

## 2019-08-29 LAB — CBC
HCT: 38.8 % (ref 36.0–46.0)
Hemoglobin: 12.1 g/dL (ref 12.0–15.0)
MCH: 27 pg (ref 26.0–34.0)
MCHC: 31.2 g/dL (ref 30.0–36.0)
MCV: 86.6 fL (ref 80.0–100.0)
Platelets: 295 10*3/uL (ref 150–400)
RBC: 4.48 MIL/uL (ref 3.87–5.11)
RDW: 14 % (ref 11.5–15.5)
WBC: 7.5 10*3/uL (ref 4.0–10.5)
nRBC: 0 % (ref 0.0–0.2)

## 2019-08-29 MED ORDER — PREDNISONE 10 MG PO TABS: ORAL_TABLET | ORAL | 0 refills | Status: DC

## 2019-08-29 NOTE — Discharge Instructions (Addendum)
Return if any problems.  See your Physician for recheck  °

## 2019-08-29 NOTE — ED Triage Notes (Signed)
Intermittent chest pain and L arm numbness for 2 days.

## 2019-08-29 NOTE — ED Provider Notes (Signed)
[facility]PART[facility] Provider Note   CSN: [id number] Arrival date & time: 08/29/19  1134     History Chief Complaint  Patient presents with  . Chest Pain    Kimberly Ali is a 44 y.o. female.  The history is provided by the patient.  Chest Pain Pain location:  L chest Pain quality: aching   Pain radiates to:  L shoulder Pain severity:  Mild Onset quality:  Gradual Timing:  Constant Progression:  Worsening Chronicity:  New Relieved by:  Nothing Ineffective treatments:  None tried Associated symptoms: no cough and no nausea   Pt has soreness in her shoulder and in her neck.  Pt reports pain in her chest to her neck and shoulder.  Pt reports tingling in her fingers.      Past Medical History:  Diagnosis Date  . Anal skin tag   . Headache   . History of hay fever   . History of migraine   . Moderate persistent asthma 12/20/2016    Patient Active Problem List   Diagnosis Date Noted  . 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 Moderate persistent asthma 12/20/2016    Past Surgical History:  Procedure Laterality Date  . ABDOMINAL HYSTERECTOMY     Fibroids  . CERVIX SURGERY     x2  . DILATION AND CURETTAGE OF UTERUS    . EXCISION OF SKIN TAG N/A 07/23/2017   Procedure: EXCISION OF SKIN TAG;  Surgeon: Clovis Riley, MD;  Location: [facility];  Service: General;  Laterality: N/A;     OB History   No obstetric history on file.     Family History  Problem Relation Age of Onset  . Cancer Father        prostate  . Hypertension Father   . Diabetes Father   . Alcohol abuse Maternal Grandmother   . Alcohol abuse Maternal Grandfather     Social History   Tobacco Use  . Smoking status: Never Smoker  . Smokeless tobacco: Never Used  Substance Use Topics  . Alcohol use: Yes    Comment: social  . Drug use: No    Home Medications Prior to Admission medications   Medication Sig Start Date End Date Taking?  Authorizing Provider  albuterol (PROVENTIL) (2.5 MG/3ML) 0.083% nebulizer solution Take 3 mLs (2.5 mg total) by nebulization every 6 (six) hours as needed for wheezing or shortness of breath. 12/14/18   Shelda Pal, DO  albuterol (VENTOLIN HFA) 108 (90 Base) MCG/ACT inhaler Inhale 1 puff into the lungs every 6 (six) hours as needed for wheezing or shortness of breath. Please specify directions, refills and quantity 05/12/19   Wendling, Crosby Oyster, DO  cetirizine (ZYRTEC) 10 MG tablet Take 1 tablet (10 mg total) by mouth daily. 12/23/18   Shelda Pal, DO  fluticasone (FLOVENT HFA) 110 MCG/ACT inhaler Inhale 2 puffs into the lungs 2 (two) times daily. 08/18/19   Shelda Pal, DO  montelukast (SINGULAIR) 10 MG tablet Take 1 tablet (10 mg total) by mouth at bedtime. 12/14/18   Shelda Pal, DO  propranolol ER (INDERAL LA) 60 MG 24 hr capsule Take 1 capsule (60 mg total) by mouth daily. 08/18/19   Shelda Pal, DO  zolmitriptan (ZOMIG) 5 MG tablet Take 1 tablet (5 mg total) by mouth as needed for migraine. 08/18/19   Shelda Pal, DO    Allergies    Patient has no known allergies.  Review of  Systems   Review of Systems  Respiratory: Negative for cough.   Cardiovascular: Positive for chest pain.  Gastrointestinal: Negative for nausea.  All other systems reviewed and are negative.   Physical Exam Updated Vital Signs BP (!) 141/89   Pulse 78   Temp 99.4 F (37.4 C) (Oral)   Resp 14   Ht 5\' 5"  (1.651 m)   Wt 72.6 kg   SpO2 100%   BMI 26.63 kg/m   Physical Exam Vitals and nursing note reviewed.  Constitutional:      Appearance: She is well-developed.  HENT:     Head: Normocephalic.  Cardiovascular:     Rate and Rhythm: Normal rate.     Heart sounds: Normal heart sounds.  Pulmonary:     Effort: Pulmonary effort is normal.     Breath sounds: Normal breath sounds.  Abdominal:     General: There is no distension.      Palpations: Abdomen is soft.  Musculoskeletal:        General: Normal range of motion.     Cervical back: Normal range of motion.  Skin:    General: Skin is warm.  Neurological:     General: No focal deficit present.     Mental Status: She is alert and oriented to person, place, and time.  Psychiatric:        Mood and Affect: Mood normal.     ED Results / Procedures / Treatments   Labs (all labs ordered are listed, but only abnormal results are displayed) Labs Reviewed  BASIC METABOLIC PANEL - Abnormal; Notable for the following components:      Result Value   Glucose, Bld 106 (*)    All other components within normal limits  CBC  TROPONIN I (HIGH SENSITIVITY)  TROPONIN I (HIGH SENSITIVITY)    EKG None  Radiology DG Cervical Spine Complete  Result Date: 08/29/2019 CLINICAL DATA:  Left arm numbness and tingling for 3 days. Pain. No trauma. EXAM: CERVICAL SPINE - COMPLETE 4+ VIEW COMPARISON:  None. FINDINGS: The lateral view images through the bottom of C6. Prevertebral soft tissues are within normal limits. straightening of expected cervical lordosis. Maintenance of vertebral body height. Endplate osteophytes with loss of intervertebral disc height at C2-3 and C5-6. Lateral masses and odontoid process intact. On the left, neural foraminal narrowing at C5-6 secondary to uncovertebral joint hypertrophy. On the right, C4-5 and C5-6 uncovertebral joint hypertrophy causes neural foraminal narrowing. IMPRESSION: Age advanced cervical spondylosis resulting in bilateral neural foraminal narrowing. Nonspecific straightening of expected cervical lordosis. Electronically Signed   By: Abigail Miyamoto M.D.   On: 08/29/2019 15:28   DG Chest Portable 1 View  Result Date: 08/29/2019 CLINICAL DATA:  Left arm numbness off and on x 3 days; no h/o hypertension; h/o asthma; no fever; no cough; EXAM: PORTABLE CHEST 1 VIEW COMPARISON:  None. FINDINGS: Normal mediastinal contours. Heart size is upper limits  of normal. The lungs are clear. No pneumothorax or significant pleural effusion. No acute finding in the visualized skeleton. IMPRESSION: 1.  Heart size is upper limits of normal. 2.  No evidence of active disease or edema. Electronically Signed   By: Audie Pinto M.D.   On: 08/29/2019 12:59    Procedures Procedures (including critical care time)  Medications Ordered in ED Medications - No data to display  ED Course  I have reviewed the triage vital signs and the nursing notes.  Pertinent labs & imaging results that were available during my  care of the patient were reviewed by me and considered in my medical decision making (see chart for details).    MDM Rules/Calculators/A&P                      MDM  ekg and chest xray are normal  Troponin is negative x 2. C spine shows degenerative changes.  I suspect arm pain and tingling are due to neck issue.  I will try pt on prednsione taper.  Pt advised to seee her MD for recheck  Final Clinical Impression(s) / ED Diagnoses Final diagnoses:  Atypical chest pain  Cervical radiculopathy    Rx / DC Orders ED Discharge Orders         Ordered    predniSONE (DELTASONE) 10 MG tablet     08/29/19 1549        An After Visit Summary was printed and given to the patient.    Sidney Ace 08/29/19 1550    Davonna Belling, MD 08/30/19 1505

## 2019-08-29 NOTE — ED Notes (Signed)
ED Provider at bedside. 

## 2019-09-03 ENCOUNTER — Encounter: Payer: Self-pay | Admitting: Family Medicine

## 2019-09-03 ENCOUNTER — Other Ambulatory Visit: Payer: Self-pay

## 2019-09-03 ENCOUNTER — Ambulatory Visit (INDEPENDENT_AMBULATORY_CARE_PROVIDER_SITE_OTHER): Payer: BC Managed Care – PPO | Admitting: Family Medicine

## 2019-09-03 DIAGNOSIS — M47812 Spondylosis without myelopathy or radiculopathy, cervical region: Secondary | ICD-10-CM

## 2019-09-03 DIAGNOSIS — M6281 Muscle weakness (generalized): Secondary | ICD-10-CM | POA: Diagnosis not present

## 2019-09-03 NOTE — Progress Notes (Signed)
Chief Complaint  Patient presents with  . Hospitalization Follow-up    ER needs MRI of her neck    Subjective: Patient is a 44 y.o. female here for ED f/u. Due to COVID-19 pandemic, we are interacting via web portal for an electronic face-to-face visit. I verified patient's ID using 2 identifiers. Patient agreed to proceed with visit via this method. Patient is at home, I am at office. Patient and I are present for visit.   Pt seen by me a few weeks ago for vest migraines. It progressively worsened with neck pain and weakness in UE's uncommon to her migraines. She went to ED and imaging revealed cerv spondylosis. MRI rec'd. She is able to lift hands, but it is more difficult to maintain grip. No inj or change in activity. Meds have helped w pain.   ROS: MSK: +neck pain Neuro: +weakness in LUE  Past Medical History:  Diagnosis Date  . Anal skin tag   . Headache   . History of hay fever   . History of migraine   . Moderate persistent asthma 12/20/2016    Objective: No conversational dyspnea Age appropriate judgment and insight Nml affect and mood  Assessment and Plan: Cervical spondylosis - Plan: Ambulatory referral to Physical Therapy, MR NECK WO CONTRAST  Muscle weakness of left upper extremity - Plan: Ambulatory referral to Physical Therapy, MR NECK WO CONTRAST  Will refer to PT. While I cannot examine her, it does sound like she can raise her arms so she is not an immediate surg candidate. Will try to f/u with abn imaging w MRI.  F/u as originally scheduled.  The patient voiced understanding and agreement to the plan.  [place], DO 09/03/19  10:07 AM

## 2019-09-06 ENCOUNTER — Encounter: Payer: Self-pay | Admitting: Family Medicine

## 2019-09-29 ENCOUNTER — Other Ambulatory Visit: Payer: Self-pay | Admitting: Family Medicine

## 2019-09-29 DIAGNOSIS — M47812 Spondylosis without myelopathy or radiculopathy, cervical region: Secondary | ICD-10-CM

## 2019-09-29 DIAGNOSIS — M6281 Muscle weakness (generalized): Secondary | ICD-10-CM

## 2019-10-04 ENCOUNTER — Encounter: Payer: Self-pay | Admitting: Family Medicine

## 2019-11-13 ENCOUNTER — Ambulatory Visit (HOSPITAL_BASED_OUTPATIENT_CLINIC_OR_DEPARTMENT_OTHER): Payer: BC Managed Care – PPO

## 2019-11-18 ENCOUNTER — Telehealth: Payer: Self-pay | Admitting: Family Medicine

## 2019-11-18 NOTE — Telephone Encounter (Signed)
Patient states that her MRI was cancel , please advise. Patient states that she need her MRI.  Please advise

## 2019-11-19 NOTE — Telephone Encounter (Signed)
Why was it cancelled? I thought insurance approved it? Does she just need to reschedule?

## 2019-11-23 NOTE — Telephone Encounter (Signed)
Will forward most recent staff msg. To PCP regarding this

## 2019-11-25 ENCOUNTER — Encounter: Payer: Self-pay | Admitting: Family Medicine

## 2019-11-25 ENCOUNTER — Emergency Department (HOSPITAL_COMMUNITY): Payer: BC Managed Care – PPO

## 2019-11-25 ENCOUNTER — Telehealth: Payer: Self-pay

## 2019-11-25 ENCOUNTER — Other Ambulatory Visit: Payer: Self-pay

## 2019-11-25 ENCOUNTER — Emergency Department (HOSPITAL_COMMUNITY)
Admission: EM | Admit: 2019-11-25 | Discharge: 2019-11-25 | Disposition: A | Discharge status: Home / Self Care | Payer: BC Managed Care – PPO | Attending: Emergency Medicine | Admitting: Emergency Medicine

## 2019-11-25 ENCOUNTER — Encounter (HOSPITAL_COMMUNITY): Payer: Self-pay

## 2019-11-25 ENCOUNTER — Ambulatory Visit (INDEPENDENT_AMBULATORY_CARE_PROVIDER_SITE_OTHER): Payer: BC Managed Care – PPO | Admitting: Family Medicine

## 2019-11-25 VITALS — BP 136/86 | HR 92 | Temp 95.2°F | Ht 65.0 in | Wt 159.5 lb

## 2019-11-25 DIAGNOSIS — J45901 Unspecified asthma with (acute) exacerbation: Secondary | ICD-10-CM | POA: Diagnosis not present

## 2019-11-25 DIAGNOSIS — R42 Dizziness and giddiness: Secondary | ICD-10-CM | POA: Diagnosis not present

## 2019-11-25 DIAGNOSIS — R569 Unspecified convulsions: Secondary | ICD-10-CM

## 2019-11-25 DIAGNOSIS — M47812 Spondylosis without myelopathy or radiculopathy, cervical region: Secondary | ICD-10-CM

## 2019-11-25 DIAGNOSIS — R29898 Other symptoms and signs involving the musculoskeletal system: Secondary | ICD-10-CM

## 2019-11-25 DIAGNOSIS — J4540 Moderate persistent asthma, uncomplicated: Secondary | ICD-10-CM | POA: Diagnosis not present

## 2019-11-25 DIAGNOSIS — R404 Transient alteration of awareness: Secondary | ICD-10-CM | POA: Diagnosis not present

## 2019-11-25 DIAGNOSIS — G40909 Epilepsy, unspecified, not intractable, without status epilepticus: Secondary | ICD-10-CM | POA: Diagnosis not present

## 2019-11-25 DIAGNOSIS — R4020 Unspecified coma: Secondary | ICD-10-CM | POA: Diagnosis not present

## 2019-11-25 DIAGNOSIS — Z0389 Encounter for observation for other suspected diseases and conditions ruled out: Secondary | ICD-10-CM | POA: Diagnosis not present

## 2019-11-25 DIAGNOSIS — R0902 Hypoxemia: Secondary | ICD-10-CM | POA: Diagnosis not present

## 2019-11-25 DIAGNOSIS — R55 Syncope and collapse: Secondary | ICD-10-CM | POA: Diagnosis not present

## 2019-11-25 DIAGNOSIS — R0602 Shortness of breath: Secondary | ICD-10-CM | POA: Diagnosis not present

## 2019-11-25 LAB — COMPREHENSIVE METABOLIC PANEL
ALT: 17 U/L (ref 0–44)
AST: 21 U/L (ref 15–41)
Albumin: 3.9 g/dL (ref 3.5–5.0)
Alkaline Phosphatase: 49 U/L (ref 38–126)
Anion gap: 10 (ref 5–15)
BUN: 9 mg/dL (ref 6–20)
CO2: 24 mmol/L (ref 22–32)
Calcium: 8.7 mg/dL — ABNORMAL LOW (ref 8.9–10.3)
Chloride: 103 mmol/L (ref 98–111)
Creatinine, Ser: 0.86 mg/dL (ref 0.44–1.00)
GFR calc Af Amer: >60 mL/min (ref >60)
GFR calc non Af Amer: >60 mL/min (ref >60)
Glucose, Bld: 240 mg/dL — ABNORMAL HIGH (ref 70–99)
Potassium: 4 mmol/L (ref 3.5–5.1)
Sodium: 137 mmol/L (ref 135–145)
Total Bilirubin: 0.5 mg/dL (ref 0.3–1.2)
Total Protein: 6.6 g/dL (ref 6.5–8.1)

## 2019-11-25 LAB — CBC
HCT: 37.1 % (ref 36.0–46.0)
Hemoglobin: 11.4 g/dL — ABNORMAL LOW (ref 12.0–15.0)
MCH: 26.6 pg (ref 26.0–34.0)
MCHC: 30.7 g/dL (ref 30.0–36.0)
MCV: 86.5 fL (ref 80.0–100.0)
Platelets: 304 10*3/uL (ref 150–400)
RBC: 4.29 MIL/uL (ref 3.87–5.11)
RDW: 13.4 % (ref 11.5–15.5)
WBC: 13.8 10*3/uL — ABNORMAL HIGH (ref 4.0–10.5)
nRBC: 0 % (ref 0.0–0.2)

## 2019-11-25 LAB — URINALYSIS, ROUTINE W REFLEX MICROSCOPIC
Bacteria, UA: NONE SEEN
Bilirubin Urine: NEGATIVE
Glucose, UA: >=500 mg/dL — AB
Hgb urine dipstick: NEGATIVE
Ketones, ur: NEGATIVE mg/dL
Leukocytes,Ua: NEGATIVE
Nitrite: NEGATIVE
Protein, ur: 30 mg/dL — AB
Specific Gravity, Urine: 1.007 (ref 1.005–1.030)
pH: 7 (ref 5.0–8.0)

## 2019-11-25 LAB — I-STAT BETA HCG BLOOD, ED (MC, WL, AP ONLY): I-stat hCG, quantitative: <5 m[IU]/mL (ref <5)

## 2019-11-25 MED ORDER — PREDNISONE 20 MG PO TABS: 60.0000 mg | ORAL_TABLET | Freq: Once | ORAL | Status: DC

## 2019-11-25 MED ORDER — ALBUTEROL SULFATE HFA 108 (90 BASE) MCG/ACT IN AERS: 1.0000 | INHALATION_SPRAY | Freq: Four times a day (QID) | RESPIRATORY_TRACT | 1 refills | Status: DC | PRN

## 2019-11-25 MED ORDER — SODIUM CHLORIDE 0.9 % IV BOLUS
1000.0000 mL | Freq: Once | INTRAVENOUS | Status: AC
  Administered 2019-11-25: 08:00:00 1000 mL via INTRAVENOUS

## 2019-11-25 MED ORDER — AZELASTINE HCL 0.1 % NA SOLN: 2.0000 | Freq: Two times a day (BID) | NASAL | 12 refills | Status: DC

## 2019-11-25 MED ORDER — ONDANSETRON HCL 4 MG/2ML IJ SOLN
4.0000 mg | Freq: Once | INTRAMUSCULAR | Status: AC
  Administered 2019-11-25: 4 mg via INTRAVENOUS
  Filled 2019-11-25: qty 2

## 2019-11-25 MED ORDER — ALBUTEROL SULFATE (2.5 MG/3ML) 0.083% IN NEBU: 5.0000 mg | INHALATION_SOLUTION | Freq: Once | RESPIRATORY_TRACT | Status: DC

## 2019-11-25 NOTE — ED Triage Notes (Signed)
Pt BIB [facility] from home for [facility] & seizure like activity. Pt describes SHOB as an exacerbation of asthma/ asthma attack. Pt has hx of same, but states work of breathing has since decreased & she is able to breathe better. Upon walking to stretcher, pt had witnessed seizure-like activity & episode of incontinence, 7min postictal period. Pt denies hx of seizure. Pt given 3 albuterol tx, mag & solumedrol en route. VSS

## 2019-11-25 NOTE — Patient Instructions (Signed)
We will try to order the MRI again.   Agree with no driving until you see the neurologist.   Let us know if you need anything.

## 2019-11-25 NOTE — ED Provider Notes (Signed)
[facility] Emergency Department Provider Note MRN:  [id number]  Arrival date & time: 11/25/19     Chief Complaint   Shortness of Breath and seizure-like activity   History of Present Illness   Kimberly Ali is a 44 y.o. year-old female with a history of asthma presenting to the ED with chief complaint of shortness of.  2 or 3 days of worsening shortness of breath, out of inhaler at home.  Chest tightness.  Was feeling very short of breath and then had a episode of loss of consciousness, witnessed shaking behavior and incontinence, was reportedly somnolent 10 minutes after the shaking behavior.  Denies any history of seizures, drug or alcohol use, no numbness or weakness to the arms or legs.  Review of Systems  A complete 10 system review of systems was obtained and all systems are negative except as noted in the HPI and PMH.   Patient's Health History    Past Medical History:  Diagnosis Date  . Anal skin tag   . Headache   . History of hay fever   . History of migraine   . Moderate persistent asthma 12/20/2016    Past Surgical History:  Procedure Laterality Date  . ABDOMINAL HYSTERECTOMY     Fibroids  . CERVIX SURGERY     x2  . DILATION AND CURETTAGE OF UTERUS    . EXCISION OF SKIN TAG N/A 07/23/2017   Procedure: EXCISION OF SKIN TAG;  Surgeon: Clovis Riley, MD;  Location: [place];  Service: General;  Laterality: N/A;    Family History  Problem Relation Age of Onset  . Cancer Father        prostate  . Hypertension Father   . Diabetes Father   . Alcohol abuse Maternal Grandmother   . Alcohol abuse Maternal Grandfather     Social History   Socioeconomic History  . Marital status: Single    Spouse name: Not on file  . Number of children: Not on file  . Years of education: Not on file  . Highest education level: Not on file  Occupational History  . Not on file  Tobacco Use  . Smoking status: Never Smoker    . Smokeless tobacco: Never Used  Substance and Sexual Activity  . Alcohol use: Yes    Comment: social  . Drug use: No  . Sexual activity: Not on file  Other Topics Concern  . Not on file  Social History Narrative  . Not on file   Social Determinants of Health   Financial Resource Strain:   . Difficulty of Paying Living Expenses:   Food Insecurity:   . Worried About Charity fundraiser in the Last Year:   . Arboriculturist in the Last Year:   Transportation Needs:   . Film/video editor (Medical):   Marland Kitchen Lack of Transportation (Non-Medical):   Physical Activity:   . Days of Exercise per Week:   . Minutes of Exercise per Session:   Stress:   . Feeling of Stress :   Social Connections:   . Frequency of Communication with Friends and Family:   . Frequency of Social Gatherings with Friends and Family:   . Attends Religious Services:   . Active Member of Clubs or Organizations:   . Attends Archivist Meetings:   Marland Kitchen Marital Status:   Intimate Partner Violence:   . Fear of Current or Ex-Partner:   . Emotionally Abused:   .  Physically Abused:   . Sexually Abused:      Physical Exam   Vitals:   11/25/19 0900 11/25/19 0930  BP: (!) 133/92 (!) 133/96  Pulse: 95 95  Resp: 17 13  Temp:    SpO2: 96% 98%    CONSTITUTIONAL: Well-appearing, NAD NEURO:  Alert and oriented x 3, no focal deficits EYES:  eyes equal and reactive ENT/NECK:  no LAD, no JVD CARDIO: Regular rate, well-perfused, normal S1 and S2 PULM:  CTAB no wheezing or rhonchi GI/GU:  normal bowel sounds, non-distended, non-tender MSK/SPINE:  No gross deformities, no edema SKIN:  no rash, atraumatic PSYCH:  Appropriate speech and behavior  *Additional and/or pertinent findings included in MDM below  Diagnostic and Interventional Summary    EKG Interpretation  Date/Time:  Thursday November 25 2019 06:46:02 EDT Ventricular Rate:  102 PR Interval:    QRS Duration: 79 QT Interval:  340 QTC  Calculation: 443 R Axis:   79 Text Interpretation: Sinus tachycardia Right atrial enlargement Borderline T wave abnormalities Confirmed by Gerlene Fee [phone]) on 11/25/2019 8:10:03 AM      Labs Reviewed  CBC - Abnormal; Notable for the following components:      Result Value   WBC 13.8 (*)    Hemoglobin 11.4 (*)    All other components within normal limits  COMPREHENSIVE METABOLIC PANEL - Abnormal; Notable for the following components:   Glucose, Bld 240 (*)    Calcium 8.7 (*)    All other components within normal limits  URINALYSIS, ROUTINE W REFLEX MICROSCOPIC - Abnormal; Notable for the following components:   Color, Urine STRAW (*)    Glucose, UA >=500 (*)    Protein, ur 30 (*)    All other components within normal limits  I-STAT BETA HCG BLOOD, ED ([facility], [facility], [facility] ONLY)    CT HEAD WO CONTRAST  Final Result    DG Chest Port 1 View  Final Result      Medications  sodium chloride 0.9 % bolus 1,000 mL (0 mLs Intravenous Stopped 11/25/19 0900)  ondansetron (ZOFRAN) injection 4 mg (4 mg Intravenous Given 11/25/19 0730)     Procedures  /  Critical Care Procedures  ED Course and Medical Decision Making  I have reviewed the triage vital signs, the nursing notes, and pertinent available records from the EMR.  Pertinent labs & imaging results that were available during my care of the patient were reviewed by me and considered in my medical decision making (see below for details).     Reportedly in respiratory distress related to asthma but looking much better than the report, and no longer wheezing, no increased work of breathing, normal vital signs.  Normal neurological exam.  Patient endorses a history of complex migraines, denies headache.  No history of seizures.  Will need labs, CT head to exclude significant secondary cause of seizure.  Symptoms could possibly be explained by syncope in the setting of shortness of breath however the report of tonic-clonic behavior and postictal  period with incontinence would be more suggestive of seizure.  Upon reassessment patient is looking and feeling well, continues to have normal vital signs.  Clear lungs.  No further seizure activity, CT head is normal, no other secondary causes of seizure.  Referred to neurology, albuterol refilled.  Barth Kirks. Sedonia Small, [place] [email or web address]  Final Clinical Impressions(s) / ED Diagnoses     ICD-10-CM   1. Exacerbation of asthma, unspecified asthma  severity, unspecified whether persistent  J45.901   2. Observed seizure-like activity ([place])  R56.9     ED Discharge Orders         Ordered    Ambulatory referral to Neurology    Comments: An appointment is requested in approximately: 4 weeks   11/25/19 0939    albuterol (VENTOLIN HFA) 108 (90 Base) MCG/ACT inhaler  Every 6 hours PRN     11/25/19 0939           Discharge Instructions Discussed with and Provided to Patient:     Discharge Instructions     You were evaluated in the Emergency Department and after careful evaluation, we did not find any emergent condition requiring admission or further testing in the hospital.  Your exam/testing today is overall reassuring.  As discussed, please follow-up with neurology regarding your seizure-like activity.  You should not drive a car or do other activities that would be dangerous if another episode occurred.  Please return to the Emergency Department if you experience any worsening of your condition.  We encourage you to follow up with a primary care provider.  Thank you for allowing [place] to be a part of your care.       Maudie Flakes, MD 11/25/19 [phone]

## 2019-11-25 NOTE — Progress Notes (Signed)
Chief Complaint  Patient presents with  . Hospitalization Follow-up    asthma attack and seizure    Subjective: Patient is a 44 y.o. female here for ED follow-up.  The patient woke up this morning with an asthma attack.  Her asthma is been worsening over the past several weeks since pollen season started.  She has been using montelukast, Flonase, and Zyrtec.  She also uses Flovent but has been forced to use her rescue inhaler more often.  As she took her nebulization treatment while waiting for EMS, she felt she was not improving.  This caused panic and lightheadedness.  She reports passing out and the next thing she knew she was on an ambulance headed to the hospital.  She reportedly lost control of her bladder function but she reports she had to go to the restroom quite badly.  She reports that EMS would not allow her to go until she finished her breathing treatment.  She did not bite her tongue or lose bowel control.  She has no history of seizures.  She was afebrile.  She denies any illicit substance use or medication changes.  She is not an alcoholic.  She continues to have bilateral upper shoulder/neck pain.  Physical therapy over the past 6 weeks is somewhat helpful but she is still having weakness of the left upper extremity.  She has tried prednisone, ibuprofen, Tylenol, ice, and heat without relief.  We are awaiting an MRI.  Past Medical History:  Diagnosis Date  . Anal skin tag   . Headache   . History of hay fever   . History of migraine   . Moderate persistent asthma 12/20/2016    Objective: BP 136/86 (BP Location: Right Arm, Patient Position: Sitting, Cuff Size: Normal)   Pulse 92   Temp (!) 95.2 F (35.1 C) (Temporal)   Ht 5\' 5"  (1.651 m)   Wt 159 lb 8 oz (72.3 kg)   SpO2 95%   BMI 26.54 kg/m  General: Awake, appears stated age HEENT: MMM, EOMi Heart: RRR, no murmurs Lungs: CTAB, no rales, wheezes or rhonchi. No accessory muscle use Neuro: Gait is normal, no  cerebellar signs, DTRs equal and symmetric throughout, no clonus; 5/5 strength throughout the upper extremities Psych: Age appropriate judgment and insight, normal affect and mood  Assessment and Plan: Cervical spondylosis - Plan: MR Cervical Spine Wo Contrast  Weakness of left upper extremity - Plan: MR Cervical Spine Wo Contrast  Seizure-like activity (HCC)  Moderate persistent asthma without complication - Plan: azelastine (ASTELIN) 0.1 % nasal spray  1/2-I will try to reorder the MRI.  I try to have a peer to peer with someone from her insurance company but it was an absolute nightmare.  Our office later tried to update her case but the call was dropped after 90 minutes of waiting and being transferred to the wrong person.  If we are unable to reasonably improve the proposed imaging, I will refer her to physical medicine and rehabilitation. 3-she has an appointment with the neurology team in 4 days.  Recommend she keep this appointment and avoid driving until then.  I do not think this is a seizure and I do not believe the ER provider thought this was either but given the bladder incontinence, though it does have a reason, agree she should see a specialist. 4-we will add Astelin nasal spray to the Flonase, montelukast, and Zyrtec.  Hopefully if we can better control her allergy symptoms, it will not flare  her asthma.  We will follow-up in around 1 month to see how she is doing.  If no improvement, will add a long-acting beta agonist to her maintenance inhaler regimen. The patient voiced understanding and agreement to the plan.  [place], DO 11/25/19  4:23 PM

## 2019-11-25 NOTE — Discharge Instructions (Addendum)
You were evaluated in the Emergency Department and after careful evaluation, we did not find any emergent condition requiring admission or further testing in the hospital.  Your exam/testing today is overall reassuring.  As discussed, please follow-up with neurology regarding your seizure-like activity.  You should not drive a car or do other activities that would be dangerous if another episode occurred.  Please return to the Emergency Department if you experience any worsening of your condition.  We encourage you to follow up with a primary care provider.  Thank you for allowing [place] to be a part of your care.

## 2019-11-29 ENCOUNTER — Other Ambulatory Visit: Payer: Self-pay

## 2019-11-29 ENCOUNTER — Ambulatory Visit (INDEPENDENT_AMBULATORY_CARE_PROVIDER_SITE_OTHER): Payer: BC Managed Care – PPO | Admitting: Diagnostic Neuroimaging

## 2019-11-29 ENCOUNTER — Encounter: Payer: Self-pay | Admitting: Diagnostic Neuroimaging

## 2019-11-29 VITALS — BP 147/96 | HR 82 | Temp 97.9°F | Ht 65.0 in | Wt 162.8 lb

## 2019-11-29 DIAGNOSIS — R55 Syncope and collapse: Secondary | ICD-10-CM

## 2019-11-29 NOTE — Patient Instructions (Signed)
  PROVOKED SYNCOPE (during asthma / anxiety attack) - less likely to be seizure; however, will check MRI brain and EEG as precaution (due to incontinence, shaking, reported mild post-ictal confusion) - no driving for at least 1-3 months, until testing completed and asthma under control (could be shortened due to provoked nature of spell)

## 2019-11-29 NOTE — Progress Notes (Signed)
[facility]  PATIENT: Kimberly Ali DOB: 12-28-1975  REFERRING CLINICIAN: Shelda Pal* HISTORY FROM: patient  REASON FOR VISIT: new consult    HISTORICAL  CHIEF COMPLAINT:  Chief Complaint  Patient presents with  . Seizures    rm 7 New Pt  ED referral for seizure-like activity    HISTORY OF PRESENT ILLNESS:   44 year old female here for evaluation of syncope versus seizure.  11/25/2019 patient was at home and started to have shortness of breath from an asthma attack.  Her asthma has been flaring up in the past few weeks due to allergies.  Patient tried her albuterol treatments but felt that shortness of breath was rapidly progressing and therefore she called 911.  She has done this in the past for prior asthma attacks.  Upon arrival paramedics tried to give her a different breathing treatment.  Patient also had urged to go to the bathroom and asked to use the bathroom but paramedics told her she needed to continue her treatments.  Patient started to get very anxious. She knows she woke up in the ambulance.  Apparently she had passed out.  She had some shaking movements and urinary incontinence.  She also felt a little tired and groggy after waking up.  However she knew she was in the ambulance and on the way to the hospital.  Patient had CT and labs in the ER, was stabilized and sent home.  Since then no further events.  No prior similar events.   REVIEW OF SYSTEMS: Full 14 system review of systems performed and negative with exception of: As per HPI.  ALLERGIES: Allergies  Allergen Reactions  . Almond (Diagnostic) Itching and Swelling    HOME MEDICATIONS: Outpatient Medications Prior to Visit  Medication Sig Dispense Refill  . albuterol (VENTOLIN HFA) 108 (90 Base) MCG/ACT inhaler Inhale 1-2 puffs into the lungs every 6 (six) hours as needed for wheezing or shortness of breath. 18 g 1  . azelastine (ASTELIN) 0.1 % nasal spray Place 2  sprays into both nostrils 2 (two) times daily. Use in each nostril as directed 30 mL 12  . cetirizine (ZYRTEC) 10 MG tablet Take 10 mg by mouth daily as needed for allergies.  30 tablet 11  . fluticasone (FLOVENT HFA) 110 MCG/ACT inhaler Inhale 2 puffs into the lungs 2 (two) times daily. 1 Inhaler 5  . montelukast (SINGULAIR) 10 MG tablet Take 1 tablet (10 mg total) by mouth at bedtime. 30 tablet 5  . naproxen sodium (ALEVE) 220 MG tablet Take 440 mg by mouth as needed (pain).    Marland Kitchen zolmitriptan (ZOMIG) 5 MG tablet Take 1 tablet (5 mg total) by mouth as needed for migraine. (Patient not taking: Reported on 11/29/2019) 10 tablet 3  . predniSONE (DELTASONE) 10 MG tablet 6,5,4,3,2,1 taper 21 tablet 0  . propranolol ER (INDERAL LA) 60 MG 24 hr capsule Take 1 capsule (60 mg total) by mouth daily. (Patient not taking: Reported on 11/29/2019) 30 capsule 2   No facility-administered medications prior to visit.    PAST MEDICAL HISTORY: Past Medical History:  Diagnosis Date  . Anal skin tag   . Headache   . History of hay fever   . History of migraine   . Moderate persistent asthma 12/20/2016    PAST SURGICAL HISTORY: Past Surgical History:  Procedure Laterality Date  . ABDOMINAL HYSTERECTOMY     Fibroids  . CERVIX SURGERY     x2  . DILATION AND CURETTAGE  OF UTERUS    . EXCISION OF SKIN TAG N/A 07/23/2017   Procedure: EXCISION OF SKIN TAG;  Surgeon: Clovis Riley, MD;  Location: [place];  Service: General;  Laterality: N/A;    FAMILY HISTORY: Family History  Problem Relation Age of Onset  . Cancer Father        prostate  . Hypertension Father   . Diabetes Father   . Alcohol abuse Maternal Grandmother   . Alcohol abuse Maternal Grandfather     SOCIAL HISTORY: Social History   Socioeconomic History  . Marital status: Single    Spouse name: Not on file  . Number of children: 4  . Years of education: college  . Highest education level: Not on file  Occupational  History  . Not on file  Tobacco Use  . Smoking status: Former Research scientist (life sciences)  . Smokeless tobacco: Never Used  . Tobacco comment: > 16 yrs ago  Substance and Sexual Activity  . Alcohol use: Yes    Comment: social  . Drug use: No  . Sexual activity: Not on file  Other Topics Concern  . Not on file  Social History Narrative   Lives with family   Social Determinants of Health   Financial Resource Strain:   . Difficulty of Paying Living Expenses:   Food Insecurity:   . Worried About Charity fundraiser in the Last Year:   . Arboriculturist in the Last Year:   Transportation Needs:   . Film/video editor (Medical):   Marland Kitchen Lack of Transportation (Non-Medical):   Physical Activity:   . Days of Exercise per Week:   . Minutes of Exercise per Session:   Stress:   . Feeling of Stress :   Social Connections:   . Frequency of Communication with Friends and Family:   . Frequency of Social Gatherings with Friends and Family:   . Attends Religious Services:   . Active Member of Clubs or Organizations:   . Attends Archivist Meetings:   Marland Kitchen Marital Status:   Intimate Partner Violence:   . Fear of Current or Ex-Partner:   . Emotionally Abused:   Marland Kitchen Physically Abused:   . Sexually Abused:      PHYSICAL EXAM  GENERAL EXAM/CONSTITUTIONAL: Vitals:  Vitals:   11/29/19 0939  BP: (!) 147/96  Pulse: 82  Temp: 97.9 F (36.6 C)  Weight: 162 lb 12.8 oz (73.8 kg)  Height: 5\' 5"  (1.651 m)     Body mass index is 27.09 kg/m. Wt Readings from Last 3 Encounters:  11/29/19 162 lb 12.8 oz (73.8 kg)  11/25/19 159 lb 8 oz (72.3 kg)  11/25/19 157 lb (71.2 kg)     Patient is in no distress; well developed, nourished and groomed; neck is supple  CARDIOVASCULAR:  Examination of carotid arteries is normal; no carotid bruits  Regular rate and rhythm, no murmurs  Examination of peripheral vascular system by observation and palpation is normal  EYES:  Ophthalmoscopic exam of optic  discs and posterior segments is normal; no papilledema or hemorrhages  No exam data present  MUSCULOSKELETAL:  Gait, strength, tone, movements noted in Neurologic exam below  NEUROLOGIC: MENTAL STATUS:  No flowsheet data found.  awake, alert, oriented to person, place and time  recent and remote memory intact  normal attention and concentration  language fluent, comprehension intact, naming intact  fund of knowledge appropriate  CRANIAL NERVE:   2nd - no papilledema on fundoscopic  exam  2nd, 3rd, 4th, 6th - pupils equal and reactive to light, visual fields full to confrontation, extraocular muscles intact, no nystagmus  5th - facial sensation symmetric  7th - facial strength symmetric  8th - hearing intact  9th - palate elevates symmetrically, uvula midline  11th - shoulder shrug symmetric  12th - tongue protrusion midline  MOTOR:   normal bulk and tone, full strength in the BUE, BLE  SENSORY:   normal and symmetric to light touch, temperature, vibration  COORDINATION:   finger-nose-finger, fine finger movements normal  REFLEXES:   deep tendon reflexes present and symmetric  GAIT/STATION:   narrow based gait     DIAGNOSTIC DATA (LABS, IMAGING, TESTING) - I reviewed patient records, labs, notes, testing and imaging myself where available.  Lab Results  Component Value Date   WBC 13.8 (H) 11/25/2019   HGB 11.4 (L) 11/25/2019   HCT 37.1 11/25/2019   MCV 86.5 11/25/2019   PLT 304 11/25/2019      Component Value Date/Time   NA 137 11/25/2019 0732   K 4.0 11/25/2019 0732   CL 103 11/25/2019 0732   CO2 24 11/25/2019 0732   GLUCOSE 240 (H) 11/25/2019 0732   BUN 9 11/25/2019 0732   CREATININE 0.86 11/25/2019 0732   CALCIUM 8.7 (L) 11/25/2019 0732   PROT 6.6 11/25/2019 0732   ALBUMIN 3.9 11/25/2019 0732   AST 21 11/25/2019 0732   ALT 17 11/25/2019 0732   ALKPHOS 49 11/25/2019 0732   BILITOT 0.5 11/25/2019 0732   GFRNONAA >60 11/25/2019  0732   GFRAA >60 11/25/2019 0732   Lab Results  Component Value Date   CHOL 143 06/22/2018   HDL 53.00 06/22/2018   LDLCALC 76 06/22/2018   TRIG 70.0 06/22/2018   CHOLHDL 3 06/22/2018   Lab Results  Component Value Date   HGBA1C 6.0 12/20/2016   No results found for: [id number] Lab Results  Component Value Date   TSH 0.87 07/22/2018    11/25/19 CT head [I reviewed images myself and agree with interpretation. -VRP]  - negative    ASSESSMENT AND PLAN  44 y.o. year old female here with:  Dx:  1. Syncope, unspecified syncope type     PLAN:  PROVOKED SYNCOPE (during asthma / anxiety attack; brief convulsions, incontinence; mild post ictal grogginess) - less likely to be seizure; however, will check MRI brain and EEG as precaution (due to incontinence, shaking, reported mild post-ictal confusion) - no driving for at least 1-3 months, until testing completed and asthma under good control (could be shortened from 6 month waiting period, due to provoked nature of spell)  Orders Placed This Encounter  Procedures  . MR BRAIN W WO CONTRAST  . EEG adult   Return pending test results, for pending if symptoms worsen or fail to improve.  I reviewed images, labs, notes, records myself. I summarized findings and reviewed with patient, for this high risk condition (syncope) requiring high complexity decision making.    Penni Bombard, MD [id number], [id number] AM Certified in Neurology, Neurophysiology and Neuroimaging  [facility] [address] [place], [place] [address] [phone]

## 2019-12-01 ENCOUNTER — Telehealth: Payer: Self-pay | Admitting: Family Medicine

## 2019-12-01 DIAGNOSIS — J4540 Moderate persistent asthma, uncomplicated: Secondary | ICD-10-CM

## 2019-12-01 MED ORDER — MONTELUKAST SODIUM 10 MG PO TABS: 10.0000 mg | ORAL_TABLET | Freq: Every day | ORAL | 5 refills | Status: DC

## 2019-12-01 NOTE — Telephone Encounter (Signed)
Sent in

## 2019-12-01 NOTE — Telephone Encounter (Signed)
Medication: montelukast (SINGULAIR) 10 MG tablet  Has the patient contacted their pharmacy? Yes.   (If no, request that the patient contact the pharmacy for the refill.) (If yes, when and what did the pharmacy advise?) Call office they dont see meds there or her   Preferred Pharmacy (with phone number or street name): [id number] [place], [place] [place] [address]  Phone:  [phone] FaxPO:[phone]    Agent: Please be advised that RX refills may take up to 3 business days. We ask that you follow-up with your pharmacy.

## 2019-12-02 ENCOUNTER — Telehealth: Payer: Self-pay | Admitting: Diagnostic Neuroimaging

## 2019-12-02 NOTE — Telephone Encounter (Signed)
BCBS auth: NPR Ref # Kimberly Ali on 07/25/20 on 12/02/19/medicaid order sent to [facility]. They will obtain the auth for medicaid and reach out to the patient to schedule.

## 2019-12-05 ENCOUNTER — Ambulatory Visit (HOSPITAL_BASED_OUTPATIENT_CLINIC_OR_DEPARTMENT_OTHER)
Admission: RE | Admit: 2019-12-05 | Discharge: 2019-12-05 | Disposition: A | Discharge status: Home / Self Care | Payer: BC Managed Care – PPO | Source: Ambulatory Visit | Attending: Family Medicine | Admitting: Family Medicine

## 2019-12-05 ENCOUNTER — Other Ambulatory Visit: Payer: Self-pay

## 2019-12-05 DIAGNOSIS — R29898 Other symptoms and signs involving the musculoskeletal system: Secondary | ICD-10-CM

## 2019-12-05 DIAGNOSIS — M542 Cervicalgia: Secondary | ICD-10-CM | POA: Diagnosis not present

## 2019-12-05 DIAGNOSIS — M47812 Spondylosis without myelopathy or radiculopathy, cervical region: Secondary | ICD-10-CM | POA: Insufficient documentation

## 2019-12-16 ENCOUNTER — Ambulatory Visit (INDEPENDENT_AMBULATORY_CARE_PROVIDER_SITE_OTHER): Payer: BC Managed Care – PPO

## 2019-12-16 ENCOUNTER — Other Ambulatory Visit: Payer: Self-pay

## 2019-12-16 DIAGNOSIS — R55 Syncope and collapse: Secondary | ICD-10-CM | POA: Diagnosis not present

## 2019-12-24 NOTE — Procedures (Signed)
   [facility]  EEG (ELECTROENCEPHALOGRAM) REPORT   STUDY DATE: 12/16/19 PATIENT NAME: Kimberly Ali DOB: [birth year] MRN: [id number]  ORDERING CLINICIAN: Andrey Spearman, MD   TECHNOLOGIST: Babs Bertin  TECHNIQUE: Electroencephalogram was recorded utilizing standard 10-20 system of lead placement and reformatted into average and bipolar montages.  RECORDING TIME: 29 minutes ACTIVATION: hyperventilation and photic stimulation  CLINICAL INFORMATION: 44 year old female with syncope vs seizure.  FINDINGS: Posterior dominant background rhythms, which attenuate with eye opening, ranging 9-10 hertz and 20-30 microvolts. No focal, lateralizing, epileptiform activity or seizures are seen. Patient recorded in the awake and drowsy state. EKG channel shows regular rhythm of 60-70 beats per minute.   IMPRESSION:   Normal EEG in the awake and drowsy states.    INTERPRETING PHYSICIAN:  Penni Bombard, MD Certified in Neurology, Neurophysiology and Neuroimaging  [facility] [address], [place], [place] [address] [phone]

## 2019-12-28 ENCOUNTER — Telehealth: Payer: Self-pay | Admitting: *Deleted

## 2019-12-28 ENCOUNTER — Other Ambulatory Visit: Payer: Self-pay

## 2019-12-28 ENCOUNTER — Encounter: Payer: Self-pay | Admitting: Family Medicine

## 2019-12-28 ENCOUNTER — Ambulatory Visit (INDEPENDENT_AMBULATORY_CARE_PROVIDER_SITE_OTHER): Payer: BC Managed Care – PPO | Admitting: Family Medicine

## 2019-12-28 VITALS — BP 120/80 | HR 102 | Temp 96.7°F | Ht 65.0 in | Wt 156.5 lb

## 2019-12-28 DIAGNOSIS — J4540 Moderate persistent asthma, uncomplicated: Secondary | ICD-10-CM

## 2019-12-28 MED ORDER — SPACER/AERO-HOLD CHAMBER MASK MISC: 0 refills | Status: DC

## 2019-12-28 MED ORDER — FLUTICASONE FUROATE-VILANTEROL 200-25 MCG/INH IN AEPB: 1.0000 | INHALATION_SPRAY | Freq: Every day | RESPIRATORY_TRACT | 2 refills | Status: DC

## 2019-12-28 NOTE — Patient Instructions (Signed)
Rinse mouth out after using Breo. This is replacing your Flovent. Keep the Flovent around though.  Continue your allergy regimen.  Let us know if you need anything.

## 2019-12-28 NOTE — Addendum Note (Signed)
Addended by: Sharon Seller B on: 12/28/2019 05:02 PM   Modules accepted: Orders

## 2019-12-28 NOTE — Progress Notes (Signed)
Chief Complaint  Patient presents with  . Follow-up    Subjective: Patient is a 44 y.o. female here for f/u asthma.  On 4/1, Astelin nasal spray was added to Flonase, Singulair, and Zyrtec.  The hope was to improve allergy symptoms so her asthma would not continue to flare during pollen season.  She is compliant with her inhaled corticosteroid and rinses her mouth out after each use.  Currently, she is using her rescue inhaler daily.  She is not having any wheezing, cough, but she is still having some shortness of breath, mainly at night.  Past Medical History:  Diagnosis Date  . Anal skin tag   . Headache   . History of hay fever   . History of migraine   . Moderate persistent asthma 12/20/2016    Objective: BP 120/80 (BP Location: Left Arm, Patient Position: Sitting, Cuff Size: Normal)   Pulse (!) 102   Temp (!) 96.7 F (35.9 C) (Temporal)   Ht 5\' 5"  (1.651 m)   Wt 156 lb 8 oz (71 kg)   SpO2 97%   BMI 26.04 kg/m  General: Awake, appears stated age HEENT: MMM, EOMi, canals patent, TMs negative, nares are patent without discharge Heart: RRR, no lower extremity edema Lungs: CTAB, no rales, wheezes or rhonchi. No accessory muscle use Psych: Age appropriate judgment and insight, normal affect and mood  Assessment and Plan: Moderate persistent asthma without complication - Plan: fluticasone furoate-vilanterol (BREO ELLIPTA) 200-25 MCG/INH AEPB  1-chronic, not controlled; add Breo, stop Flovent; continue [place] as needed, Astelin, Flonase, Singulair, oral antihistamine.  I will see her in 1 month.  If no improvement or failure to control at goal, we will add a LAMA and refer to allergy/asthma specialist. The patient voiced understanding and agreement to the plan.  [place], DO 12/28/19  4:24 PM

## 2019-12-28 NOTE — Telephone Encounter (Signed)
Spoke with patient and informed her the EEG was normal. Dr Leta Baptist will wait for MRI brain results from scan tomorrow before determining her driving restrictions. Patient verbalized understanding, appreciation.

## 2019-12-29 ENCOUNTER — Ambulatory Visit
Admission: RE | Admit: 2019-12-29 | Discharge: 2019-12-29 | Disposition: A | Discharge status: Home / Self Care | Payer: BC Managed Care – PPO | Source: Ambulatory Visit | Attending: Diagnostic Neuroimaging | Admitting: Diagnostic Neuroimaging

## 2019-12-29 DIAGNOSIS — R55 Syncope and collapse: Secondary | ICD-10-CM

## 2019-12-29 MED ORDER — GADOBENATE DIMEGLUMINE 529 MG/ML IV SOLN
15.0000 mL | Freq: Once | INTRAVENOUS | Status: AC | PRN
  Administered 2019-12-29: 15 mL via INTRAVENOUS

## 2020-01-07 ENCOUNTER — Telehealth: Payer: Self-pay | Admitting: Family Medicine

## 2020-01-07 NOTE — Telephone Encounter (Signed)
Form has been placed on Kimberly Ali's desk.

## 2020-01-07 NOTE — Telephone Encounter (Signed)
Patient dropped off medical leave paper work for Dr. Nani Ravens to be filled out . Patient would like forms to be faxed to [phone] please advise / left form in basket .

## 2020-01-10 NOTE — Telephone Encounter (Signed)
Form in PCP's folder

## 2020-01-18 ENCOUNTER — Encounter: Payer: Self-pay | Admitting: *Deleted

## 2020-01-19 NOTE — Telephone Encounter (Signed)
Paperwork completed by PCP/faxed to number below. Called the patient informed form has been faxed/left detailed message

## 2020-01-25 ENCOUNTER — Other Ambulatory Visit: Payer: BC Managed Care – PPO

## 2020-01-25 ENCOUNTER — Ambulatory Visit
Admission: RE | Admit: 2020-01-25 | Discharge: 2020-01-25 | Disposition: A | Discharge status: Home / Self Care | Payer: BC Managed Care – PPO | Source: Ambulatory Visit | Attending: Family Medicine | Admitting: Family Medicine

## 2020-01-25 ENCOUNTER — Other Ambulatory Visit: Payer: Self-pay

## 2020-01-25 DIAGNOSIS — N6310 Unspecified lump in the right breast, unspecified quadrant: Secondary | ICD-10-CM

## 2020-01-25 DIAGNOSIS — R922 Inconclusive mammogram: Secondary | ICD-10-CM | POA: Diagnosis not present

## 2020-01-26 ENCOUNTER — Telehealth: Payer: Self-pay | Admitting: Family Medicine

## 2020-01-26 NOTE — Telephone Encounter (Signed)
Caller: Luda Call back phone number: [phone]  Patient states she would like a call back in regards to [facility] paperwork. Patient states dates are incorrect.  Please advise.

## 2020-01-26 NOTE — Telephone Encounter (Signed)
Called her back and there seems to be some confusion as to her last day of work, and return to work date.  She will bring in the paperwork on Friday 01/28/20 to complete/correct with PCP.

## 2020-01-28 ENCOUNTER — Ambulatory Visit (INDEPENDENT_AMBULATORY_CARE_PROVIDER_SITE_OTHER): Payer: BC Managed Care – PPO | Admitting: Family Medicine

## 2020-01-28 ENCOUNTER — Other Ambulatory Visit: Payer: Self-pay

## 2020-01-28 ENCOUNTER — Encounter: Payer: Self-pay | Admitting: Family Medicine

## 2020-01-28 VITALS — BP 130/80 | HR 66 | Temp 96.9°F | Ht 65.0 in | Wt 160.2 lb

## 2020-01-28 DIAGNOSIS — J302 Other seasonal allergic rhinitis: Secondary | ICD-10-CM

## 2020-01-28 DIAGNOSIS — J4540 Moderate persistent asthma, uncomplicated: Secondary | ICD-10-CM | POA: Diagnosis not present

## 2020-01-28 DIAGNOSIS — B373 Candidiasis of vulva and vagina: Secondary | ICD-10-CM

## 2020-01-28 DIAGNOSIS — B3731 Acute candidiasis of vulva and vagina: Secondary | ICD-10-CM

## 2020-01-28 MED ORDER — LEVOCETIRIZINE DIHYDROCHLORIDE 5 MG PO TABS: 5.0000 mg | ORAL_TABLET | Freq: Every evening | ORAL | 2 refills | Status: DC

## 2020-01-28 MED ORDER — FLUCONAZOLE 150 MG PO TABS: ORAL_TABLET | ORAL | 0 refills | Status: DC

## 2020-01-28 NOTE — Progress Notes (Signed)
Chief Complaint  Patient presents with  . Follow-up    paperwork    Subjective: Patient is a 44 y.o. female here for f/u.  Patient continues to have nasal congestion associated with allergies despite taking Zyrtec, Singulair, Astelin nasal spray, and Flonase.  She reports compliance with his medications.  She has been on Zyrtec for many years.  She was prescribed Xyzal but was afraid to take it.  She has never seen an allergist.  Pollen surge is one of her major triggers for both allergies and asthma.  The patient has a history of asthma for which she takes Breo at the max dose in addition to the above medications for her allergies.  She does use albuterol quite frequently, albeit less so since starting Breo.  She has rinse her mouth out after using it.  She will have intermittent coughing episodes and shortness of breath.  She does not currently have significant wheezing.  Over the past week, the patient has had vaginal itching.  There is no bleeding, pain, discharge, or urinary complaints.  No fevers or abdominal pain either.  She has had yeast infections in the past and this feels very similar.  She has not tried anything so far.  Past Medical History:  Diagnosis Date  . Anal skin tag   . Headache   . History of hay fever   . History of migraine   . Moderate persistent asthma 12/20/2016    Objective: BP 130/80 (BP Location: Left Arm, Patient Position: Sitting, Cuff Size: Normal)   Pulse 66   Temp (!) 96.9 F (36.1 C) (Temporal)   Ht 5\' 5"  (1.651 m)   Wt 160 lb 4 oz (72.7 kg)   SpO2 98%   BMI 26.67 kg/m  General: Awake, appears stated age Heart: RRR Lungs: CTAB, no rales, wheezes or rhonchi. No accessory muscle use Psych: Age appropriate judgment and insight, normal affect and mood  Assessment and Plan: Moderate persistent asthma without complication - Plan: Ambulatory referral to Allergy  Seasonal allergies - Plan: levocetirizine (XYZAL) 5 MG tablet  Yeast vaginitis -  Plan: fluconazole (DIFLUCAN) 150 MG tablet  1-offered to add a long-acting muscarinic agonist/new inhaler which he declined.  Will refer to the allergy team. 2-change Zyrtec to Xyzal.  Continue intranasal antihistamine, intranasal steroid, and Singulair otherwise. 3-Diflucan. Follow-up for her annual visit in 4 months. The patient voiced understanding and agreement to the plan.  [place], DO 01/28/20  10:46 AM

## 2020-01-28 NOTE — Patient Instructions (Addendum)
If you do not hear anything about your referral in the next 1-2 weeks, call our office and ask for an update.  Claritin (loratadine), Allegra (fexofenadine), Zyrtec (cetirizine) which is also equivalent to Xyzal (levocetirizine); these are listed in order from weakest to strongest. Generic, and therefore cheaper, options are in the parentheses.   There are available OTC, and the generic versions, which may be cheaper, are in parentheses. Show this to a pharmacist if you have trouble finding any of these items.  Let us know if you need anything.

## 2020-02-16 NOTE — Progress Notes (Signed)
New Patient Note  RE: Kimberly Ali MRN: [id number] DOB: 1976/03/06 Date of Office Visit: 02/17/2020  Referring provider: Shelda Pal* Primary care provider: Shelda Pal, DO  Chief Complaint: Asthma (asthma attach 11/25/2019 which caused a siezer)  History of Present Illness: I had the pleasure of seeing Kimberly Ali for initial evaluation at the [facility] on 02/17/2020. She is a 44 y.o. female, who is referred here by Shelda Pal, DO for the evaluation of asthma.  Asthma: She reports symptoms of chest tightness, shortness of breath, coughing, wheezing, nocturnal awakenings for 30+ years. Current medications include Breo 200 1 puff once a day x 1 month and albuterol prn which help. She reports not using aerochamber with inhalers. She tried the following inhalers: Flovent. Main triggers are infections and allergies. In the last month, frequency of symptoms: 1-2x/week. Frequency of nocturnal symptoms: 0x/month. Frequency of SABA use: 1-2x/week. Interference with physical activity: sometimes. Sleep is undisturbed. In the last 12 months, emergency room visits/urgent care visits/doctor office visits or hospitalizations due to respiratory issues: 6. In the last 12 months, oral steroids courses: 2 with good benefit. Lifetime history of hospitalization for respiratory issues: yes in 2015 and in the ICU. Prior intubations: no. Asthma was diagnosed at age 52. History of pneumonia: yes. She was evaluated by pulmonologist in the past. Smoking exposure: 16 years ago quit. Up to date with flu vaccine: no. Up to date with COVID-19 vaccine: no.  History of reflux: no. Patient moved to [place] in 2017 from [place] and asthma started to flare again.   On 11/25/2019 patient had loss of consciousness while receiving a treatment by EMS and had loss of bladder control but she had to use the bathroom prior to starting the treatment.   Rhinitis:  She reports  symptoms of itchy eyes, rhinorrhea, itchy throat and ear, nasal congestion, sneezing. Symptoms have been going on for 3-4 years. The symptoms are present all year around with worsening in spring and fall. Other triggers include exposure to dog. Anosmia: no. Headache: yes. She has used zyrtec, Allegra, Singulair, Flonase, azelastine, Xyzal with some improvement in symptoms. Sinus infections: none in the past year. Previous work up includes: none. Previous ENT evaluation: denies. Previous sinus imaging: no. History of nasal polyps: yes as a child?. Last eye exam: yes recently.  11/25/2019 ER HPI: "2 or 3 days of worsening shortness of breath, out of inhaler at home.  Chest tightness.  Was feeling very short of breath and then had a episode of loss of consciousness, witnessed shaking behavior and incontinence, was reportedly somnolent 10 minutes after the shaking behavior.  Denies any history of seizures, drug or alcohol use, no numbness or weakness to the arms or legs."  Assessment and Plan: Kimberly Ali is a 44 y.o. female with: Severe persistent asthma without complication Diagnosed with asthma over 30 years ago.  Recently had an ER visit on April 1st after losing consciousness while receiving a nebulizer treatment.  Main triggers include infections and allergies.  Started on Breo 201 puff once a day for the last month and noticing improvement.  Still using albuterol a few times a week as needed with good benefit.  Patient was hospitalized in 2015 in the ICU for asthma exacerbation.  No previous asthma Biologics.  Not up-to-date with COVID-19 vaccine.  Today's spirometry showed some mild obstruction however patient just used her inhaler prior to the appointment.  No improvement in FEV1 post bronchodilator treatment. Marland Kitchen  Get bloodwork to see if she meets criteria for biologics treatment for asthma.  . Daily controller medication(s): continue Breo 276mcg 1 puff once a day and rinse mouth after each  use. o Continue with Singulair 10mg  daily. . Prior to physical activity: May use albuterol rescue inhaler 2 puffs 5 to 15 minutes prior to strenuous physical activities. Marland Kitchen Rescue medications: May use albuterol rescue inhaler 2 puffs or nebulizer every 4 to 6 hours as needed for shortness of breath, chest tightness, coughing, and wheezing. Monitor frequency of use.  . During upper respiratory infections/asthma flares: Start Flovent 2 puffs twice a day with spacer and rinse mouth afterwards for 1-2 weeks.  . Repeat spirometry at next visit.  Other allergic rhinitis Perennial rhinoconjunctivitis symptoms in the last 4 years with worsening in the spring and fall.  Tried Zyrtec, Allegra, Singulair, Flonase, azelastine and Xyzal with some benefit.  No previous allergy evaluation.  Questionable nasal polyps as a child.  Today's skin testing showed: Positive to grass, ragweed, weed, trees, dust mites, cat, dog, horse, mold, cockroach.  Start environmental control measures as below.  May use over the counter antihistamines such as Zyrtec (cetirizine), Claritin (loratadine), Allegra (fexofenadine), or Xyzal (levocetirizine) daily as needed. May take twice a day if needed.  Start dymista (fluticasone + azelastine nasal spray combination) 1 spray per nostril twice a day.  This replaces all of your other nasal sprays for now. If it's not covered let us know.   Nasal saline spray (i.e., Simply Saline) or nasal saline lavage (i.e., NeilMed) is recommended as needed and prior to medicated nasal sprays.  May use olopatadine eye drops 0.2% once a day as needed for itchy/watery eyes.  Allergic conjunctivitis of both eyes  See assessment and plan as above for allergic rhinitis.  Adverse food reaction Ambien causes perioral pruritus and hives.  Tolerates peanuts cashews and pecans with no issues.  No previous allergy testing.  Today's skin testing was negative to foods including almonds.  Continue to  avoid almonds for now.  For mild symptoms you can take over the counter antihistamines such as Benadryl and monitor symptoms closely. If symptoms worsen or if you have severe symptoms including breathing issues, throat closure, significant swelling, whole body hives, severe diarrhea and vomiting, lightheadedness then inject epinephrine and seek immediate medical care afterwards.  Food action plan in place.   Other atopic dermatitis Breaking out on her arms, neck and under her armpits.  Using Vaseline and some type of steroid cream prescribed by her dermatologist with some benefit.  No specific triggers identified.  See below for proper skin care.  Recommend patch testing.   Patches are best placed on Monday with return to office on Wednesday and Friday of same week for readings.  Patches once placed should not get wet.  You do not have to stop any medications for patch testing but should not be on oral prednisone. You can schedule a patch testing visit when convenient for your schedule.  This is done in our [place] office.   Return in about 4 weeks (around 03/16/2020).  Meds ordered this encounter  Medications  . Azelastine-Fluticasone 137-50 MCG/ACT SUSP    Sig: Place 1 spray into the nose in the morning and at bedtime.    Dispense:  23 g    Refill:  5  . Olopatadine HCl 0.2 % SOLN    Sig: Apply 1 drop to eye daily as needed (itchy/watery eyes).    Dispense:  2.5 mL  Refill:  5    Lab Orders     CBC with Differential/Platelet     IgE     IgE Nut Prof. w/Component Rflx  Other allergy screening: Food allergy: yes  Almond causes perioral pruritus and facial hives.  Dietary History: patient has been eating other foods including milk, eggs, peanut, cashews, pecans, sesame, shellfish, seafood, soy, wheat, meats, fruits and vegetables.  She reports reading labels and avoiding almonds in diet completely.  Medication allergy: no Hymenoptera allergy: no Urticaria: no Eczema:  yes Mainly breaks out in her arms, neck, under armpits. Currently using Vaseline and uses some type of steroid cream with good benefit.   History of recurrent infections suggestive of immunodeficency: no  Diagnostics: Spirometry:  Tracings reviewed. Her effort: Good reproducible efforts. FVC: 3.07L FEV1: 2.02L, 76% predicted FEV1/FVC ratio: 66% Interpretation: Spirometry consistent with mild obstructive disease with no improvement in FEV1 post bronchodilator treatment. Patient did use her own albuterol inhaler 2 puffs prior to doing the pre spirometry.  Please see scanned spirometry results for details.  Skin Testing: Environmental allergy panel and select foods. Positive to grass, ragweed, weed, trees, dust mites, cat, dog, horse, mold, cockroach. Negative to foods. Results discussed with patient/family.  Airborne Adult Perc - 02/17/20 1450    Time Antigen Placed 1451    Allergen Manufacturer Lavella Hammock    Location Back    Number of Test 59    Panel 1 Select    1. Control-Buffer 50% Glycerol Negative    2. Control-Histamine 1 mg/ml 2+    3. Albumin saline Negative    4. Stem 2+    5. [place] Negative   +/-   6. Johnson Negative    7. [place] 4+    8. Meadow Fescue 3+    9. Perennial Rye Negative    10. Sweet Vernal 2+    11. Timothy 3+    12. Cocklebur Negative    13. Burweed Marshelder Negative    14. Ragweed, short 3+    15. Ragweed, Giant Negative    16. Plantain,  English Negative    17. Lamb's Quarters Negative    18. Sheep Sorrell 2+    19. Rough Pigweed Negative    20. Marsh Elder, Rough 2+    21. Mugwort, Common --   +/-   22. Ash mix Negative    23. Birch mix 4+    24. Beech American 3+    25. Box, Elder Negative    26. Cedar, red Negative    27. Cottonwood, [place] Negative    28. Elm mix --   +/-   29. Hickory 2+    30. Maple mix Negative    31. Oak, [place] mix 3+    32. Pecan Pollen 2+    33. Pine mix Negative    34. Sycamore Eastern Negative     35. Cromwell, Black Pollen Negative    36. Alternaria alternata Negative    37. Cladosporium Herbarum Negative    38. Aspergillus mix Negative    39. Penicillium mix Negative    40. Bipolaris sorokiniana (Helminthosporium) Negative    41. Drechslera spicifera (Curvularia) Negative    42. Mucor plumbeus Negative    43. Fusarium moniliforme Negative    44. Aureobasidium pullulans (pullulara) Negative    45. Rhizopus oryzae Negative    46. Botrytis cinera Negative    47. Epicoccum nigrum Negative    48. Phoma betae Negative    49. Candida Albicans  Negative    50. Trichophyton mentagrophytes Negative    51. Mite, D Farinae  5,000 AU/ml 4+    52. Mite, D Pteronyssinus  5,000 AU/ml 4+    53. Cat Hair 10,000 BAU/ml 2+    54.  Dog Epithelia 2+    55. Mixed Feathers Negative    56. Horse Epithelia 2+    57. Cockroach, German Negative    58. Mouse Negative    59. Tobacco Leaf Negative          Intradermal - 02/17/20 1617    Time Antigen Placed 0340    Allergen Manufacturer Lavella Hammock    Location Arm    Number of Test 7    Intradermal Select    Control Negative    Johnson 2+    Mold 1 Negative    Mold 2 Negative    Mold 3 Negative    Mold 4 Negative    Cockroach 2+          Food Adult Perc - 02/17/20 1400    Time Antigen Placed 1451    Allergen Manufacturer Lavella Hammock    Location Back    Number of allergen test 17    1. Peanut Negative    2. Soybean Negative    3. Wheat Negative    4. Sesame Negative    5. Milk, cow Negative    6. Egg White, Chicken Negative    7. Casein Negative    8. Shellfish Mix Negative    9. Fish Mix Negative    10. Cashew Negative    11. Pecan Food Negative    12. Burdette Negative    13. Almond Negative    14. Hazelnut Negative    15. [place] nut Negative    16. Coconut Negative    17. Pistachio Negative           Past Medical History: Patient Active Problem List   Diagnosis Date Noted  . Severe persistent asthma without complication  06/19/8526  . Other allergic rhinitis 02/17/2020  . Allergic conjunctivitis of both eyes 02/17/2020  . Adverse food reaction 02/17/2020  . Other atopic dermatitis 02/17/2020  . Cervical spondylosis 09/03/2019  . 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 Moderate persistent asthma 12/20/2016   Past Medical History:  Diagnosis Date  . Anal skin tag   . Eczema   . Headache   . History of hay fever   . History of migraine   . Moderate persistent asthma 12/20/2016  . Urticaria    Past Surgical History: Past Surgical History:  Procedure Laterality Date  . ABDOMINAL HYSTERECTOMY     Fibroids  . CERVIX SURGERY     x2  . DILATION AND CURETTAGE OF UTERUS    . EXCISION OF SKIN TAG N/A 07/23/2017   Procedure: EXCISION OF SKIN TAG;  Surgeon: Clovis Riley, MD;  Location: [facility];  Service: General;  Laterality: N/A;   Medication List:  Current Outpatient Medications  Medication Sig Dispense Refill  . albuterol (VENTOLIN HFA) 108 (90 Base) MCG/ACT inhaler Inhale 1-2 puffs into the lungs every 6 (six) hours as needed for wheezing or shortness of breath. 18 g 1  . azelastine (ASTELIN) 0.1 % nasal spray Place 2 sprays into both nostrils 2 (two) times daily. Use in each nostril as directed 30 mL 12  . fluticasone furoate-vilanterol (BREO ELLIPTA) 200-25 MCG/INH AEPB Inhale 1 puff into the lungs daily. 30 each 2  . montelukast (SINGULAIR) 10 MG  tablet Take 1 tablet (10 mg total) by mouth at bedtime. 30 tablet 5  . naproxen sodium (ALEVE) 220 MG tablet Take 440 mg by mouth as needed (pain).    . Spacer/Aero-Hold Chamber Mask MISC Use with inhaler 1 each 0  . Azelastine-Fluticasone 137-50 MCG/ACT SUSP Place 1 spray into the nose in the morning and at bedtime. 23 g 5  . Olopatadine HCl 0.2 % SOLN Apply 1 drop to eye daily as needed (itchy/watery eyes). 2.5 mL 5   No current facility-administered medications for this visit.   Allergies: Allergies  Allergen Reactions  . Almond  (Diagnostic) Itching and Swelling   Social History: Social History   Socioeconomic History  . Marital status: Single    Spouse name: Not on file  . Number of children: 4  . Years of education: college  . Highest education level: Not on file  Occupational History  . Not on file  Tobacco Use  . Smoking status: Former Research scientist (life sciences)  . Smokeless tobacco: Never Used  . Tobacco comment: > 16 yrs ago  Vaping Use  . Vaping Use: Never used  Substance and Sexual Activity  . Alcohol use: Yes    Comment: social  . Drug use: No  . Sexual activity: Not on file  Other Topics Concern  . Not on file  Social History Narrative   Lives with family   Social Determinants of Health   Financial Resource Strain:   . Difficulty of Paying Living Expenses:   Food Insecurity:   . Worried About Charity fundraiser in the Last Year:   . Arboriculturist in the Last Year:   Transportation Needs:   . Film/video editor (Medical):   Marland Kitchen Lack of Transportation (Non-Medical):   Physical Activity:   . Days of Exercise per Week:   . Minutes of Exercise per Session:   Stress:   . Feeling of Stress :   Social Connections:   . Frequency of Communication with Friends and Family:   . Frequency of Social Gatherings with Friends and Family:   . Attends Religious Services:   . Active Member of Clubs or Organizations:   . Attends Archivist Meetings:   Marland Kitchen Marital Status:    Lives in an apartment since 2017. Smoking: 16 years ago Occupation: Training and development officer History: Environmental education officer in the house: no Charity fundraiser in the family room: no Carpet in the bedroom: no Heating: electric Cooling: central Pet: yes 1 dog x 10 yrs  Family History: Family History  Problem Relation Age of Onset  . Cancer Father        prostate  . Hypertension Father   . Diabetes Father   . Allergic rhinitis Father   . Alcohol abuse Maternal Grandmother   . Alcohol abuse Maternal Grandfather   . Asthma Neg Hx    . Urticaria Neg Hx   . Eczema Neg Hx    Review of Systems  Constitutional: Negative for appetite change, chills, fever and unexpected weight change.  HENT: Positive for congestion, rhinorrhea and sneezing.   Eyes: Positive for itching.  Respiratory: Positive for cough, chest tightness, shortness of breath and wheezing.   Cardiovascular: Negative for chest pain.  Gastrointestinal: Negative for abdominal pain.  Genitourinary: Negative for difficulty urinating.  Skin: Positive for rash.  Allergic/Immunologic: Positive for environmental allergies.  Neurological: Negative for headaches.   Objective: BP (!) 148/88 (BP Location: Left Arm, Patient Position: Sitting, Cuff Size: Normal)  Pulse 87   Temp 97.6 F (36.4 C) (Temporal)   Resp 18   Ht 5' 5.35" (1.66 m)   Wt 157 lb (71.2 kg)   SpO2 99%   BMI 25.84 kg/m  Body mass index is 25.84 kg/m. Physical Exam Vitals and nursing note reviewed.  Constitutional:      Appearance: Normal appearance. She is well-developed.  HENT:     Head: Normocephalic and atraumatic.     Right Ear: External ear normal.     Left Ear: External ear normal.     Nose: Nose normal.     Mouth/Throat:     Mouth: Mucous membranes are moist.     Pharynx: Oropharynx is clear.  Eyes:     Conjunctiva/sclera: Conjunctivae normal.  Cardiovascular:     Rate and Rhythm: Normal rate and regular rhythm.     Heart sounds: Normal heart sounds. No murmur heard.  No friction rub. No gallop.   Pulmonary:     Effort: Pulmonary effort is normal.     Breath sounds: Normal breath sounds. No wheezing, rhonchi or rales.  Abdominal:     Palpations: Abdomen is soft.  Musculoskeletal:     Cervical back: Neck supple.  Skin:    General: Skin is warm.     Findings: No rash.  Neurological:     Mental Status: She is alert and oriented to person, place, and time.  Psychiatric:        Behavior: Behavior normal.    The plan was reviewed with the patient/family, and all  questions/concerned were addressed.  It was my pleasure to see Kimberly Ali today and participate in her care. Please feel free to contact me with any questions or concerns.  Sincerely,  Rexene Alberts, DO Allergy & Immunology  [facility] office: [phone] [facility] office: [place] office: [phone]

## 2020-02-17 ENCOUNTER — Encounter: Payer: Self-pay | Admitting: Allergy

## 2020-02-17 ENCOUNTER — Other Ambulatory Visit: Payer: Self-pay

## 2020-02-17 ENCOUNTER — Ambulatory Visit: Payer: BC Managed Care – PPO | Admitting: Allergy

## 2020-02-17 VITALS — BP 148/88 | HR 87 | Temp 97.6°F | Resp 18 | Ht 65.35 in | Wt 157.0 lb

## 2020-02-17 DIAGNOSIS — T781XXD Other adverse food reactions, not elsewhere classified, subsequent encounter: Secondary | ICD-10-CM | POA: Diagnosis not present

## 2020-02-17 DIAGNOSIS — J4550 Severe persistent asthma, uncomplicated: Secondary | ICD-10-CM

## 2020-02-17 DIAGNOSIS — H1013 Acute atopic conjunctivitis, bilateral: Secondary | ICD-10-CM | POA: Diagnosis not present

## 2020-02-17 DIAGNOSIS — L2089 Other atopic dermatitis: Secondary | ICD-10-CM

## 2020-02-17 DIAGNOSIS — T781XXA Other adverse food reactions, not elsewhere classified, initial encounter: Secondary | ICD-10-CM | POA: Insufficient documentation

## 2020-02-17 DIAGNOSIS — J3089 Other allergic rhinitis: Secondary | ICD-10-CM | POA: Diagnosis not present

## 2020-02-17 MED ORDER — OLOPATADINE HCL 0.2 % OP SOLN
1.0000 [drp] | Freq: Every day | OPHTHALMIC | 5 refills | Status: DC | PRN
Start: 2020-02-17 — End: 2022-02-08

## 2020-02-17 MED ORDER — AZELASTINE-FLUTICASONE 137-50 MCG/ACT NA SUSP: 1.0000 | Freq: Two times a day (BID) | NASAL | 5 refills | Status: DC

## 2020-02-17 NOTE — Assessment & Plan Note (Signed)
Ambien causes perioral pruritus and hives.  Tolerates peanuts cashews and pecans with no issues.  No previous allergy testing.  Today's skin testing was negative to foods including almonds.  Continue to avoid almonds for now.  For mild symptoms you can take over the counter antihistamines such as Benadryl and monitor symptoms closely. If symptoms worsen or if you have severe symptoms including breathing issues, throat closure, significant swelling, whole body hives, severe diarrhea and vomiting, lightheadedness then inject epinephrine and seek immediate medical care afterwards.  Food action plan in place.

## 2020-02-17 NOTE — Assessment & Plan Note (Signed)
Diagnosed with asthma over 30 years ago.  Recently had an ER visit on April 1st after losing consciousness while receiving a nebulizer treatment.  Main triggers include infections and allergies.  Started on Breo 201 puff once a day for the last month and noticing improvement.  Still using albuterol a few times a week as needed with good benefit.  Patient was hospitalized in 2015 in the ICU for asthma exacerbation.  No previous asthma Biologics.  Not up-to-date with COVID-19 vaccine.  Today's spirometry showed some mild obstruction however patient just used her inhaler prior to the appointment.  No improvement in FEV1 post bronchodilator treatment. . Get bloodwork to see if she meets criteria for biologics treatment for asthma.  . Daily controller medication(s): continue Breo 225mcg 1 puff once a day and rinse mouth after each use. o Continue with Singulair 10mg  daily. . Prior to physical activity: May use albuterol rescue inhaler 2 puffs 5 to 15 minutes prior to strenuous physical activities. Marland Kitchen Rescue medications: May use albuterol rescue inhaler 2 puffs or nebulizer every 4 to 6 hours as needed for shortness of breath, chest tightness, coughing, and wheezing. Monitor frequency of use.  . During upper respiratory infections/asthma flares: Start Flovent 2 puffs twice a day with spacer and rinse mouth afterwards for 1-2 weeks.  . Repeat spirometry at next visit.

## 2020-02-17 NOTE — Assessment & Plan Note (Signed)
Perennial rhinoconjunctivitis symptoms in the last 4 years with worsening in the spring and fall.  Tried Zyrtec, Allegra, Singulair, Flonase, azelastine and Xyzal with some benefit.  No previous allergy evaluation.  Questionable nasal polyps as a child.  Today's skin testing showed: Positive to grass, ragweed, weed, trees, dust mites, cat, dog, horse, mold, cockroach.  Start environmental control measures as below.  May use over the counter antihistamines such as Zyrtec (cetirizine), Claritin (loratadine), Allegra (fexofenadine), or Xyzal (levocetirizine) daily as needed. May take twice a day if needed.  Start dymista (fluticasone + azelastine nasal spray combination) 1 spray per nostril twice a day.  This replaces all of your other nasal sprays for now. If it's not covered let us know.   Nasal saline spray (i.e., Simply Saline) or nasal saline lavage (i.e., NeilMed) is recommended as needed and prior to medicated nasal sprays.  May use olopatadine eye drops 0.2% once a day as needed for itchy/watery eyes.

## 2020-02-17 NOTE — Assessment & Plan Note (Signed)
   See assessment and plan as above for allergic rhinitis.  

## 2020-02-17 NOTE — Patient Instructions (Addendum)
Today's skin testing showed: Positive to grass, ragweed, weed, trees, dust mites, cat, dog, horse, mold, cockroach.  Asthma: . Get bloodwork to see if you would quality for biologics treatment for asthma.  . Daily controller medication(s): continue Breo 235mcg 1 puff once a day and rinse mouth after each use. o Continue with Singulair 10mg  daily. . Prior to physical activity: May use albuterol rescue inhaler 2 puffs 5 to 15 minutes prior to strenuous physical activities. Marland Kitchen Rescue medications: May use albuterol rescue inhaler 2 puffs or nebulizer every 4 to 6 hours as needed for shortness of breath, chest tightness, coughing, and wheezing. Monitor frequency of use.  . During upper respiratory infections/asthma flares: Start Flovent 2 puffs twice a day with spacer and rinse mouth afterwards for 1-2 weeks.  . Asthma control goals:  o Full participation in all desired activities (may need albuterol before activity) o Albuterol use two times or less a week on average (not counting use with activity) o Cough interfering with sleep two times or less a month o Oral steroids no more than once a year o No hospitalizations  Environmental allergies  Start environmental control measures as below.  May use over the counter antihistamines such as Zyrtec (cetirizine), Claritin (loratadine), Allegra (fexofenadine), or Xyzal (levocetirizine) daily as needed. May take twice a day if needed.  Start dymista (fluticasone + azelastine nasal spray combination) 1 spray per nostril twice a day.  This replaces all of your other nasal sprays for now. If it's not covered let us know.   Nasal saline spray (i.e., Simply Saline) or nasal saline lavage (i.e., NeilMed) is recommended as needed and prior to medicated nasal sprays.  May use olopatadine eye drops 0.2% once a day as needed for itchy/watery eyes.  Skin  See below for proper skin care.  Recommend patch testing.   Patches are best placed on Monday with  return to office on Wednesday and Friday of same week for readings.  Patches once placed should not get wet.  You do not have to stop any medications for patch testing but should not be on oral prednisone. You can schedule a patch testing visit when convenient for your schedule.  This is done in our [place] office.   Food  Continue to avoid almonds for now.  For mild symptoms you can take over the counter antihistamines such as Benadryl and monitor symptoms closely. If symptoms worsen or if you have severe symptoms including breathing issues, throat closure, significant swelling, whole body hives, severe diarrhea and vomiting, lightheadedness then inject epinephrine and seek immediate medical care afterwards.  Food action plan in place.   Follow up in 1 months or sooner if needed for asthma check.    Skin care recommendations  Bath time: . Always use lukewarm water. AVOID very hot or cold water. Marland Kitchen Keep bathing time to 5-10 minutes. . Do NOT use bubble bath. . Use a mild soap and use just enough to wash the dirty areas. . Do NOT scrub skin vigorously.  . After bathing, pat dry your skin with a towel. Do NOT rub or scrub the skin.  Moisturizers and prescriptions:  . ALWAYS apply moisturizers immediately after bathing (within 3 minutes). This helps to lock-in moisture. . Use the moisturizer several times a day over the whole body. Kermit Balo summer moisturizers include: Aveeno, CeraVe, Cetaphil. Kermit Balo winter moisturizers include: Aquaphor, Vaseline, Cerave, Cetaphil, Eucerin, Vanicream. . When using moisturizers along with medications, the moisturizer should be applied about  one hour after applying the medication to prevent diluting effect of the medication or moisturize around where you applied the medications. When not using medications, the moisturizer can be continued twice daily as maintenance.  Laundry and clothing: . Avoid laundry products with added color or perfumes. . Use  unscented hypo-allergenic laundry products such as Tide free, Cheer free & gentle, and All free and clear.  . If the skin still seems dry or sensitive, you can try double-rinsing the clothes. . Avoid tight or scratchy clothing such as wool. . Do not use fabric softeners or dyer sheets.  Reducing Pollen Exposure . Pollen seasons: trees (spring), grass (summer) and ragweed/weeds (fall). Marland Kitchen Keep windows closed in your home and car to lower pollen exposure.  Susa Simmonds air conditioning in the bedroom and throughout the house if possible.  . Avoid going out in dry windy days - especially early morning. . Pollen counts are highest between 5 - 10 AM and on dry, hot and windy days.  . Save outside activities for late afternoon or after a heavy rain, when pollen levels are lower.  . Avoid mowing of grass if you have grass pollen allergy. Marland Kitchen Be aware that pollen can also be transported indoors on people and pets.  . Dry your clothes in an automatic dryer rather than hanging them outside where they might collect pollen.  . Rinse hair and eyes before bedtime. Control of House Dust Mite Allergen . Dust mite allergens are a common trigger of allergy and asthma symptoms. While they can be found throughout the house, these microscopic creatures thrive in warm, humid environments such as bedding, upholstered furniture and carpeting. . Because so much time is spent in the bedroom, it is essential to reduce mite levels there.  . Encase pillows, mattresses, and box springs in special allergen-proof fabric covers or airtight, zippered plastic covers.  . Bedding should be washed weekly in hot water (130 F) and dried in a hot dryer. Allergen-proof covers are available for comforters and pillows that can't be regularly washed.  Wendee Copp the allergy-proof covers every few months. Minimize clutter in the bedroom. Keep pets out of the bedroom.  Marland Kitchen Keep humidity less than 50% by using a dehumidifier or air conditioning. You  can buy a humidity measuring device called a hygrometer to monitor this.  . If possible, replace carpets with hardwood, linoleum, or washable area rugs. If that's not possible, vacuum frequently with a vacuum that has a HEPA filter. . Remove all upholstered furniture and non-washable window drapes from the bedroom. . Remove all non-washable stuffed toys from the bedroom.  Wash stuffed toys weekly. Pet Allergen Avoidance: . Contrary to popular opinion, there are no "hypoallergenic" breeds of dogs or cats. That is because people are not allergic to an animal's hair, but to an allergen found in the animal's saliva, dander (dead skin flakes) or urine. Pet allergy symptoms typically occur within minutes. For some people, symptoms can build up and become most severe 8 to 12 hours after contact with the animal. People with severe allergies can experience reactions in public places if dander has been transported on the pet owners' clothing. Marland Kitchen Keeping an animal outdoors is only a partial solution, since homes with pets in the yard still have higher concentrations of animal allergens. . Before getting a pet, ask your allergist to determine if you are allergic to animals. If your pet is already considered part of your family, try to minimize contact and keep the  pet out of the bedroom and other rooms where you spend a great deal of time. . As with dust mites, vacuum carpets often or replace carpet with a hardwood floor, tile or linoleum. . High-efficiency particulate air (HEPA) cleaners can reduce allergen levels over time. . While dander and saliva are the source of cat and dog allergens, urine is the source of allergens from rabbits, hamsters, mice and [place] pigs; so ask a non-allergic family member to clean the animal's cage. . If you have a pet allergy, talk to your allergist about the potential for allergy immunotherapy (allergy shots). This strategy can often provide long-term relief. Cockroach Allergen  Avoidance Cockroaches are often found in the homes of densely populated urban areas, schools or commercial buildings, but these creatures can lurk almost anywhere. This does not mean that you have a dirty house or living area. . Block all areas where roaches can enter the home. This includes crevices, wall cracks and windows.  . Cockroaches need water to survive, so fix and seal all leaky faucets and pipes. Have an exterminator go through the house when your family and pets are gone to eliminate any remaining roaches. Marland Kitchen Keep food in lidded containers and put pet food dishes away after your pets are done eating. Vacuum and sweep the floor after meals, and take out garbage and recyclables. Use lidded garbage containers in the kitchen. Wash dishes immediately after use and clean under stoves, refrigerators or toasters where crumbs can accumulate. Wipe off the stove and other kitchen surfaces and cupboards regularly. Mold Control . Mold and fungi can grow on a variety of surfaces provided certain temperature and moisture conditions exist.  . Outdoor molds grow on plants, decaying vegetation and soil. The major outdoor mold, Alternaria and Cladosporium, are found in very high numbers during hot and dry conditions. Generally, a late summer - fall peak is seen for common outdoor fungal spores. Rain will temporarily lower outdoor mold spore count, but counts rise rapidly when the rainy period ends. . The most important indoor molds are Aspergillus and Penicillium. Dark, humid and poorly ventilated basements are ideal sites for mold growth. The next most common sites of mold growth are the bathroom and the kitchen. Outdoor (Seasonal) Mold Control . Use air conditioning and keep windows closed. . Avoid exposure to decaying vegetation. Marland Kitchen Avoid leaf raking. . Avoid grain handling. . Consider wearing a face mask if working in moldy areas.  Indoor (Perennial) Mold Control  . Maintain humidity below 50%. . Get rid  of mold growth on hard surfaces with water, detergent and, if necessary, 5% bleach (do not mix with other cleaners). Then dry the area completely. If mold covers an area more than 10 square feet, consider hiring an indoor environmental professional. . For clothing, washing with soap and water is best. If moldy items cannot be cleaned and dried, throw them away. . Remove sources e.g. contaminated carpets. . Repair and seal leaking roofs or pipes. Using dehumidifiers in damp basements may be helpful, but empty the water and clean units regularly to prevent mildew from forming. All rooms, especially basements, bathrooms and kitchens, require ventilation and cleaning to deter mold and mildew growth. Avoid carpeting on concrete or damp floors, and storing items in damp areas.

## 2020-02-17 NOTE — Assessment & Plan Note (Signed)
Breaking out on her arms, neck and under her armpits.  Using Vaseline and some type of steroid cream prescribed by her dermatologist with some benefit.  No specific triggers identified.  See below for proper skin care.  Recommend patch testing.   Patches are best placed on Monday with return to office on Wednesday and Friday of same week for readings.  Patches once placed should not get wet.  You do not have to stop any medications for patch testing but should not be on oral prednisone. You can schedule a patch testing visit when convenient for your schedule.  This is done in our [place] office.

## 2020-02-18 ENCOUNTER — Telehealth: Payer: Self-pay

## 2020-02-18 NOTE — Telephone Encounter (Signed)
Otila Kluver from [facility] called in needing to speak to the nurse or Dr, Nani Ravens about the patients Disability paper work. Please give Otila Kluver a call back as soon as possible at [phone] this is Otila Kluver direct line.

## 2020-02-19 ENCOUNTER — Other Ambulatory Visit: Payer: Self-pay | Admitting: Family Medicine

## 2020-02-19 DIAGNOSIS — J302 Other seasonal allergic rhinitis: Secondary | ICD-10-CM

## 2020-02-21 NOTE — Telephone Encounter (Signed)
Received paperwork and was forwarded to PCP

## 2020-02-22 ENCOUNTER — Telehealth: Payer: Self-pay

## 2020-02-22 NOTE — Telephone Encounter (Signed)
PA was approved and faxed to pharmacy

## 2020-02-22 NOTE — Telephone Encounter (Signed)
Received fax that a PA is needed for generic Dymista. PA has been completed and is awaiting approval or denial.

## 2020-03-03 ENCOUNTER — Telehealth: Payer: Self-pay | Admitting: Family Medicine

## 2020-03-03 MED ORDER — EPINEPHRINE 0.3 MG/0.3ML IJ SOAJ: 0.3000 mg | INTRAMUSCULAR | 2 refills | Status: DC | PRN

## 2020-03-03 NOTE — Telephone Encounter (Signed)
Patient is requesting a refill on a epi pen.   Patient uses :   [id number] - [place], [address] [place] [address]  Phone:  [phone] Fax:  [phone]  DEA #:  [id number]

## 2020-03-03 NOTE — Telephone Encounter (Signed)
I do not see this in her past/or current list

## 2020-03-16 ENCOUNTER — Ambulatory Visit: Payer: BC Managed Care – PPO | Admitting: Allergy

## 2020-03-20 DIAGNOSIS — H1010 Acute atopic conjunctivitis, unspecified eye: Secondary | ICD-10-CM | POA: Insufficient documentation

## 2020-03-20 DIAGNOSIS — J302 Other seasonal allergic rhinitis: Secondary | ICD-10-CM | POA: Insufficient documentation

## 2020-03-20 NOTE — Progress Notes (Signed)
Follow Up Note  RE: Kimberly Ali MRN: [id number] DOB: [birth year] Date of Office Visit: 03/21/2020  Referring provider: Shelda Pal* Primary care provider: Shelda Pal, DO  Chief Complaint: Allergic Rhinitis  (taking all medication except nasal spray needed PA states allergies are all over the place) and Asthma  History of Present Illness: I had the pleasure of seeing Kimberly Ali for a follow up visit at the [facility] on 03/21/2020. She is a 44 y.o. female, who is being followed for asthma, allergic rhino conjunctivitis, adverse food reaction, atopic dermatitis. Her previous allergy office visit was on 02/17/2020 with Dr. Maudie Mercury. Today is a regular follow up visit.  Bloodwork was done 2 days ago.  Severe persistent asthma  Currently using Breo 276mcg 1 puff once a day. Had a flare in the beginning of July while in [place]. No albuterol use since that episode.  Denies any ER/urgent care visits or prednisone use since the last visit.  Allergic rhino conjunctivitis Currently taking Xyzal once a day and Singulair. Did not try dymista nasal spray yet due to insurance coverage. No eye drops use.  Still having nasal congestion.   Adverse food reaction Avoiding almonds and no reactions since last visit.   Other atopic dermatitis Using fragrance free and dye free products and skin has been doing well.   Flares under armpits at times when using deodorant so stopped using deodorant.  Assessment and Plan: Ralene Bathe is a 44 y.o. female with: Severe persistent asthma without complication Past history - Diagnosed with asthma over 30 years ago.  Recently had an ER visit on April 1st after losing consciousness while receiving a nebulizer treatment.  Main triggers include infections and allergies.  Started on Breo 201 puff once a day for the last month and noticing improvement. Patient was hospitalized in 2015 in the ICU for asthma  exacerbation.  No previous asthma Biologics.  2021 spirometry showed some mild obstruction however patient just used her inhaler prior to the appointment.  No improvement in FEV1 post bronchodilator treatment. Interim history - got bloodwork 2 days ago. Doing much better with below regimen.  . Will message you once I have the bloodwork results regarding starting biologics.  . Daily controller medication(s): continue Breo 251mcg 1 puff once a day and rinse mouth after each use. o Continue with Singulair 10mg  daily. . Prior to physical activity: May use albuterol rescue inhaler 2 puffs 5 to 15 minutes prior to strenuous physical activities. Marland Kitchen Rescue medications: May use albuterol rescue inhaler 2 puffs or nebulizer every 4 to 6 hours as needed for shortness of breath, chest tightness, coughing, and wheezing. Monitor frequency of use.  . During upper respiratory infections/asthma flares: Start Flovent 2 puffs twice a day with spacer and rinse mouth afterwards for 1-2 weeks.   Seasonal and perennial allergic rhinoconjunctivitis Past history - Perennial rhinoconjunctivitis symptoms in the last 4 years with worsening in the spring and fall.  Tried Zyrtec, Allegra, Singulair, Flonase, azelastine and Xyzal with some benefit. Questionable nasal polyps as a child. 2021 skin testing showed: Positive to grass, ragweed, weed, trees, dust mites, cat, dog, horse, mold, cockroach. Interim history - unable to start dymista yet due to insurance issues. Still having rhinitis symptoms.  Continue environmental control measures as below.  May use over the counter antihistamines such as Zyrtec (cetirizine), Claritin (loratadine), Allegra (fexofenadine), or Xyzal (levocetirizine) daily as needed. May take twice a day if needed.  Start dymista (fluticasone +  azelastine nasal spray combination) 1 spray per nostril twice a day.  This replaces all of your other nasal sprays for now. If it's not covered let us know.   Nasal  saline spray (i.e., Simply Saline) or nasal saline lavage (i.e., NeilMed) is recommended as needed and prior to medicated nasal sprays.  May use olopatadine eye drops 0.2% once a day as needed for itchy/watery eyes.  Read about allergy injections.   Other atopic dermatitis Past history - Breaking out on her arms, neck and under her armpits.  Using Vaseline and some type of steroid cream prescribed by her dermatologist with some benefit.  Interim history - deodorants seem to flare her symptoms.  Continue proper skin care.  Recommend patch testing if eczema flares up.   May use desonide ointment twice a day for eczema flares on the neck. Do not use more than 2 weeks in a row.  Adverse food reaction Past history - Almonds causes perioral pruritus and hives.  Tolerates peanuts cashews and pecans with no issues. 2021 skin testing was negative to foods including almonds.  Continue to avoid almonds for now.  For mild symptoms you can take over the counter antihistamines such as Benadryl and monitor symptoms closely. If symptoms worsen or if you have severe symptoms including breathing issues, throat closure, significant swelling, whole body hives, severe diarrhea and vomiting, lightheadedness then inject epinephrine and seek immediate medical care afterwards.  Food action plan in place.   Return in about 2 months (around 05/22/2020).  Meds ordered this encounter  Medications  . desonide (DESOWEN) 0.05 % ointment    Sig: Apply 1 application topically 2 (two) times daily. Use twice a day if needed on eczema flares. Stop when it clears up.    Dispense:  60 g    Refill:  1   Diagnostics: Spirometry:  Tracings reviewed. Her effort: Good reproducible efforts. FVC: 2.86L FEV1: 2.05L, 77% predicted FEV1/FVC ratio: 72% Interpretation: Spirometry consistent with normal pattern.  Please see scanned spirometry results for details.  Medication List:  Current Outpatient Medications  Medication  Sig Dispense Refill  . albuterol (VENTOLIN HFA) 108 (90 Base) MCG/ACT inhaler Inhale 1-2 puffs into the lungs every 6 (six) hours as needed for wheezing or shortness of breath. 18 g 1  . Azelastine-Fluticasone 137-50 MCG/ACT SUSP Place 1 spray into the nose in the morning and at bedtime. 23 g 5  . EPINEPHrine 0.3 mg/0.3 mL IJ SOAJ injection Inject 0.3 mLs (0.3 mg total) into the muscle as needed for anaphylaxis. 2 each 2  . FLOVENT HFA 110 MCG/ACT inhaler SMARTSIG:2 Puff(s) By Mouth Twice Daily    . fluticasone furoate-vilanterol (BREO ELLIPTA) 200-25 MCG/INH AEPB Inhale 1 puff into the lungs daily. 30 each 2  . levocetirizine (XYZAL) 5 MG tablet TAKE 1 TABLET BY MOUTH EVERY DAY IN THE EVENING 30 tablet 2  . montelukast (SINGULAIR) 10 MG tablet Take 1 tablet (10 mg total) by mouth at bedtime. 30 tablet 5  . naproxen sodium (ALEVE) 220 MG tablet Take 440 mg by mouth as needed (pain).    . Olopatadine HCl 0.2 % SOLN Apply 1 drop to eye daily as needed (itchy/watery eyes). 2.5 mL 5  . Spacer/Aero-Hold Chamber Mask MISC Use with inhaler 1 each 0  . desonide (DESOWEN) 0.05 % ointment Apply 1 application topically 2 (two) times daily. Use twice a day if needed on eczema flares. Stop when it clears up. 60 g 1   No current facility-administered medications  for this visit.   Allergies: Allergies  Allergen Reactions  . Almond (Diagnostic) Itching and Swelling   I reviewed her past medical history, social history, family history, and environmental history and no significant changes have been reported from her previous visit.  Review of Systems  Constitutional: Negative for appetite change, chills, fever and unexpected weight change.  HENT: Positive for congestion and rhinorrhea. Negative for sneezing.   Eyes: Negative for itching.  Respiratory: Negative for cough, chest tightness, shortness of breath and wheezing.   Cardiovascular: Negative for chest pain.  Gastrointestinal: Negative for abdominal  pain.  Genitourinary: Negative for difficulty urinating.  Skin: Positive for rash.  Allergic/Immunologic: Positive for environmental allergies.  Neurological: Negative for headaches.   Objective: BP (!) 150/100 (BP Location: Right Arm, Patient Position: Sitting, Cuff Size: Normal)   Pulse 84   Resp 16   SpO2 99%  There is no height or weight on file to calculate BMI. Physical Exam Vitals and nursing note reviewed.  Constitutional:      Appearance: Normal appearance. She is well-developed.  HENT:     Head: Normocephalic and atraumatic.     Right Ear: Tympanic membrane and external ear normal.     Left Ear: Tympanic membrane and external ear normal.     Nose: Rhinorrhea present.     Mouth/Throat:     Mouth: Mucous membranes are moist.     Pharynx: Oropharynx is clear.  Eyes:     Conjunctiva/sclera: Conjunctivae normal.  Cardiovascular:     Rate and Rhythm: Normal rate and regular rhythm.     Heart sounds: Normal heart sounds. No murmur heard.  No friction rub. No gallop.   Pulmonary:     Effort: Pulmonary effort is normal.     Breath sounds: Normal breath sounds. No wheezing, rhonchi or rales.  Musculoskeletal:     Cervical back: Neck supple.  Skin:    General: Skin is warm.     Findings: No rash.  Neurological:     Mental Status: She is alert and oriented to person, place, and time.  Psychiatric:        Mood and Affect: Mood normal.        Behavior: Behavior normal.    Previous notes and tests were reviewed. The plan was reviewed with the patient/family, and all questions/concerned were addressed.  It was my pleasure to see Ralene Bathe today and participate in her care. Please feel free to contact me with any questions or concerns.  Sincerely,  Rexene Alberts, DO Allergy & Immunology  [facility] office: [phone] [facility] office: [place] office: [phone]

## 2020-03-21 ENCOUNTER — Ambulatory Visit (INDEPENDENT_AMBULATORY_CARE_PROVIDER_SITE_OTHER): Payer: BC Managed Care – PPO | Admitting: Allergy

## 2020-03-21 ENCOUNTER — Encounter: Payer: Self-pay | Admitting: Allergy

## 2020-03-21 ENCOUNTER — Other Ambulatory Visit: Payer: Self-pay

## 2020-03-21 VITALS — BP 150/100 | HR 84 | Resp 16

## 2020-03-21 DIAGNOSIS — L2089 Other atopic dermatitis: Secondary | ICD-10-CM

## 2020-03-21 DIAGNOSIS — H1010 Acute atopic conjunctivitis, unspecified eye: Secondary | ICD-10-CM

## 2020-03-21 DIAGNOSIS — J302 Other seasonal allergic rhinitis: Secondary | ICD-10-CM | POA: Diagnosis not present

## 2020-03-21 DIAGNOSIS — J4550 Severe persistent asthma, uncomplicated: Secondary | ICD-10-CM | POA: Diagnosis not present

## 2020-03-21 DIAGNOSIS — T781XXD Other adverse food reactions, not elsewhere classified, subsequent encounter: Secondary | ICD-10-CM | POA: Diagnosis not present

## 2020-03-21 DIAGNOSIS — J3089 Other allergic rhinitis: Secondary | ICD-10-CM

## 2020-03-21 MED ORDER — DESONIDE 0.05 % EX OINT: 1.0000 "application " | TOPICAL_OINTMENT | Freq: Two times a day (BID) | CUTANEOUS | 1 refills | Status: DC

## 2020-03-21 NOTE — Assessment & Plan Note (Signed)
Past history - Diagnosed with asthma over 30 years ago.  Recently had an ER visit on April 1st after losing consciousness while receiving a nebulizer treatment.  Main triggers include infections and allergies.  Started on Breo 201 puff once a day for the last month and noticing improvement. Patient was hospitalized in 2015 in the ICU for asthma exacerbation.  No previous asthma Biologics.  2021 spirometry showed some mild obstruction however patient just used her inhaler prior to the appointment.  No improvement in FEV1 post bronchodilator treatment. Interim history - got bloodwork 2 days ago. Doing much better with below regimen.  . Will message you once I have the bloodwork results regarding starting biologics.  . Daily controller medication(s): continue Breo 23mcg 1 puff once a day and rinse mouth after each use. o Continue with Singulair 10mg  daily. . Prior to physical activity: May use albuterol rescue inhaler 2 puffs 5 to 15 minutes prior to strenuous physical activities. Marland Kitchen Rescue medications: May use albuterol rescue inhaler 2 puffs or nebulizer every 4 to 6 hours as needed for shortness of breath, chest tightness, coughing, and wheezing. Monitor frequency of use.  . During upper respiratory infections/asthma flares: Start Flovent 2 puffs twice a day with spacer and rinse mouth afterwards for 1-2 weeks.

## 2020-03-21 NOTE — Assessment & Plan Note (Signed)
Past history - Breaking out on her arms, neck and under her armpits.  Using Vaseline and some type of steroid cream prescribed by her dermatologist with some benefit.  Interim history - deodorants seem to flare her symptoms.  Continue proper skin care.  Recommend patch testing if eczema flares up.   May use desonide ointment twice a day for eczema flares on the neck. Do not use more than 2 weeks in a row.

## 2020-03-21 NOTE — Assessment & Plan Note (Signed)
Past history - Perennial rhinoconjunctivitis symptoms in the last 4 years with worsening in the spring and fall.  Tried Zyrtec, Allegra, Singulair, Flonase, azelastine and Xyzal with some benefit. Questionable nasal polyps as a child. 2021 skin testing showed: Positive to grass, ragweed, weed, trees, dust mites, cat, dog, horse, mold, cockroach. Interim history - unable to start dymista yet due to insurance issues. Still having rhinitis symptoms.  Continue environmental control measures as below.  May use over the counter antihistamines such as Zyrtec (cetirizine), Claritin (loratadine), Allegra (fexofenadine), or Xyzal (levocetirizine) daily as needed. May take twice a day if needed.  Start dymista (fluticasone + azelastine nasal spray combination) 1 spray per nostril twice a day.  This replaces all of your other nasal sprays for now. If it's not covered let us know.   Nasal saline spray (i.e., Simply Saline) or nasal saline lavage (i.e., NeilMed) is recommended as needed and prior to medicated nasal sprays.  May use olopatadine eye drops 0.2% once a day as needed for itchy/watery eyes.  Read about allergy injections.

## 2020-03-21 NOTE — Assessment & Plan Note (Signed)
Past history - Almonds causes perioral pruritus and hives.  Tolerates peanuts cashews and pecans with no issues. 2021 skin testing was negative to foods including almonds.  Continue to avoid almonds for now.  For mild symptoms you can take over the counter antihistamines such as Benadryl and monitor symptoms closely. If symptoms worsen or if you have severe symptoms including breathing issues, throat closure, significant swelling, whole body hives, severe diarrhea and vomiting, lightheadedness then inject epinephrine and seek immediate medical care afterwards.  Food action plan in place.

## 2020-03-21 NOTE — Patient Instructions (Addendum)
Asthma: . Will message you once I have the bloodwork results. . Daily controller medication(s): continue Breo 25mcg 1 puff once a day and rinse mouth after each use. o Continue with Singulair 10mg  daily. . Prior to physical activity: May use albuterol rescue inhaler 2 puffs 5 to 15 minutes prior to strenuous physical activities. Marland Kitchen Rescue medications: May use albuterol rescue inhaler 2 puffs or nebulizer every 4 to 6 hours as needed for shortness of breath, chest tightness, coughing, and wheezing. Monitor frequency of use.  . During upper respiratory infections/asthma flares: Start Flovent 2 puffs twice a day with spacer and rinse mouth afterwards for 1-2 weeks.  . Asthma control goals:  o Full participation in all desired activities (may need albuterol before activity) o Albuterol use two times or less a week on average (not counting use with activity) o Cough interfering with sleep two times or less a month o Oral steroids no more than once a year o No hospitalizations  Environmental allergies  Past skin testing: Positive to grass, ragweed, weed, trees, dust mites, cat, dog, horse, mold, cockroach.  Continue environmental control measures as below.  May use over the counter antihistamines such as Zyrtec (cetirizine), Claritin (loratadine), Allegra (fexofenadine), or Xyzal (levocetirizine) daily as needed. May take twice a day if needed.  Start dymista (fluticasone + azelastine nasal spray combination) 1 spray per nostril twice a day.  This replaces all of your other nasal sprays for now. If it's not covered let us know.   Nasal saline spray (i.e., Simply Saline) or nasal saline lavage (i.e., NeilMed) is recommended as needed and prior to medicated nasal sprays.  May use olopatadine eye drops 0.2% once a day as needed for itchy/watery eyes.  Read about allergy injections.   Skin  See below for proper skin care.  Recommend patch testing if eczema flares up.   May use desonide  ointment twice a day for eczema flares on the neck. Do not use more than 2 weeks in a row.  Patches are best placed on Monday with return to office on Wednesday and Friday of same week for readings.  Patches once placed should not get wet.  You do not have to stop any medications for patch testing but should not be on oral prednisone. You can schedule a patch testing visit when convenient for your schedule.  This is done in our [place] office.   Food  Continue to avoid almonds for now.  For mild symptoms you can take over the counter antihistamines such as Benadryl and monitor symptoms closely. If symptoms worsen or if you have severe symptoms including breathing issues, throat closure, significant swelling, whole body hives, severe diarrhea and vomiting, lightheadedness then inject epinephrine and seek immediate medical care afterwards.  Food action plan in place.   Follow up in 2 months or sooner if needed.    Skin care recommendations  Bath time: . Always use lukewarm water. AVOID very hot or cold water. Marland Kitchen Keep bathing time to 5-10 minutes. . Do NOT use bubble bath. . Use a mild soap and use just enough to wash the dirty areas. . Do NOT scrub skin vigorously.  . After bathing, pat dry your skin with a towel. Do NOT rub or scrub the skin.  Moisturizers and prescriptions:  . ALWAYS apply moisturizers immediately after bathing (within 3 minutes). This helps to lock-in moisture. . Use the moisturizer several times a day over the whole body. Kermit Balo summer moisturizers include: Aveeno, CeraVe,  Cetaphil. Kermit Balo winter moisturizers include: Aquaphor, Vaseline, Cerave, Cetaphil, Eucerin, Vanicream. . When using moisturizers along with medications, the moisturizer should be applied about one hour after applying the medication to prevent diluting effect of the medication or moisturize around where you applied the medications. When not using medications, the moisturizer can be continued twice  daily as maintenance.  Laundry and clothing: . Avoid laundry products with added color or perfumes. . Use unscented hypo-allergenic laundry products such as Tide free, Cheer free & gentle, and All free and clear.  . If the skin still seems dry or sensitive, you can try double-rinsing the clothes. . Avoid tight or scratchy clothing such as wool. . Do not use fabric softeners or dyer sheets.  Reducing Pollen Exposure . Pollen seasons: trees (spring), grass (summer) and ragweed/weeds (fall). Marland Kitchen Keep windows closed in your home and car to lower pollen exposure.  Susa Simmonds air conditioning in the bedroom and throughout the house if possible.  . Avoid going out in dry windy days - especially early morning. . Pollen counts are highest between 5 - 10 AM and on dry, hot and windy days.  . Save outside activities for late afternoon or after a heavy rain, when pollen levels are lower.  . Avoid mowing of grass if you have grass pollen allergy. Marland Kitchen Be aware that pollen can also be transported indoors on people and pets.  . Dry your clothes in an automatic dryer rather than hanging them outside where they might collect pollen.  . Rinse hair and eyes before bedtime. Control of House Dust Mite Allergen . Dust mite allergens are a common trigger of allergy and asthma symptoms. While they can be found throughout the house, these microscopic creatures thrive in warm, humid environments such as bedding, upholstered furniture and carpeting. . Because so much time is spent in the bedroom, it is essential to reduce mite levels there.  . Encase pillows, mattresses, and box springs in special allergen-proof fabric covers or airtight, zippered plastic covers.  . Bedding should be washed weekly in hot water (130 F) and dried in a hot dryer. Allergen-proof covers are available for comforters and pillows that can't be regularly washed.  Wendee Copp the allergy-proof covers every few months. Minimize clutter in the bedroom.  Keep pets out of the bedroom.  Marland Kitchen Keep humidity less than 50% by using a dehumidifier or air conditioning. You can buy a humidity measuring device called a hygrometer to monitor this.  . If possible, replace carpets with hardwood, linoleum, or washable area rugs. If that's not possible, vacuum frequently with a vacuum that has a HEPA filter. . Remove all upholstered furniture and non-washable window drapes from the bedroom. . Remove all non-washable stuffed toys from the bedroom.  Wash stuffed toys weekly. Pet Allergen Avoidance: . Contrary to popular opinion, there are no "hypoallergenic" breeds of dogs or cats. That is because people are not allergic to an animal's hair, but to an allergen found in the animal's saliva, dander (dead skin flakes) or urine. Pet allergy symptoms typically occur within minutes. For some people, symptoms can build up and become most severe 8 to 12 hours after contact with the animal. People with severe allergies can experience reactions in public places if dander has been transported on the pet owners' clothing. Marland Kitchen Keeping an animal outdoors is only a partial solution, since homes with pets in the yard still have higher concentrations of animal allergens. . Before getting a pet, ask your allergist  to determine if you are allergic to animals. If your pet is already considered part of your family, try to minimize contact and keep the pet out of the bedroom and other rooms where you spend a great deal of time. . As with dust mites, vacuum carpets often or replace carpet with a hardwood floor, tile or linoleum. . High-efficiency particulate air (HEPA) cleaners can reduce allergen levels over time. . While dander and saliva are the source of cat and dog allergens, urine is the source of allergens from rabbits, hamsters, mice and [place] pigs; so ask a non-allergic family member to clean the animal's cage. . If you have a pet allergy, talk to your allergist about the potential for  allergy immunotherapy (allergy shots). This strategy can often provide long-term relief. Cockroach Allergen Avoidance Cockroaches are often found in the homes of densely populated urban areas, schools or commercial buildings, but these creatures can lurk almost anywhere. This does not mean that you have a dirty house or living area. . Block all areas where roaches can enter the home. This includes crevices, wall cracks and windows.  . Cockroaches need water to survive, so fix and seal all leaky faucets and pipes. Have an exterminator go through the house when your family and pets are gone to eliminate any remaining roaches. Marland Kitchen Keep food in lidded containers and put pet food dishes away after your pets are done eating. Vacuum and sweep the floor after meals, and take out garbage and recyclables. Use lidded garbage containers in the kitchen. Wash dishes immediately after use and clean under stoves, refrigerators or toasters where crumbs can accumulate. Wipe off the stove and other kitchen surfaces and cupboards regularly. Mold Control . Mold and fungi can grow on a variety of surfaces provided certain temperature and moisture conditions exist.  . Outdoor molds grow on plants, decaying vegetation and soil. The major outdoor mold, Alternaria and Cladosporium, are found in very high numbers during hot and dry conditions. Generally, a late summer - fall peak is seen for common outdoor fungal spores. Rain will temporarily lower outdoor mold spore count, but counts rise rapidly when the rainy period ends. . The most important indoor molds are Aspergillus and Penicillium. Dark, humid and poorly ventilated basements are ideal sites for mold growth. The next most common sites of mold growth are the bathroom and the kitchen. Outdoor (Seasonal) Mold Control . Use air conditioning and keep windows closed. . Avoid exposure to decaying vegetation. Marland Kitchen Avoid leaf raking. . Avoid grain handling. . Consider wearing a face  mask if working in moldy areas.  Indoor (Perennial) Mold Control  . Maintain humidity below 50%. . Get rid of mold growth on hard surfaces with water, detergent and, if necessary, 5% bleach (do not mix with other cleaners). Then dry the area completely. If mold covers an area more than 10 square feet, consider hiring an indoor environmental professional. . For clothing, washing with soap and water is best. If moldy items cannot be cleaned and dried, throw them away. . Remove sources e.g. contaminated carpets. . Repair and seal leaking roofs or pipes. Using dehumidifiers in damp basements may be helpful, but empty the water and clean units regularly to prevent mildew from forming. All rooms, especially basements, bathrooms and kitchens, require ventilation and cleaning to deter mold and mildew growth. Avoid carpeting on concrete or damp floors, and storing items in damp areas.

## 2020-03-24 ENCOUNTER — Ambulatory Visit: Payer: BC Managed Care – PPO | Admitting: Family Medicine

## 2020-03-24 LAB — CBC WITH DIFFERENTIAL/PLATELET
Basophils Absolute: 0 10*3/uL (ref 0.0–0.2)
Basos: 0 %
EOS (ABSOLUTE): 0.5 10*3/uL — ABNORMAL HIGH (ref 0.0–0.4)
Eos: 7 %
Hematocrit: 38 % (ref 34.0–46.6)
Hemoglobin: 12.1 g/dL (ref 11.1–15.9)
Immature Grans (Abs): 0 10*3/uL (ref 0.0–0.1)
Immature Granulocytes: 0 %
Lymphocytes Absolute: 1.9 10*3/uL (ref 0.7–3.1)
Lymphs: 27 %
MCH: 26.5 pg — ABNORMAL LOW (ref 26.6–33.0)
MCHC: 31.8 g/dL (ref 31.5–35.7)
MCV: 83 fL (ref 79–97)
Monocytes Absolute: 0.5 10*3/uL (ref 0.1–0.9)
Monocytes: 8 %
Neutrophils Absolute: 4.2 10*3/uL (ref 1.4–7.0)
Neutrophils: 58 %
Platelets: 306 10*3/uL (ref 150–450)
RBC: 4.57 x10E6/uL (ref 3.77–5.28)
RDW: 13.1 % (ref 11.7–15.4)
WBC: 7.1 10*3/uL (ref 3.4–10.8)

## 2020-03-24 LAB — PEANUT COMPONENTS
F352-IgE Ara h 8: <0.1 kU/L
F422-IgE Ara h 1: <0.1 kU/L
F423-IgE Ara h 2: <0.1 kU/L
F424-IgE Ara h 3: <0.1 kU/L
F427-IgE Ara h 9: 3.46 kU/L — AB
F447-IgE Ara h 6: <0.1 kU/L

## 2020-03-24 LAB — PANEL 604726
Cor A 1 IgE: 14.5 kU/L — AB
Cor A 14 IgE: 0.13 kU/L — AB
Cor A 8 IgE: 0.32 kU/L — AB
Cor A 9 IgE: <0.1 kU/L

## 2020-03-24 LAB — IGE NUT PROF. W/COMPONENT RFLX
F017-IgE Hazelnut (Filbert): 22.6 kU/L — AB
F018-IgE Brazil Nut: 0.15 kU/L — AB
F020-IgE Almond: 1.57 kU/L — AB
F202-IgE Cashew Nut: <0.1 kU/L
F203-IgE Pistachio Nut: 0.19 kU/L — AB
F256-IgE Walnut: 21.1 kU/L — AB
Macadamia Nut, IgE: 8.23 kU/L — AB
Peanut, IgE: 3.92 kU/L — AB
Pecan Nut IgE: 0.2 kU/L — AB

## 2020-03-24 LAB — PANEL 604721
Jug R 1 IgE: <0.1 kU/L
Jug R 3 IgE: 2.31 kU/L — AB

## 2020-03-24 LAB — IGE: IgE (Immunoglobulin E), Serum: 2467 IU/mL — ABNORMAL HIGH (ref 6–495)

## 2020-03-24 LAB — ALLERGEN COMPONENT COMMENTS

## 2020-03-24 LAB — PANEL 604350: Ber E 1 IgE: 0.33 kU/L — AB

## 2020-04-01 ENCOUNTER — Other Ambulatory Visit: Payer: Self-pay | Admitting: Family Medicine

## 2020-04-01 DIAGNOSIS — J4540 Moderate persistent asthma, uncomplicated: Secondary | ICD-10-CM

## 2020-04-03 ENCOUNTER — Encounter: Payer: Self-pay | Admitting: Family Medicine

## 2020-04-03 ENCOUNTER — Ambulatory Visit (INDEPENDENT_AMBULATORY_CARE_PROVIDER_SITE_OTHER): Payer: BC Managed Care – PPO | Admitting: Family Medicine

## 2020-04-03 ENCOUNTER — Other Ambulatory Visit: Payer: Self-pay

## 2020-04-03 VITALS — BP 152/110 | HR 96 | Temp 98.7°F | Ht 65.0 in | Wt 163.1 lb

## 2020-04-03 DIAGNOSIS — R0681 Apnea, not elsewhere classified: Secondary | ICD-10-CM | POA: Diagnosis not present

## 2020-04-03 DIAGNOSIS — I10 Essential (primary) hypertension: Secondary | ICD-10-CM | POA: Diagnosis not present

## 2020-04-03 MED ORDER — AMLODIPINE BESYLATE 5 MG PO TABS: 5.0000 mg | ORAL_TABLET | Freq: Every day | ORAL | 3 refills | Status: DC

## 2020-04-03 NOTE — Patient Instructions (Signed)
Keep the diet clean and stay active.  If you do not hear anything about your referral in the next 1-2 weeks, call our office and ask for an update.  Let me know if there is anything with disability or paperwork I can help out with.  Let us know if you need anything.

## 2020-04-03 NOTE — Progress Notes (Signed)
Chief Complaint  Patient presents with  . Hypertension    Subjective Kimberly Ali is a 44 y.o. female who presents for hypertension evalation. She does monitor home blood pressures. Blood pressures ranging from 140-150's/90-100's on average. She is is not on any medications. Patient has these side effects of medication: none She is usually adhering to a healthy diet overall. Current exercise: active at work +famhx of HTN in dad, dx'd in 50's.  + snoring, sometimes witnessed apneic episodes   Past Medical History:  Diagnosis Date  . Anal skin tag   . Eczema   . Headache   . History of hay fever   . History of migraine   . Moderate persistent asthma 12/20/2016  . Urticaria     Exam BP (!) 152/110 (BP Location: Right Arm, Patient Position: Sitting, Cuff Size: Normal)   Pulse 96   Temp 98.7 F (37.1 C) (Oral)   Ht 5\' 5"  (1.651 m)   Wt 163 lb 2 oz (74 kg)   SpO2 99%   BMI 27.15 kg/m  General:  well developed, well nourished, in no apparent distress Heart: RRR, no bruits, no LE edema Lungs: clear to auscultation, no accessory muscle use Psych: well oriented with normal range of affect and appropriate judgment/insight  Hypertension, unspecified type - Plan: amLODipine (NORVASC) 5 MG tablet  Witnessed apneic spells - Plan: Ambulatory referral to Pulmonology  Start Norvasc. Monitor BP at home. Counseled on diet and exercise. OSA eval.  F/u in 1 mo to reck. The patient voiced understanding and agreement to the plan.  [place], DO 04/03/20  1:19 PM

## 2020-04-13 ENCOUNTER — Ambulatory Visit: Payer: BC Managed Care – PPO | Attending: Family

## 2020-04-13 DIAGNOSIS — Z23 Encounter for immunization: Secondary | ICD-10-CM

## 2020-04-14 ENCOUNTER — Encounter: Payer: Self-pay | Admitting: Allergy

## 2020-04-21 NOTE — Progress Notes (Signed)
   Covid-19 Vaccination Clinic  Name:  Kimberly Ali    MRN: [id number] DOB: 11-20-1975  04/21/2020  [name] was observed post Covid-19 immunization for 15 minutes without incident. She was provided with Vaccine Information Sheet and instruction to access the V-Safe system.   [name] was instructed to call 911 with any severe reactions post vaccine: Marland Kitchen Difficulty breathing  . Swelling of face and throat  . A fast heartbeat  . A bad rash all over body  . Dizziness and weakness   Immunizations Administered    Name Date Dose VIS Date Route   Pfizer COVID-19 Vaccine 04/13/2020 11:00 AM 0.3 mL 10/20/2018 Intramuscular   Manufacturer: [place]   Lot: [id number]   [place]: [address]-7

## 2020-04-24 ENCOUNTER — Other Ambulatory Visit: Payer: Self-pay | Admitting: Family Medicine

## 2020-04-24 DIAGNOSIS — J4540 Moderate persistent asthma, uncomplicated: Secondary | ICD-10-CM

## 2020-04-24 MED ORDER — BREO ELLIPTA 200-25 MCG/INH IN AEPB: INHALATION_SPRAY | RESPIRATORY_TRACT | 2 refills | Status: DC

## 2020-05-05 ENCOUNTER — Ambulatory Visit: Payer: BC Managed Care – PPO | Admitting: Family Medicine

## 2020-05-05 DIAGNOSIS — Z0289 Encounter for other administrative examinations: Secondary | ICD-10-CM

## 2020-05-11 ENCOUNTER — Other Ambulatory Visit: Payer: Self-pay | Admitting: Family Medicine

## 2020-05-11 DIAGNOSIS — J302 Other seasonal allergic rhinitis: Secondary | ICD-10-CM

## 2020-05-18 ENCOUNTER — Ambulatory Visit: Payer: BC Managed Care – PPO | Attending: Family

## 2020-05-18 ENCOUNTER — Other Ambulatory Visit: Payer: Self-pay | Admitting: Family Medicine

## 2020-05-18 DIAGNOSIS — Z23 Encounter for immunization: Secondary | ICD-10-CM

## 2020-05-18 DIAGNOSIS — G43809 Other migraine, not intractable, without status migrainosus: Secondary | ICD-10-CM

## 2020-05-24 NOTE — Progress Notes (Deleted)
Follow Up Note  RE: Kimberly Ali MRN: [id number] DOB: 02-Nov-1975 Date of Office Visit: 05/25/2020  Referring provider: Shelda Pal* Primary care provider: Shelda Pal, DO  Chief Complaint: No chief complaint on file.  History of Present Illness: I had the pleasure of seeing Kimberly Ali for a follow up visit at the [facility] on 05/24/2020. She is a 43 y.o. female, who is being followed for asthma, allergic rhinoconjunctivitis, atopic dermatitis and adverse food reaction. Her previous allergy office visit was on 03/21/2020 with Dr. Maudie Mercury. Today is a regular follow up visit.  Severe persistent asthma without complication Past history - Diagnosed with asthma over 30 years ago.  Recently had an ER visit on April 1st after losing consciousness while receiving a nebulizer treatment.  Main triggers include infections and allergies.  Started on Breo 201 puff once a day for the last month and noticing improvement. Patient was hospitalized in 2015 in the ICU for asthma exacerbation.  No previous asthma Biologics.  2021 spirometry showed some mild obstruction however patient just used her inhaler prior to the appointment.  No improvement in FEV1 post bronchodilator treatment. Interim history - got bloodwork 2 days ago. Doing much better with below regimen.   Will message you once I have the bloodwork results regarding starting biologics.   Daily controller medication(s):continue Breo 253mcg 1 puff once a day and rinse mouth after each use. ? Continue with Singulair 10mg  daily.  Prior to physical activity:May use albuterol rescue inhaler 2 puffs 5 to 15 minutes prior to strenuous physical activities.  Rescue medications:May use albuterol rescue inhaler 2 puffs or nebulizer every 4 to 6 hours as needed for shortness of breath, chest tightness, coughing, and wheezing. Monitor frequency of use.   During upper respiratory infections/asthma flares:  Start Flovent 2 puffs twice a day with spacer and rinse mouth afterwards for 1-2 weeks.   Seasonal and perennial allergic rhinoconjunctivitis Past history - Perennial rhinoconjunctivitis symptoms in the last 4 years with worsening in the spring and fall.  Tried Zyrtec, Allegra, Singulair, Flonase, azelastine and Xyzal with some benefit. Questionable nasal polyps as a child. 2021 skin testing showed: Positive to grass, ragweed, weed, trees, dust mites, cat, dog, horse, mold, cockroach. Interim history - unable to start dymista yet due to insurance issues. Still having rhinitis symptoms.  Continue environmental control measures as below.  May use over the counter antihistamines such as Zyrtec (cetirizine), Claritin (loratadine), Allegra (fexofenadine), or Xyzal (levocetirizine) daily as needed. May take twice a day if needed.   Start dymista (fluticasone + azelastine nasal spray combination) 1 spray per nostril twice a day. ? This replaces all of your other nasal sprays for now. If it's not covered let us know.   Nasal saline spray (i.e., Simply Saline) or nasal saline lavage (i.e., NeilMed) is recommended as needed and prior to medicated nasal sprays.  May use olopatadine eye drops 0.2% once a day as needed for itchy/watery eyes.  Read about allergy injections.   Other atopic dermatitis Past history - Breaking out on her arms, neck and under her armpits.  Using Vaseline and some type of steroid cream prescribed by her dermatologist with some benefit.  Interim history - deodorants seem to flare her symptoms.  Continue proper skin care.  Recommend patch testing if eczema flares up.   May use desonide ointment twice a day for eczema flares on the neck. Do not use more than 2 weeks in a  row.  Adverse food reaction Past history - Almonds causes perioral pruritus and hives.  Tolerates peanuts cashews and pecans with no issues. 2021 skin testing was negative to foods including  almonds.  Continue to avoid almonds for now.  For mild symptoms you can take over the counter antihistamines such as Benadryl and monitor symptoms closely. If symptoms worsen or if you have severe symptoms including breathing issues, throat closure, significant swelling, whole body hives, severe diarrhea and vomiting, lightheadedness then inject epinephrine and seek immediate medical care afterwards.  Food action plan in place.   Return in about 2 months (around 05/22/2020).  Assessment and Plan: Kimberly Ali is a 44 y.o. female with: No problem-specific Assessment & Plan notes found for this encounter.  No follow-ups on file.  No orders of the defined types were placed in this encounter.  Lab Orders  No laboratory test(s) ordered today    Diagnostics: Spirometry:  Tracings reviewed. Her effort: {Blank single:19197::"Good reproducible efforts.","It was hard to get consistent efforts and there is a question as to whether this reflects a maximal maneuver.","Poor effort, data can not be interpreted."} FVC: ***L FEV1: ***L, ***% predicted FEV1/FVC ratio: ***% Interpretation: {Blank single:19197::"Spirometry consistent with mild obstructive disease","Spirometry consistent with moderate obstructive disease","Spirometry consistent with severe obstructive disease","Spirometry consistent with possible restrictive disease","Spirometry consistent with mixed obstructive and restrictive disease","Spirometry uninterpretable due to technique","Spirometry consistent with normal pattern","No overt abnormalities noted given today's efforts"}.  Please see scanned spirometry results for details.  Skin Testing: {Blank single:19197::"Select foods","Environmental allergy panel","Environmental allergy panel and select foods","Food allergy panel","None","Deferred due to recent antihistamines use"}. Positive test to: ***. Negative test to: ***.  Results discussed with patient/family.   Medication List:  Current  Outpatient Medications  Medication Sig Dispense Refill  . albuterol (VENTOLIN HFA) 108 (90 Base) MCG/ACT inhaler Inhale 1-2 puffs into the lungs every 6 (six) hours as needed for wheezing or shortness of breath. 18 g 1  . amLODipine (NORVASC) 5 MG tablet Take 1 tablet (5 mg total) by mouth daily. 30 tablet 3  . Azelastine-Fluticasone 137-50 MCG/ACT SUSP Place 1 spray into the nose in the morning and at bedtime. 23 g 5  . desonide (DESOWEN) 0.05 % ointment Apply 1 application topically 2 (two) times daily. Use twice a day if needed on eczema flares. Stop when it clears up. 60 g 1  . EPINEPHrine 0.3 mg/0.3 mL IJ SOAJ injection Inject 0.3 mLs (0.3 mg total) into the muscle as needed for anaphylaxis. 2 each 2  . FLOVENT HFA 110 MCG/ACT inhaler SMARTSIG:2 Puff(s) By Mouth Twice Daily    . fluticasone furoate-vilanterol (BREO ELLIPTA) 200-25 MCG/INH AEPB TAKE 1 PUFF BY MOUTH EVERY DAY 60 each 2  . levocetirizine (XYZAL) 5 MG tablet TAKE 1 TABLET BY MOUTH EVERY DAY IN THE EVENING 30 tablet 2  . montelukast (SINGULAIR) 10 MG tablet Take 1 tablet (10 mg total) by mouth at bedtime. 30 tablet 5  . naproxen sodium (ALEVE) 220 MG tablet Take 440 mg by mouth as needed (pain).    . Olopatadine HCl 0.2 % SOLN Apply 1 drop to eye daily as needed (itchy/watery eyes). 2.5 mL 5  . Spacer/Aero-Hold Chamber Mask MISC Use with inhaler 1 each 0   No current facility-administered medications for this visit.   Allergies: Allergies  Allergen Reactions  . Almond (Diagnostic) Itching and Swelling   I reviewed her past medical history, social history, family history, and environmental history and no significant changes have been reported from her previous  visit.  Review of Systems  Constitutional: Negative for appetite change, chills, fever and unexpected weight change.  HENT: Positive for congestion and rhinorrhea. Negative for sneezing.   Eyes: Negative for itching.  Respiratory: Negative for cough, chest tightness,  shortness of breath and wheezing.   Cardiovascular: Negative for chest pain.  Gastrointestinal: Negative for abdominal pain.  Genitourinary: Negative for difficulty urinating.  Skin: Positive for rash.  Allergic/Immunologic: Positive for environmental allergies.  Neurological: Negative for headaches.   Objective: There were no vitals taken for this visit. There is no height or weight on file to calculate BMI. Physical Exam Vitals and nursing note reviewed.  Constitutional:      Appearance: Normal appearance. She is well-developed.  HENT:     Head: Normocephalic and atraumatic.     Right Ear: Tympanic membrane and external ear normal.     Left Ear: Tympanic membrane and external ear normal.     Nose: Rhinorrhea present.     Mouth/Throat:     Mouth: Mucous membranes are moist.     Pharynx: Oropharynx is clear.  Eyes:     Conjunctiva/sclera: Conjunctivae normal.  Cardiovascular:     Rate and Rhythm: Normal rate and regular rhythm.     Heart sounds: Normal heart sounds. No murmur heard.  No friction rub. No gallop.   Pulmonary:     Effort: Pulmonary effort is normal.     Breath sounds: Normal breath sounds. No wheezing, rhonchi or rales.  Musculoskeletal:     Cervical back: Neck supple.  Skin:    General: Skin is warm.     Findings: No rash.  Neurological:     Mental Status: She is alert and oriented to person, place, and time.  Psychiatric:        Mood and Affect: Mood normal.        Behavior: Behavior normal.    Previous notes and tests were reviewed. The plan was reviewed with the patient/family, and all questions/concerned were addressed.  It was my pleasure to see Kimberly Ali today and participate in her care. Please feel free to contact me with any questions or concerns.  Sincerely,  Rexene Alberts, DO Allergy & Immunology  [facility] office: [place] office: [phone]

## 2020-05-25 ENCOUNTER — Ambulatory Visit: Payer: BC Managed Care – PPO | Admitting: Allergy

## 2020-05-29 NOTE — Progress Notes (Deleted)
Follow Up Note  RE: Indyah Ali MRN: [id number] DOB: Jan 15, 1976 Date of Office Visit: 05/30/2020  Referring provider: Shelda Pal* Primary care provider: Shelda Pal, DO  Chief Complaint: No chief complaint on file.  History of Present Illness: I had the pleasure of seeing Kimberly Ali for a follow up visit at the [facility] on 05/29/2020. She is a 44 y.o. female, who is being followed for asthma, allergic rhinoconjunctivitis, atopic dermatitis and adverse food reaction. Her previous allergy office visit was on 03/21/2020 with Dr. Maudie Mercury. Today is a regular follow up visit.  Based your bloodwork results, you meet criteria for all of the biologics (injectables) for asthma.  Let me know which one you would prefer. If you want to discuss more in detail, please schedule an appointment or we can discuss at your next visit in September.   1. Xolair injections - 3 injections every 2 weeks. This focuses against IgE. 2. Nucala injection - 1 injection every 4 weeks. This medication focuses against eosinophils. 3. Fasenra injection - 1 injection every 8 weeks (there's an initial build up dosing of 1 injection every 4 weeks for the first 3 doses.  Your food panel was positive to hazelnut, walnut, peanut, macadamia nut, almond.  Please avoid the above foods for now.  Okay to eat cashews, pistachios, pecans as you were before.  Severe persistent asthma without complication Past history - Diagnosed with asthma over 30 years ago.  Recently had an ER visit on April 1st after losing consciousness while receiving a nebulizer treatment.  Main triggers include infections and allergies.  Started on Breo 201 puff once a day for the last month and noticing improvement. Patient was hospitalized in 2015 in the ICU for asthma exacerbation.  No previous asthma Biologics.  2021 spirometry showed some mild obstruction however patient just used her inhaler prior  to the appointment.  No improvement in FEV1 post bronchodilator treatment. Interim history - got bloodwork 2 days ago. Doing much better with below regimen.   Will message you once I have the bloodwork results regarding starting biologics.   Daily controller medication(s):continue Breo 240mcg 1 puff once a day and rinse mouth after each use. ? Continue with Singulair 10mg  daily.  Prior to physical activity:May use albuterol rescue inhaler 2 puffs 5 to 15 minutes prior to strenuous physical activities.  Rescue medications:May use albuterol rescue inhaler 2 puffs or nebulizer every 4 to 6 hours as needed for shortness of breath, chest tightness, coughing, and wheezing. Monitor frequency of use.   During upper respiratory infections/asthma flares: Start Flovent 2 puffs twice a day with spacer and rinse mouth afterwards for 1-2 weeks.   Seasonal and perennial allergic rhinoconjunctivitis Past history - Perennial rhinoconjunctivitis symptoms in the last 4 years with worsening in the spring and fall.  Tried Zyrtec, Allegra, Singulair, Flonase, azelastine and Xyzal with some benefit. Questionable nasal polyps as a child. 2021 skin testing showed: Positive to grass, ragweed, weed, trees, dust mites, cat, dog, horse, mold, cockroach. Interim history - unable to start dymista yet due to insurance issues. Still having rhinitis symptoms.  Continue environmental control measures as below.  May use over the counter antihistamines such as Zyrtec (cetirizine), Claritin (loratadine), Allegra (fexofenadine), or Xyzal (levocetirizine) daily as needed. May take twice a day if needed.   Start dymista (fluticasone + azelastine nasal spray combination) 1 spray per nostril twice a day. ? This replaces all of your other nasal sprays  for now. If it's not covered let us know.   Nasal saline spray (i.e., Simply Saline) or nasal saline lavage (i.e., NeilMed) is recommended as needed and prior to medicated nasal  sprays.  May use olopatadine eye drops 0.2% once a day as needed for itchy/watery eyes.  Read about allergy injections.   Other atopic dermatitis Past history - Breaking out on her arms, neck and under her armpits.  Using Vaseline and some type of steroid cream prescribed by her dermatologist with some benefit.  Interim history - deodorants seem to flare her symptoms.  Continue proper skin care.  Recommend patch testing if eczema flares up.   May use desonide ointment twice a day for eczema flares on the neck. Do not use more than 2 weeks in a row.  Adverse food reaction Past history - Almonds causes perioral pruritus and hives.  Tolerates peanuts cashews and pecans with no issues. 2021 skin testing was negative to foods including almonds.  Continue to avoid almonds for now.  For mild symptoms you can take over the counter antihistamines such as Benadryl and monitor symptoms closely. If symptoms worsen or if you have severe symptoms including breathing issues, throat closure, significant swelling, whole body hives, severe diarrhea and vomiting, lightheadedness then inject epinephrine and seek immediate medical care afterwards.  Food action plan in place.   Return in about 2 months (around 05/22/2020).  Assessment and Plan: Kimberly Ali is a 44 y.o. female with: No problem-specific Assessment & Plan notes found for this encounter.  No follow-ups on file.  No orders of the defined types were placed in this encounter.  Lab Orders  No laboratory test(s) ordered today    Diagnostics: Spirometry:  Tracings reviewed. Her effort: {Blank single:19197::"Good reproducible efforts.","It was hard to get consistent efforts and there is a question as to whether this reflects a maximal maneuver.","Poor effort, data can not be interpreted."} FVC: ***L FEV1: ***L, ***% predicted FEV1/FVC ratio: ***% Interpretation: {Blank single:19197::"Spirometry consistent with mild obstructive  disease","Spirometry consistent with moderate obstructive disease","Spirometry consistent with severe obstructive disease","Spirometry consistent with possible restrictive disease","Spirometry consistent with mixed obstructive and restrictive disease","Spirometry uninterpretable due to technique","Spirometry consistent with normal pattern","No overt abnormalities noted given today's efforts"}.  Please see scanned spirometry results for details.  Skin Testing: {Blank single:19197::"Select foods","Environmental allergy panel","Environmental allergy panel and select foods","Food allergy panel","None","Deferred due to recent antihistamines use"}. Positive test to: ***. Negative test to: ***.  Results discussed with patient/family.   Medication List:  Current Outpatient Medications  Medication Sig Dispense Refill  . albuterol (VENTOLIN HFA) 108 (90 Base) MCG/ACT inhaler Inhale 1-2 puffs into the lungs every 6 (six) hours as needed for wheezing or shortness of breath. 18 g 1  . amLODipine (NORVASC) 5 MG tablet Take 1 tablet (5 mg total) by mouth daily. 30 tablet 3  . Azelastine-Fluticasone 137-50 MCG/ACT SUSP Place 1 spray into the nose in the morning and at bedtime. 23 g 5  . desonide (DESOWEN) 0.05 % ointment Apply 1 application topically 2 (two) times daily. Use twice a day if needed on eczema flares. Stop when it clears up. 60 g 1  . EPINEPHrine 0.3 mg/0.3 mL IJ SOAJ injection Inject 0.3 mLs (0.3 mg total) into the muscle as needed for anaphylaxis. 2 each 2  . FLOVENT HFA 110 MCG/ACT inhaler SMARTSIG:2 Puff(s) By Mouth Twice Daily    . fluticasone furoate-vilanterol (BREO ELLIPTA) 200-25 MCG/INH AEPB TAKE 1 PUFF BY MOUTH EVERY DAY 60 each 2  . levocetirizine (XYZAL)  5 MG tablet TAKE 1 TABLET BY MOUTH EVERY DAY IN THE EVENING 30 tablet 2  . montelukast (SINGULAIR) 10 MG tablet Take 1 tablet (10 mg total) by mouth at bedtime. 30 tablet 5  . naproxen sodium (ALEVE) 220 MG tablet Take 440 mg by mouth  as needed (pain).    . Olopatadine HCl 0.2 % SOLN Apply 1 drop to eye daily as needed (itchy/watery eyes). 2.5 mL 5  . Spacer/Aero-Hold Chamber Mask MISC Use with inhaler 1 each 0   No current facility-administered medications for this visit.   Allergies: Allergies  Allergen Reactions  . Almond (Diagnostic) Itching and Swelling   I reviewed her past medical history, social history, family history, and environmental history and no significant changes have been reported from her previous visit.  Review of Systems  Constitutional: Negative for appetite change, chills, fever and unexpected weight change.  HENT: Positive for congestion and rhinorrhea. Negative for sneezing.   Eyes: Negative for itching.  Respiratory: Negative for cough, chest tightness, shortness of breath and wheezing.   Cardiovascular: Negative for chest pain.  Gastrointestinal: Negative for abdominal pain.  Genitourinary: Negative for difficulty urinating.  Skin: Positive for rash.  Allergic/Immunologic: Positive for environmental allergies.  Neurological: Negative for headaches.   Objective: There were no vitals taken for this visit. There is no height or weight on file to calculate BMI. Physical Exam Vitals and nursing note reviewed.  Constitutional:      Appearance: Normal appearance. She is well-developed.  HENT:     Head: Normocephalic and atraumatic.     Right Ear: Tympanic membrane and external ear normal.     Left Ear: Tympanic membrane and external ear normal.     Nose: Rhinorrhea present.     Mouth/Throat:     Mouth: Mucous membranes are moist.     Pharynx: Oropharynx is clear.  Eyes:     Conjunctiva/sclera: Conjunctivae normal.  Cardiovascular:     Rate and Rhythm: Normal rate and regular rhythm.     Heart sounds: Normal heart sounds. No murmur heard.  No friction rub. No gallop.   Pulmonary:     Effort: Pulmonary effort is normal.     Breath sounds: Normal breath sounds. No wheezing, rhonchi  or rales.  Musculoskeletal:     Cervical back: Neck supple.  Skin:    General: Skin is warm.     Findings: No rash.  Neurological:     Mental Status: She is alert and oriented to person, place, and time.  Psychiatric:        Mood and Affect: Mood normal.        Behavior: Behavior normal.    Previous notes and tests were reviewed. The plan was reviewed with the patient/family, and all questions/concerned were addressed.  It was my pleasure to see Kimberly Ali today and participate in her care. Please feel free to contact me with any questions or concerns.  Sincerely,  Rexene Alberts, DO Allergy & Immunology  [facility] office: [place] office: [phone]

## 2020-05-30 ENCOUNTER — Ambulatory Visit: Payer: BC Managed Care – PPO | Admitting: Allergy

## 2020-06-01 ENCOUNTER — Encounter: Payer: Self-pay | Admitting: Pulmonary Disease

## 2020-06-01 ENCOUNTER — Ambulatory Visit (INDEPENDENT_AMBULATORY_CARE_PROVIDER_SITE_OTHER): Payer: BC Managed Care – PPO | Admitting: Pulmonary Disease

## 2020-06-01 ENCOUNTER — Other Ambulatory Visit: Payer: Self-pay

## 2020-06-01 VITALS — BP 120/70 | HR 80 | Temp 97.7°F | Ht 65.0 in | Wt 159.4 lb

## 2020-06-01 DIAGNOSIS — G4733 Obstructive sleep apnea (adult) (pediatric): Secondary | ICD-10-CM | POA: Diagnosis not present

## 2020-06-01 NOTE — Patient Instructions (Signed)
Moderate probability of significant obstructive sleep apnea  We will schedule you for home sleep study Update your results  Options of treatment as we discussed  We will follow-up with you in about 3 months  Call with any significant concerns Sleep Apnea Sleep apnea affects breathing during sleep. It causes breathing to stop for a short time or to become shallow. It can also increase the risk of:  Heart attack.  Stroke.  Being very overweight (obese).  Diabetes.  Heart failure.  Irregular heartbeat. The goal of treatment is to help you breathe normally again. What are the causes? There are three kinds of sleep apnea:  Obstructive sleep apnea. This is caused by a blocked or collapsed airway.  Central sleep apnea. This happens when the brain does not send the right signals to the muscles that control breathing.  Mixed sleep apnea. This is a combination of obstructive and central sleep apnea. The most common cause of this condition is a collapsed or blocked airway. This can happen if:  Your throat muscles are too relaxed.  Your tongue and tonsils are too large.  You are overweight.  Your airway is too small. What increases the risk?  Being overweight.  Smoking.  Having a small airway.  Being older.  Being female.  Drinking alcohol.  Taking medicines to calm yourself (sedatives or tranquilizers).  Having family members with the condition. What are the signs or symptoms?  Trouble staying asleep.  Being sleepy or tired during the day.  Getting angry a lot.  Loud snoring.  Headaches in the morning.  Not being able to focus your mind (concentrate).  Forgetting things.  Less interest in sex.  Mood swings.  Personality changes.  Feelings of sadness (depression).  Waking up a lot during the night to pee (urinate).  Dry mouth.  Sore throat. How is this diagnosed?  Your medical history.  A physical exam.  A test that is done when you are  sleeping (sleep study). The test is most often done in a sleep lab but may also be done at home. How is this treated?   Sleeping on your side.  Using a medicine to get rid of mucus in your nose (decongestant).  Avoiding the use of alcohol, medicines to help you relax, or certain pain medicines (narcotics).  Losing weight, if needed.  Changing your diet.  Not smoking.  Using a machine to open your airway while you sleep, such as: ? An oral appliance. This is a mouthpiece that shifts your lower jaw forward. ? A CPAP device. This device blows air through a mask when you breathe out (exhale). ? An EPAP device. This has valves that you put in each nostril. ? A BPAP device. This device blows air through a mask when you breathe in (inhale) and breathe out.  Having surgery if other treatments do not work. It is important to get treatment for sleep apnea. Without treatment, it can lead to:  High blood pressure.  Coronary artery disease.  In men, not being able to have an erection (impotence).  Reduced thinking ability. Follow these instructions at home: Lifestyle  Make changes that your doctor recommends.  Eat a healthy diet.  Lose weight if needed.  Avoid alcohol, medicines to help you relax, and some pain medicines.  Do not use any products that contain nicotine or tobacco, such as cigarettes, e-cigarettes, and chewing tobacco. If you need help quitting, ask your doctor. General instructions  Take over-the-counter and prescription medicines only  as told by your doctor.  If you were given a machine to use while you sleep, use it only as told by your doctor.  If you are having surgery, make sure to tell your doctor you have sleep apnea. You may need to bring your device with you.  Keep all follow-up visits as told by your doctor. This is important. Contact a doctor if:  The machine that you were given to use during sleep bothers you or does not seem to be working.  You  do not get better.  You get worse. Get help right away if:  Your chest hurts.  You have trouble breathing in enough air.  You have an uncomfortable feeling in your back, arms, or stomach.  You have trouble talking.  One side of your body feels weak.  A part of your face is hanging down. These symptoms may be an emergency. Do not wait to see if the symptoms will go away. Get medical help right away. Call your local emergency services (911 in the [place] Do not drive yourself to the hospital. Summary  This condition affects breathing during sleep.  The most common cause is a collapsed or blocked airway.  The goal of treatment is to help you breathe normally while you sleep. This information is not intended to replace advice given to you by your health care provider. Make sure you discuss any questions you have with your health care provider. Document Revised: 05/29/2018 Document Reviewed: 04/07/2018 Elsevier Patient Education  [employer].

## 2020-06-01 NOTE — Progress Notes (Signed)
Kimberly Ali    [id number]    28-Apr-1976  Primary Care Physician:Wendling, Crosby Oyster, DO  Referring Physician: Shelda Pal, [address] [place],  [place] [address]  Chief complaint:   Patient being seen for snoring, witnessed apneas Referred by primary secondary to hypertension  HPI:  Witnessed apneas, snoring, snoring has been for many years Told by the daughter about snoring Usually goes to bed between 9 PM and 11 PM Usually falls right asleep 1-2 awakenings Final wake up time between 530 and 6 AM Weight is up about 8-10 pounds  Sleep is nonrestorative Admits to dryness of the mouth Occasional headaches Memory is good No sweating at night No family history of obstructive sleep apnea  Reformed smoker, quit over 17 years ago   Outpatient Encounter Medications as of 06/01/2020  Medication Sig  . albuterol (VENTOLIN HFA) 108 (90 Base) MCG/ACT inhaler Inhale 1-2 puffs into the lungs every 6 (six) hours as needed for wheezing or shortness of breath.  Marland Kitchen amLODipine (NORVASC) 5 MG tablet Take 1 tablet (5 mg total) by mouth daily.  . Azelastine-Fluticasone 137-50 MCG/ACT SUSP Place 1 spray into the nose in the morning and at bedtime.  Marland Kitchen desonide (DESOWEN) 0.05 % ointment Apply 1 application topically 2 (two) times daily. Use twice a day if needed on eczema flares. Stop when it clears up.  Marland Kitchen EPINEPHrine 0.3 mg/0.3 mL IJ SOAJ injection Inject 0.3 mLs (0.3 mg total) into the muscle as needed for anaphylaxis.  Marland Kitchen FLOVENT HFA 110 MCG/ACT inhaler SMARTSIG:2 Puff(s) By Mouth Twice Daily  . fluticasone furoate-vilanterol (BREO ELLIPTA) 200-25 MCG/INH AEPB TAKE 1 PUFF BY MOUTH EVERY DAY  . levocetirizine (XYZAL) 5 MG tablet Take 5 mg by mouth every evening.  . montelukast (SINGULAIR) 10 MG tablet Take 1 tablet (10 mg total) by mouth at bedtime.  . naproxen sodium (ALEVE) 220 MG tablet Take 440 mg by mouth as needed (pain).  .  Olopatadine HCl 0.2 % SOLN Apply 1 drop to eye daily as needed (itchy/watery eyes).  Marland Kitchen Spacer/Aero-Hold Chamber Mask MISC Use with inhaler  . [DISCONTINUED] levocetirizine (XYZAL) 5 MG tablet TAKE 1 TABLET BY MOUTH EVERY DAY IN THE EVENING   No facility-administered encounter medications on file as of 06/01/2020.    Allergies as of 06/01/2020 - Review Complete 06/01/2020  Allergen Reaction Noted  . Almond (diagnostic) Itching and Swelling 11/25/2019  . Dust mite extract  06/01/2020    Past Medical History:  Diagnosis Date  . Anal skin tag   . Eczema   . Headache   . History of hay fever   . History of migraine   . Moderate persistent asthma 12/20/2016  . Urticaria     Past Surgical History:  Procedure Laterality Date  . ABDOMINAL HYSTERECTOMY     Fibroids  . CERVIX SURGERY     x2  . DILATION AND CURETTAGE OF UTERUS    . EXCISION OF SKIN TAG N/A 07/23/2017   Procedure: EXCISION OF SKIN TAG;  Surgeon: Clovis Riley, MD;  Location: [facility];  Service: General;  Laterality: N/A;    Family History  Problem Relation Age of Onset  . Cancer Father        prostate  . Hypertension Father   . Diabetes Father   . Allergic rhinitis Father   . Alcohol abuse Maternal Grandmother   . Alcohol abuse Maternal Grandfather   .  Asthma Neg Hx   . Urticaria Neg Hx   . Eczema Neg Hx     Social History   Socioeconomic History  . Marital status: Single    Spouse name: Not on file  . Number of children: 4  . Years of education: college  . Highest education level: Not on file  Occupational History  . Not on file  Tobacco Use  . Smoking status: Former Research scientist (life sciences)  . Smokeless tobacco: Never Used  . Tobacco comment: > 16 yrs ago  Vaping Use  . Vaping Use: Never used  Substance and Sexual Activity  . Alcohol use: Yes    Comment: social  . Drug use: No  . Sexual activity: Not on file  Other Topics Concern  . Not on file  Social History Narrative   Lives with  family   Social Determinants of Health   Financial Resource Strain:   . Difficulty of Paying Living Expenses: Not on file  Food Insecurity:   . Worried About Charity fundraiser in the Last Year: Not on file  . Ran Out of Food in the Last Year: Not on file  Transportation Needs:   . Lack of Transportation (Medical): Not on file  . Lack of Transportation (Non-Medical): Not on file  Physical Activity:   . Days of Exercise per Week: Not on file  . Minutes of Exercise per Session: Not on file  Stress:   . Feeling of Stress : Not on file  Social Connections:   . Frequency of Communication with Friends and Family: Not on file  . Frequency of Social Gatherings with Friends and Family: Not on file  . Attends Religious Services: Not on file  . Active Member of Clubs or Organizations: Not on file  . Attends Archivist Meetings: Not on file  . Marital Status: Not on file  Intimate Partner Violence:   . Fear of Current or Ex-Partner: Not on file  . Emotionally Abused: Not on file  . Physically Abused: Not on file  . Sexually Abused: Not on file    Review of Systems  Respiratory: Positive for shortness of breath.   Psychiatric/Behavioral: Positive for sleep disturbance.    Vitals:   06/01/20 1623  BP: 120/70  Pulse: 80  Temp: 97.7 F (36.5 C)  SpO2: 98%     Physical Exam Constitutional:      Appearance: Normal appearance.  HENT:     Head: Normocephalic and atraumatic.     Mouth/Throat:     Comments: Mallampati 2, crowded oropharynx Eyes:     General:        Right eye: No discharge.        Left eye: No discharge.  Cardiovascular:     Rate and Rhythm: Normal rate and regular rhythm.     Heart sounds: No murmur heard.  No friction rub.  Pulmonary:     Effort: Pulmonary effort is normal. No respiratory distress.     Breath sounds: Normal breath sounds. No stridor. No wheezing or rhonchi.  Musculoskeletal:     Cervical back: No rigidity or tenderness.    Neurological:     Mental Status: She is alert.  Psychiatric:        Mood and Affect: Mood normal.    Results of the Epworth flowsheet 06/01/2020  Sitting and reading 1  Watching TV 3  Sitting, inactive in a public place (e.g. a theatre or a meeting) 0  As a passenger in a  car for an hour without a break 0  Lying down to rest in the afternoon when circumstances permit 3  Sitting and talking to someone 0  Sitting quietly after a lunch without alcohol 0  In a car, while stopped for a few minutes in traffic 0  Total score 7    Assessment:  Daytime sleepiness  Nonrestorative sleep  Witnessed apneas with significant snoring history  Moderate probability of significant obstructive sleep apnea  Pathophysiology of sleep disordered breathing discussed with the patient Treatment options for sleep disordered breathing discussed with patient  Plan/Recommendations: Risks with not treating sleep disordered breathing discussed  We will schedule the patient for home sleep study  Follow-up in 3 months  Encouraged to call with any significant concerns   Sherrilyn Rist MD [facility] 06/01/2020, 4:26 PM  CC: Shelda Pal*

## 2020-06-09 NOTE — Progress Notes (Signed)
   Covid-19 Vaccination Clinic  Name:  Kimberly Ali    MRN: [id number] DOB: [birth year]  06/09/2020  [name] was observed post Covid-19 immunization for 15 minutes without incident. She was provided with Vaccine Information Sheet and instruction to access the V-Safe system.   [name] was instructed to call 911 with any severe reactions post vaccine: Marland Kitchen Difficulty breathing  . Swelling of face and throat  . A fast heartbeat  . A bad rash all over body  . Dizziness and weakness   Immunizations Administered    Name Date Dose VIS Date Route   Pfizer COVID-19 Vaccine 05/18/2020  8:00 AM 0.3 mL 10/20/2018 Intramuscular   Manufacturer: [employer]   Lot: [id number]   [place]: [id number]

## 2020-06-26 ENCOUNTER — Other Ambulatory Visit: Payer: Self-pay | Admitting: Family Medicine

## 2020-06-26 DIAGNOSIS — I10 Essential (primary) hypertension: Secondary | ICD-10-CM

## 2020-07-05 ENCOUNTER — Other Ambulatory Visit: Payer: Self-pay

## 2020-07-05 ENCOUNTER — Ambulatory Visit: Payer: BC Managed Care – PPO

## 2020-07-05 DIAGNOSIS — G4733 Obstructive sleep apnea (adult) (pediatric): Secondary | ICD-10-CM

## 2020-07-10 ENCOUNTER — Ambulatory Visit (INDEPENDENT_AMBULATORY_CARE_PROVIDER_SITE_OTHER): Payer: BC Managed Care – PPO | Admitting: Family Medicine

## 2020-07-10 ENCOUNTER — Other Ambulatory Visit: Payer: Self-pay

## 2020-07-10 ENCOUNTER — Encounter: Payer: Self-pay | Admitting: Family Medicine

## 2020-07-10 ENCOUNTER — Telehealth: Payer: Self-pay | Admitting: Pulmonary Disease

## 2020-07-10 VITALS — BP 126/82 | HR 71 | Temp 98.0°F | Resp 12 | Ht 65.0 in | Wt 154.4 lb

## 2020-07-10 DIAGNOSIS — K644 Residual hemorrhoidal skin tags: Secondary | ICD-10-CM

## 2020-07-10 DIAGNOSIS — L245 Irritant contact dermatitis due to other chemical products: Secondary | ICD-10-CM

## 2020-07-10 DIAGNOSIS — G4733 Obstructive sleep apnea (adult) (pediatric): Secondary | ICD-10-CM | POA: Diagnosis not present

## 2020-07-10 MED ORDER — HYDROCORTISONE 2.5 % EX CREA: TOPICAL_CREAM | Freq: Two times a day (BID) | CUTANEOUS | 0 refills | Status: DC

## 2020-07-10 NOTE — Patient Instructions (Signed)
Try not to itch.  Make sure your bowels are normal.   Stay hydrated.  Avoid scented products.   Ice/cold can be helpful.   Let me know if things aren't turning the corner.   Let us know if you need anything.

## 2020-07-10 NOTE — Telephone Encounter (Signed)
Called and spoke with pt letting her know the info stated by AO about HST results. Pt said that she wants to have OV to have results further discussed. Pt has an appt scheduled with Brian 11/22 and stated to pt that he can further discuss results during this OV. Nothing further needed.

## 2020-07-10 NOTE — Progress Notes (Signed)
Chief Complaint  Patient presents with  . Vaginal Itching  . Anal Itching    Kimberly Ali is a 44 y.o. female here for a skin complaint.  Duration: 1 week Location: vaginal region and bottom Pruritic? Yes Painful? No Drainage? No New soaps/lotions/topicals/detergents? Yes  Sick contacts? No Other associated symptoms: no bowel/bladder concerns, changed soaps  Therapies tried thus far: none  Past Medical History:  Diagnosis Date  . Anal skin tag   . Eczema   . Headache   . History of hay fever   . History of migraine   . Moderate persistent asthma 12/20/2016  . Urticaria     BP 126/82 (BP Location: Left Arm, Cuff Size: Normal)   Pulse 71   Temp 98 F (36.7 C) (Oral)   Resp 12   Ht 5\' 5"  (1.651 m)   Wt 154 lb 6.4 oz (70 kg)   SpO2 98%   BMI 25.69 kg/m  Gen: awake, alert, appearing stated age Lungs: No accessory muscle use Skin: Examined in presence of female chaperone, Kem Boroughs, CMA. No ext genital lesions. No drainage, erythema, TTP, fluctuance, excoriation  Rectal: There is a large ext hemorrhoid noted that is ttp and reproduces itching sensation; no excoriation or lesions noted Psych: Age appropriate judgment and insight  External hemorrhoid - Plan: hydrocortisone 2.5 % cream  Irritant contact dermatitis due to other chemical products - Plan: hydrocortisone 2.5 % cream  1. Stay hydrated, make sure bowels are reg. BID cream.  2. BID cream as new soap likely changed. I don't seen any lesions right now.  F/u prn. The patient voiced understanding and agreement to the plan.  [place], DO 07/10/20 1:38 PM

## 2020-07-10 NOTE — Telephone Encounter (Signed)
Call patient  Sleep study result  Date of study: 07/05/2020  Impression: Mild obstructive sleep apnea Mild oxygen desaturations  Recommendation: Options of treatment for mild obstructive sleep apnea will include CPAP therapy or an oral device  CPAP therapy may be considered if there is significant daytime symptoms that can be ascribed to sleep disordered breathing  CPAP treatment-CPAP 5-15 will be appropriate  An oral device will require referral to dentist to fashion an oral device  Encourage weight loss measures  Follow-up in 4 to 6 weeks

## 2020-07-17 ENCOUNTER — Encounter: Payer: Self-pay | Admitting: Pulmonary Disease

## 2020-07-17 ENCOUNTER — Ambulatory Visit: Payer: BC Managed Care – PPO | Admitting: Adult Health

## 2020-07-17 ENCOUNTER — Ambulatory Visit: Payer: BC Managed Care – PPO | Admitting: Pulmonary Disease

## 2020-07-17 ENCOUNTER — Other Ambulatory Visit: Payer: Self-pay

## 2020-07-17 VITALS — BP 120/82 | HR 82 | Temp 98.2°F | Ht 65.0 in | Wt 158.2 lb

## 2020-07-17 DIAGNOSIS — G4733 Obstructive sleep apnea (adult) (pediatric): Secondary | ICD-10-CM | POA: Insufficient documentation

## 2020-07-17 DIAGNOSIS — Z0000 Encounter for general adult medical examination without abnormal findings: Secondary | ICD-10-CM

## 2020-07-17 DIAGNOSIS — J4540 Moderate persistent asthma, uncomplicated: Secondary | ICD-10-CM | POA: Diagnosis not present

## 2020-07-17 NOTE — Progress Notes (Signed)
@Patient  ID: Kimberly Ali, female    DOB: 1975/12/18, 44 y.o.   MRN: [id number]  Chief Complaint  Patient presents with  . Follow-up    Recently did HST is here to discuss results and options    Referring provider: Shelda Pal*  HPI:  44 year old female former smoker followed in our office for mild obstructive sleep apnea.  Patient also established with allergy asthma for management of asthma and allergic rhinitis.  PMH: Migraine, multiple food allergies, atopic dermatitis Smoker/ Smoking History: Former smoker.  Quit 2004. Maintenance: Breo 200 Pt of: Dr. Jenetta Downer  07/17/2020  - Visit   44 year old female former smoker followed in our office for asthma as well as mild obstructive sleep apnea.  Patient recently completed sleep study this results are listed below:  07/05/2020-home sleep study-AHI 13, SaO2 low 90%  Patient reports that she does have daytime fatigue.  She also has a considerable amount of stress that she works in [employer] in [employer] and the pandemic has been tough.  She struggles with obtaining the recommended amount of sleep.  She also has baseline history of TMJ.  She does not believe that she would be a good candidate for the oral appliance.  I agree with her.  She is interested in starting CPAP therapy.  We will discuss this today.  Patient has received the COVID-19 vaccinations.  She does not receive the flu vaccine as she has had a reaction in the past of anaphylaxis from this.  She feels this may have been from her known egg allergy.  She is established with allergy asthma Dr. Maudie Mercury.  She remains adherent to [employer].   Questionaires / Pulmonary Flowsheets:   ACT:  No flowsheet data found.  MMRC: No flowsheet data found.  Epworth:  Results of the Epworth flowsheet 06/01/2020  Sitting and reading 1  Watching TV 3  Sitting, inactive in a public place (e.g. a theatre or a meeting) 0  As a passenger in a car for an hour without  a break 0  Lying down to rest in the afternoon when circumstances permit 3  Sitting and talking to someone 0  Sitting quietly after a lunch without alcohol 0  In a car, while stopped for a few minutes in traffic 0  Total score 7    Tests:   FENO:  No results found for: NITRICOXIDE  PFT: No flowsheet data found.  WALK:  No flowsheet data found.  Imaging: No results found.  Lab Results:  CBC    Component Value Date/Time   WBC 7.1 03/20/2020 0948   WBC 13.8 (H) 11/25/2019 0732   RBC 4.57 03/20/2020 0948   RBC 4.29 11/25/2019 0732   HGB 12.1 03/20/2020 0948   HCT 38.0 03/20/2020 0948   PLT 306 03/20/2020 0948   MCV 83 03/20/2020 0948   MCH 26.5 (L) 03/20/2020 0948   MCH 26.6 11/25/2019 0732   MCHC 31.8 03/20/2020 0948   MCHC 30.7 11/25/2019 0732   RDW 13.1 03/20/2020 0948   LYMPHSABS 1.9 03/20/2020 0948   EOSABS 0.5 (H) 03/20/2020 0948   BASOSABS 0.0 03/20/2020 0948    BMET    Component Value Date/Time   NA 137 11/25/2019 0732   K 4.0 11/25/2019 0732   CL 103 11/25/2019 0732   CO2 24 11/25/2019 0732   GLUCOSE 240 (H) 11/25/2019 0732   BUN 9 11/25/2019 0732   CREATININE 0.86 11/25/2019 0732   CALCIUM 8.7 (L) 11/25/2019 0732  GFRNONAA >60 11/25/2019 0732   GFRAA >60 11/25/2019 0732    BNP No results found for: BNP  ProBNP No results found for: PROBNP  Specialty Problems      Pulmonary Problems   Moderate persistent asthma   Severe persistent asthma without complication   Seasonal and perennial allergic rhinoconjunctivitis   OSA (obstructive sleep apnea)      Allergies  Allergen Reactions  . Eggs Or Egg-Derived Products Anaphylaxis  . Haemophilus Influenzae Vaccines Anaphylaxis  . Peanut-Containing Drug Products Anaphylaxis  . Almond (Diagnostic) Itching and Swelling  . Dust Mite Extract     Immunization History  Administered Date(s) Administered  . PFIZER SARS-COV-2 Vaccination 04/13/2020, 05/18/2020  . Tetanus 08/26/2013    Past  Medical History:  Diagnosis Date  . Anal skin tag   . Eczema   . Headache   . History of hay fever   . History of migraine   . Moderate persistent asthma 12/20/2016  . Urticaria     Tobacco History: Social History   Tobacco Use  Smoking Status Former Smoker  . Packs/day: 0.25  . Types: Cigarettes  . Quit date: 12/16/2002  . Years since quitting: 17.5  Smokeless Tobacco Never Used  Tobacco Comment   > 16 yrs ago, 1 pack would last her a week   Counseling given: Not Answered Comment: > 16 yrs ago, 1 pack would last her a week   Continue to not smoke  Outpatient Encounter Medications as of 07/17/2020  Medication Sig  . albuterol (VENTOLIN HFA) 108 (90 Base) MCG/ACT inhaler Inhale 1-2 puffs into the lungs every 6 (six) hours as needed for wheezing or shortness of breath.  Marland Kitchen amLODipine (NORVASC) 5 MG tablet TAKE 1 TABLET BY MOUTH EVERY DAY  . Azelastine-Fluticasone 137-50 MCG/ACT SUSP Place 1 spray into the nose in the morning and at bedtime.  Marland Kitchen desonide (DESOWEN) 0.05 % ointment Apply 1 application topically 2 (two) times daily. Use twice a day if needed on eczema flares. Stop when it clears up.  Marland Kitchen EPINEPHrine 0.3 mg/0.3 mL IJ SOAJ injection Inject 0.3 mLs (0.3 mg total) into the muscle as needed for anaphylaxis.  Marland Kitchen FLOVENT HFA 110 MCG/ACT inhaler SMARTSIG:2 Puff(s) By Mouth Twice Daily  . fluticasone furoate-vilanterol (BREO ELLIPTA) 200-25 MCG/INH AEPB TAKE 1 PUFF BY MOUTH EVERY DAY  . hydrocortisone 2.5 % cream Apply topically 2 (two) times daily.  Marland Kitchen levocetirizine (XYZAL) 5 MG tablet Take 5 mg by mouth every evening.  . montelukast (SINGULAIR) 10 MG tablet Take 1 tablet (10 mg total) by mouth at bedtime.  . naproxen sodium (ALEVE) 220 MG tablet Take 440 mg by mouth as needed (pain).  . Olopatadine HCl 0.2 % SOLN Apply 1 drop to eye daily as needed (itchy/watery eyes).  Marland Kitchen Spacer/Aero-Hold Chamber Mask MISC Use with inhaler   No facility-administered encounter medications on  file as of 07/17/2020.     Review of Systems  Review of Systems  Constitutional: Positive for fatigue. Negative for activity change and fever.  HENT: Negative for sinus pressure, sinus pain and sore throat.   Respiratory: Negative for cough, shortness of breath and wheezing.   Cardiovascular: Negative for chest pain and palpitations.  Gastrointestinal: Negative for diarrhea, nausea and vomiting.  Musculoskeletal: Negative for arthralgias.  Neurological: Negative for dizziness.  Psychiatric/Behavioral: Negative for sleep disturbance. The patient is not nervous/anxious.      Physical Exam  BP 120/82 (BP Location: Left Arm, Patient Position: Sitting, Cuff Size: Normal)  Pulse 82   Temp 98.2 F (36.8 C) (Temporal)   Ht 5\' 5"  (1.651 m)   Wt 158 lb 3.2 oz (71.8 kg)   SpO2 98%   BMI 26.33 kg/m   Wt Readings from Last 5 Encounters:  07/17/20 158 lb 3.2 oz (71.8 kg)  07/10/20 154 lb 6.4 oz (70 kg)  06/01/20 159 lb 6.4 oz (72.3 kg)  04/03/20 163 lb 2 oz (74 kg)  02/17/20 157 lb (71.2 kg)    BMI Readings from Last 5 Encounters:  07/17/20 26.33 kg/m  07/10/20 25.69 kg/m  06/01/20 26.53 kg/m  04/03/20 27.15 kg/m  02/17/20 25.84 kg/m     Physical Exam Vitals and nursing note reviewed.  Constitutional:      General: She is not in acute distress.    Appearance: Normal appearance. She is obese.  HENT:     Head: Normocephalic and atraumatic.     Right Ear: External ear normal.     Left Ear: External ear normal.     Nose: Nose normal. No congestion.     Mouth/Throat:     Mouth: Mucous membranes are moist.     Pharynx: Oropharynx is clear.     Comments: MP2  Eyes:     Pupils: Pupils are equal, round, and reactive to light.  Cardiovascular:     Rate and Rhythm: Normal rate and regular rhythm.     Pulses: Normal pulses.     Heart sounds: Normal heart sounds. No murmur heard.   Pulmonary:     Effort: Pulmonary effort is normal. No respiratory distress.     Breath  sounds: Normal breath sounds. No decreased air movement. No decreased breath sounds, wheezing or rales.  Musculoskeletal:     Cervical back: Normal range of motion.  Skin:    General: Skin is warm and dry.     Capillary Refill: Capillary refill takes less than 2 seconds.  Neurological:     General: No focal deficit present.     Mental Status: She is alert and oriented to person, place, and time. Mental status is at baseline.     Gait: Gait normal.  Psychiatric:        Mood and Affect: Mood normal.        Behavior: Behavior normal.        Thought Content: Thought content normal.        Judgment: Judgment normal.       Assessment & Plan:   Moderate persistent asthma Plan: Continue Breo Ellipta 200 Continue follow-up with allergy asthma Continue Singulair   OSA (obstructive sleep apnea) Mild obstructive sleep apnea on home sleep study Reviewed potential treatment options Patient with baseline TMJ, not a great candidate for oral appliance Patient would prefer to start CPAP therapy  Plan: Reviewed pathophysiology of obstructive sleep apnea Would recommend starting CPAP therapy Follow-up in 3 months to show 30-day compliance   Healthcare maintenance Does not receive the flu vaccine, previous history of anaphylaxis with the flu vaccine Potentially the anaphylaxis from the flu vaccine was actually due to her known egg allergy Patient has received the COVID-19 vaccinations without issue    Return in about 3 months (around 10/17/2020), or if symptoms worsen or fail to improve, for Follow up with Wyn Quaker FNP-C, Follow up with Dr. Ander Slade.   Lauraine Rinne, NP 07/17/2020   This appointment required 32 minutes of patient care (this includes precharting, chart review, review of results, face-to-face care, etc.).

## 2020-07-17 NOTE — Assessment & Plan Note (Signed)
Plan: Continue Breo Ellipta 200 Continue follow-up with allergy asthma Continue Singulair

## 2020-07-17 NOTE — Patient Instructions (Addendum)
You were seen today by Lauraine Rinne, NP  for:   1. OSA (obstructive sleep apnea)  New CPAP start [employer] [facility] APAP setting 5-15 Mask of choice  Please notify our office and no one has contacted you within the next 7 business days to get scheduled for CPAP set up.  We recommend that you start using your CPAP daily >>>Keep up the hard work using your device >>> Goal should be wearing this for the entire night that you are sleeping, at least 4 to 6 hours  Remember:  . Do not drive or operate heavy machinery if tired or drowsy.  . Please notify the supply company and office if you are unable to use your device regularly due to missing supplies or machine being broken.  . Work on maintaining a healthy weight and following your recommended nutrition plan  . Maintain proper daily exercise and movement  . Maintaining proper use of your device can also help improve management of other chronic illnesses such as: Blood pressure, blood sugars, and weight management.   BiPAP/ CPAP Cleaning:  >>>Clean weekly, with Dawn soap, and bottle brush.  Set up to air dry. >>> Wipe mask out daily with wet wipe or towelette  2. Moderate persistent asthma without complication  Keep follow-up with allergy asthma  Breo Ellipta 200 >>> Take 1 puff daily in the morning right when you wake up >>>Rinse your mouth out after use >>>This is a daily maintenance inhaler, NOT a rescue inhaler >>>Contact our office if you are having difficulties affording or obtaining this medication >>>It is important for you to be able to take this daily and not miss any doses   Continue Singulair  Only use your albuterol as a rescue medication to be used if you can't catch your breath by resting or doing a relaxed purse lip breathing pattern.  - The less you use it, the better it will work when you need it. - Ok to use up to 2 puffs  every 4 hours if you must but call for immediate appointment if use goes up over your usual need -  Don't leave home without it !!  (think of it like the spare tire for your car)   3. Healthcare maintenance  Up to date with Covid19   Follow Up:    Return in about 3 months (around 10/17/2020), or if symptoms worsen or fail to improve, for Follow up with Wyn Quaker FNP-C, Follow up with Dr. Ander Slade.   Notification of test results are managed in the following manner: If there are  any recommendations or changes to the  plan of care discussed in office today,  we will contact you and let you know what they are. If you do not hear from [place], then your results are normal and you can view them through your  MyChart account , or a letter will be sent to you. Thank you again for trusting [place] with your care  - Thank you, [facility]    It is flu season:   >>> Best ways to protect herself from the flu: Receive the yearly flu vaccine, practice good hand hygiene washing with soap and also using hand sanitizer when available, eat a nutritious meals, get adequate rest, hydrate appropriately       Please contact the office if your symptoms worsen or you have concerns that you are not improving.   Thank you for choosing [facility] for your healthcare, and for allowing [place]  to partner with you on your healthcare journey. I am thankful to be able to provide care to you today.   Wyn Quaker FNP-C   Sleep Apnea Sleep apnea affects breathing during sleep. It causes breathing to stop for a short time or to become shallow. It can also increase the risk of:  Heart attack.  Stroke.  Being very overweight (obese).  Diabetes.  Heart failure.  Irregular heartbeat. The goal of treatment is to help you breathe normally again. What are the causes? There are three kinds of sleep apnea:  Obstructive sleep apnea. This is caused by a blocked or collapsed airway.  Central sleep apnea. This happens when the brain does not send the right signals to the muscles that control  breathing.  Mixed sleep apnea. This is a combination of obstructive and central sleep apnea. The most common cause of this condition is a collapsed or blocked airway. This can happen if:  Your throat muscles are too relaxed.  Your tongue and tonsils are too large.  You are overweight.  Your airway is too small. What increases the risk?  Being overweight.  Smoking.  Having a small airway.  Being older.  Being female.  Drinking alcohol.  Taking medicines to calm yourself (sedatives or tranquilizers).  Having family members with the condition. What are the signs or symptoms?  Trouble staying asleep.  Being sleepy or tired during the day.  Getting angry a lot.  Loud snoring.  Headaches in the morning.  Not being able to focus your mind (concentrate).  Forgetting things.  Less interest in sex.  Mood swings.  Personality changes.  Feelings of sadness (depression).  Waking up a lot during the night to pee (urinate).  Dry mouth.  Sore throat. How is this diagnosed?  Your medical history.  A physical exam.  A test that is done when you are sleeping (sleep study). The test is most often done in a sleep lab but may also be done at home. How is this treated?   Sleeping on your side.  Using a medicine to get rid of mucus in your nose (decongestant).  Avoiding the use of alcohol, medicines to help you relax, or certain pain medicines (narcotics).  Losing weight, if needed.  Changing your diet.  Not smoking.  Using a machine to open your airway while you sleep, such as: ? An oral appliance. This is a mouthpiece that shifts your lower jaw forward. ? A CPAP device. This device blows air through a mask when you breathe out (exhale). ? An EPAP device. This has valves that you put in each nostril. ? A BPAP device. This device blows air through a mask when you breathe in (inhale) and breathe out.  Having surgery if other treatments do not work. It is  important to get treatment for sleep apnea. Without treatment, it can lead to:  High blood pressure.  Coronary artery disease.  In men, not being able to have an erection (impotence).  Reduced thinking ability. Follow these instructions at home: Lifestyle  Make changes that your doctor recommends.  Eat a healthy diet.  Lose weight if needed.  Avoid alcohol, medicines to help you relax, and some pain medicines.  Do not use any products that contain nicotine or tobacco, such as cigarettes, e-cigarettes, and chewing tobacco. If you need help quitting, ask your doctor. General instructions  Take over-the-counter and prescription medicines only as told by your doctor.  If you were given a machine to use  while you sleep, use it only as told by your doctor.  If you are having surgery, make sure to tell your doctor you have sleep apnea. You may need to bring your device with you.  Keep all follow-up visits as told by your doctor. This is important. Contact a doctor if:  The machine that you were given to use during sleep bothers you or does not seem to be working.  You do not get better.  You get worse. Get help right away if:  Your chest hurts.  You have trouble breathing in enough air.  You have an uncomfortable feeling in your back, arms, or stomach.  You have trouble talking.  One side of your body feels weak.  A part of your face is hanging down. These symptoms may be an emergency. Do not wait to see if the symptoms will go away. Get medical help right away. Call your local emergency services (911 in the [place] Do not drive yourself to the hospital. Summary  This condition affects breathing during sleep.  The most common cause is a collapsed or blocked airway.  The goal of treatment is to help you breathe normally while you sleep. This information is not intended to replace advice given to you by your health care provider. Make sure you discuss any questions you  have with your health care provider. Document Revised: 05/29/2018 Document Reviewed: 04/07/2018 Elsevier Patient Education  [place].

## 2020-07-17 NOTE — Assessment & Plan Note (Signed)
Mild obstructive sleep apnea on home sleep study Reviewed potential treatment options Patient with baseline TMJ, not a great candidate for oral appliance Patient would prefer to start CPAP therapy  Plan: Reviewed pathophysiology of obstructive sleep apnea Would recommend starting CPAP therapy Follow-up in 3 months to show 30-day compliance

## 2020-07-17 NOTE — Assessment & Plan Note (Signed)
Does not receive the flu vaccine, previous history of anaphylaxis with the flu vaccine Potentially the anaphylaxis from the flu vaccine was actually due to her known egg allergy Patient has received the COVID-19 vaccinations without issue

## 2020-07-27 ENCOUNTER — Other Ambulatory Visit: Payer: Self-pay | Admitting: Family Medicine

## 2020-07-27 DIAGNOSIS — J302 Other seasonal allergic rhinitis: Secondary | ICD-10-CM

## 2020-08-09 ENCOUNTER — Telehealth: Payer: Self-pay | Admitting: Pulmonary Disease

## 2020-08-09 NOTE — Telephone Encounter (Signed)
Called spoke with patient  Let her know there is was no appointment for her follow up , that there was a back order for machines, and to call us when she received her machine.  She voiced understanding.  I have also sent a community message to [facility] regarding the CPAP machine delay.

## 2020-08-22 ENCOUNTER — Other Ambulatory Visit: Payer: Self-pay | Admitting: Family Medicine

## 2020-08-22 DIAGNOSIS — J4540 Moderate persistent asthma, uncomplicated: Secondary | ICD-10-CM

## 2020-08-24 ENCOUNTER — Encounter: Payer: BC Managed Care – PPO | Admitting: Obstetrics & Gynecology

## 2020-08-30 ENCOUNTER — Telehealth: Payer: Self-pay | Admitting: Family Medicine

## 2020-08-30 DIAGNOSIS — J4540 Moderate persistent asthma, uncomplicated: Secondary | ICD-10-CM

## 2020-08-30 MED ORDER — BREO ELLIPTA 200-25 MCG/INH IN AEPB: INHALATION_SPRAY | RESPIRATORY_TRACT | 2 refills | Status: DC

## 2020-08-30 NOTE — Telephone Encounter (Signed)
Medication: fluticasone furoate-vilanterol (BREO ELLIPTA) 200-25 MCG/INH AEPB [[id number]]       Has the patient contacted their pharmacy? NO (If no, request that the patient contact the pharmacy for the refill.) (If yes, when and what did the pharmacy advise?)    Preferred Pharmacy (with phone number or street name) [id number] [place], [place] - [address] AT [facility] [address]  [address] [place], [place] [address]  Phone:  [phone] Fax:  [phone]      Agent: Please be advised that RX refills may take up to 3 business days. We ask that you follow-up with your pharmacy.

## 2020-08-30 NOTE — Telephone Encounter (Signed)
Refill done.  

## 2020-08-30 NOTE — Telephone Encounter (Signed)
Patient states she only have a one month supply she need a 3 month supply

## 2020-09-01 MED ORDER — BREO ELLIPTA 200-25 MCG/INH IN AEPB: INHALATION_SPRAY | RESPIRATORY_TRACT | 2 refills | Status: DC

## 2020-09-01 NOTE — Telephone Encounter (Signed)
Refilled for 90 days.

## 2020-09-01 NOTE — Telephone Encounter (Signed)
Patient would like a 90 days supply. Patient is out of medication

## 2020-09-20 ENCOUNTER — Other Ambulatory Visit (HOSPITAL_COMMUNITY)
Admission: RE | Admit: 2020-09-20 | Discharge: 2020-09-20 | Disposition: A | Discharge status: Home / Self Care | Payer: BC Managed Care – PPO | Source: Ambulatory Visit | Attending: Obstetrics & Gynecology | Admitting: Obstetrics & Gynecology

## 2020-09-20 ENCOUNTER — Encounter: Payer: Self-pay | Admitting: Obstetrics & Gynecology

## 2020-09-20 ENCOUNTER — Ambulatory Visit (INDEPENDENT_AMBULATORY_CARE_PROVIDER_SITE_OTHER): Payer: BC Managed Care – PPO | Admitting: Obstetrics & Gynecology

## 2020-09-20 ENCOUNTER — Other Ambulatory Visit: Payer: Self-pay

## 2020-09-20 VITALS — BP 160/103 | HR 80 | Ht 65.0 in | Wt 157.0 lb

## 2020-09-20 DIAGNOSIS — Z1211 Encounter for screening for malignant neoplasm of colon: Secondary | ICD-10-CM

## 2020-09-20 DIAGNOSIS — Z01419 Encounter for gynecological examination (general) (routine) without abnormal findings: Secondary | ICD-10-CM

## 2020-09-20 DIAGNOSIS — Z113 Encounter for screening for infections with a predominantly sexual mode of transmission: Secondary | ICD-10-CM | POA: Diagnosis not present

## 2020-09-20 NOTE — Progress Notes (Signed)
Subjective:     Kimberly Ali is a 45 y.o. female here for a routine exam. [id number] Current complaints: itching of mons pubis. No rash noted. Hydrocortisone helps. Pt is s/p lap hyst due to 23 week sized fibroids.      Gynecologic History No LMP recorded. Patient has had a hysterectomy. Contraception: status post hysterectomy Last Pap: prior to hyst Last mammogram: 01/25/2020. Results were: normal   Obstetric History OB History  Gravida Para Term Preterm AB Living  7 5 4 1 2 4   SAB IAB Ectopic Multiple Live Births  2 0 0 1 4    # Outcome Date GA Lbr Len/2nd Weight Sex Delivery Anes PTL Lv  7 SAB           6 SAB           5 Preterm           4 Term           3 Term           2 Term           1 Term            The following portions of the patient's history were reviewed and updated as appropriate: allergies, current medications, past family history, past medical history, past social history, past surgical history and problem list.  Review of Systems Pertinent items are noted in HPI.    Objective:  BP (!) 160/103   Pulse 80   Ht 5\' 5"  (1.651 m)   Wt 157 lb (71.2 kg)   BMI 26.13 kg/m  General Appearance:    Alert, cooperative, no distress, appears stated age  Head:    Normocephalic, without obvious abnormality, atraumatic  Eyes:    conjunctiva/corneas clear, EOM's intact, both eyes  Ears:    Normal external ear canals, both ears  Nose:   Nares normal, septum midline, mucosa normal, no drainage    or sinus tenderness  Throat:   Lips, mucosa, and tongue normal; teeth and gums normal  Neck:   Supple, symmetrical, trachea midline, no adenopathy;    thyroid:  no enlargement/tenderness/nodules  Back:     Symmetric, no curvature, ROM normal, no CVA tenderness  Lungs:     respirations unlabored  Chest Wall:    No tenderness or deformity   Heart:    Regular rate and rhythm  Breast Exam:    No tenderness, masses, or nipple abnormality  Abdomen:     Soft, non-tender, bowel  sounds active all four quadrants,    no masses, no organomegaly  Genitalia:    Normal female without lesion, discharge or tenderness   Vaginal cuff well healed.   Extremities:   Extremities normal, atraumatic, no cyanosis or edema  Pulses:   2+ and symmetric all extremities  Skin:   Skin color, texture, turgor normal, no rashes or lesions    Assessment:    Healthy female exam.   Colon cancer screen- referral for colonoscopy Breast cancer screen- f/u for mammogram in 01/2021   Plan:  Diagnoses and all orders for this visit:  Well female exam with routine gynecological exam -     GC/Chlamydia probe amp ([place])not at [facility]  Screening for STD (sexually transmitted disease) -     GC/Chlamydia probe amp ([place])not at [facility]  Screening for colon cancer -     Ambulatory referral to Gastroenterology  f/u in 1 year or sooner prn  [name],  M.D., [name]

## 2020-09-20 NOTE — Patient Instructions (Signed)
GO WHITE: Soap: UNSCENTED Dove (white box light green writing) Laundry detergent (underwear)- Dreft or [employer] unscented WHITE 100% cotton panties (NOT just cotton crouch) Sanitary napkin/panty liners: UNSCENTED.  If it doesn't SAY unscented it can have a scent/perfume    NO PERFUMES OR LOTIONS OR POTIONS in the vulvar area (may use regular [place]) Condoms: hypoallergenic only. Non dyed (no color) Toilet papers: white only Wash clothes: use a separate wash cloth. WHITE.  Washed in Dreft.  

## 2020-09-22 LAB — GC/CHLAMYDIA PROBE AMP (~~LOC~~) NOT AT ARMC
Chlamydia: NEGATIVE
Comment: NEGATIVE
Comment: NORMAL
Neisseria Gonorrhea: NEGATIVE

## 2020-09-27 ENCOUNTER — Ambulatory Visit (INDEPENDENT_AMBULATORY_CARE_PROVIDER_SITE_OTHER): Payer: BC Managed Care – PPO | Admitting: Family Medicine

## 2020-09-27 ENCOUNTER — Other Ambulatory Visit: Payer: Self-pay

## 2020-09-27 ENCOUNTER — Encounter: Payer: Self-pay | Admitting: Family Medicine

## 2020-09-27 VITALS — BP 128/78 | HR 98 | Temp 98.2°F | Ht 65.0 in | Wt 162.4 lb

## 2020-09-27 DIAGNOSIS — S46812A Strain of other muscles, fascia and tendons at shoulder and upper arm level, left arm, initial encounter: Secondary | ICD-10-CM

## 2020-09-27 DIAGNOSIS — M47812 Spondylosis without myelopathy or radiculopathy, cervical region: Secondary | ICD-10-CM

## 2020-09-27 MED ORDER — DESONIDE 0.05 % EX OINT: 1.0000 "application " | TOPICAL_OINTMENT | Freq: Two times a day (BID) | CUTANEOUS | 1 refills | Status: DC

## 2020-09-27 MED ORDER — EPINEPHRINE 0.3 MG/0.3ML IJ SOAJ: 0.3000 mg | INTRAMUSCULAR | 2 refills | Status: DC | PRN

## 2020-09-27 NOTE — Progress Notes (Signed)
Musculoskeletal Exam  Patient: Kimberly Ali DOB: 1975/09/21  DOS: 09/27/2020  SUBJECTIVE:  Chief Complaint:   Chief Complaint  Patient presents with  . Back Pain  . Neck Pain    Kimberly Ali is a 45 y.o.  female for evaluation and treatment of neck and back pain.   Onset:  2 months ago. No inj or change in activity.  Location: L side of neck and upper back/trap area Character:  sore  Progression of issue:  is unchanged Associated symptoms: no bruising, swelling or redness; ROM decreased  Treatment: to date has been rest, Aleve and heat.   Neurovascular symptoms: tingling in fingers  Past Medical History:  Diagnosis Date  . Anal skin tag   . Eczema   . Headache   . History of hay fever   . History of migraine   . Moderate persistent asthma 12/20/2016  . Urticaria     Objective: VITAL SIGNS: BP 128/78 (BP Location: Left Arm, Patient Position: Sitting, Cuff Size: Normal)   Pulse 98   Temp 98.2 F (36.8 C) (Oral)   Ht 5\' 5"  (1.651 m)   Wt 162 lb 6 oz (73.7 kg)   SpO2 98%   BMI 27.02 kg/m  Constitutional: Well formed, well developed. No acute distress. Thorax & Lungs: No accessory muscle use Musculoskeletal: neck/shoulder.   Normal active range of motion: yes.   Normal passive range of motion: yes Tenderness to palpation: yes over subocc triangle b/l, cerv parasp msc b/l, upper thor parasp msc b/l and L trap Deformity: no Ecchymosis: no Tests positive:  Tests negative: Neurologic: Normal sensory function. No focal deficits noted. DTR's equal and symmetric in UE's. No clonus. 5/5 strength throughout in UE's.  Psychiatric: Normal mood. Age appropriate judgment and insight. Alert & oriented x 3.    Assessment:  Cervical spondylosis  Strain of left trapezius muscle, initial encounter  Plan: Stretches/exercises- go back on HEP 3-4 times weekly, heat, ice, Tylenol. If no better in 3-4 weeks, will refer back to PT. No neuro concerns at this time.   F/u prn. The patient voiced understanding and agreement to the plan.   Buchanan, DO 09/27/20  9:27 AM

## 2020-09-27 NOTE — Patient Instructions (Addendum)
Ice/cold pack over area for 10-15 min twice daily.  Heat (pad or rice pillow in microwave) over affected area, 10-15 minutes twice daily.   OK to take Tylenol 1000 mg (2 extra strength tabs) or 975 mg (3 regular strength tabs) every 6 hours as needed.  Let me know if you want anything sent in.   Go back on your exercise program at least 3 times weekly.  Send me a message if things are worsening and we can set you up with PT again.   Let us know if you need anything.

## 2020-10-09 MED ORDER — MELOXICAM 15 MG PO TABS: 15.0000 mg | ORAL_TABLET | Freq: Every day | ORAL | 0 refills | Status: DC

## 2020-12-01 ENCOUNTER — Ambulatory Visit (AMBULATORY_SURGERY_CENTER): Payer: BC Managed Care – PPO

## 2020-12-01 ENCOUNTER — Other Ambulatory Visit: Payer: Self-pay

## 2020-12-01 VITALS — Ht 65.0 in | Wt 162.0 lb

## 2020-12-01 DIAGNOSIS — Z1211 Encounter for screening for malignant neoplasm of colon: Secondary | ICD-10-CM

## 2020-12-01 MED ORDER — NA SULFATE-K SULFATE-MG SULF 17.5-3.13-1.6 GM/177ML PO SOLN: 1.0000 | Freq: Once | ORAL | 0 refills | Status: AC

## 2020-12-01 NOTE — Progress Notes (Signed)
Pt state she has an egg allergy but is able to eat eggs in bakery items and in foods. No soy allergy known to patient  No issues with past sedation with any surgeries or procedures Patient denies ever being told they had issues or difficulty with intubation  No FH of Malignant Hyperthermia No diet pills per patient No home 02 use per patient  No blood thinners per patient  Pt denies issues with constipation  No A fib or A flutter  EMMI video to pt or via [place] 19 guidelines implemented in PV today with Pt and RN  Pt is fully vaccinated  for Covid    NO PA's for preps discussed with pt In PV today  Discussed with pt there will be an out-of-pocket cost for prep and that varies from $0 to 70 dollars   Due to the COVID-19 pandemic we are asking patients to follow certain guidelines.  Pt aware of COVID protocols and [facility] guidelines

## 2020-12-02 ENCOUNTER — Emergency Department (HOSPITAL_BASED_OUTPATIENT_CLINIC_OR_DEPARTMENT_OTHER)
Admission: EM | Admit: 2020-12-02 | Discharge: 2020-12-02 | Disposition: A | Discharge status: Home / Self Care | Payer: BC Managed Care – PPO | Attending: Emergency Medicine | Admitting: Emergency Medicine

## 2020-12-02 ENCOUNTER — Encounter (HOSPITAL_BASED_OUTPATIENT_CLINIC_OR_DEPARTMENT_OTHER): Payer: Self-pay | Admitting: Emergency Medicine

## 2020-12-02 ENCOUNTER — Other Ambulatory Visit: Payer: Self-pay

## 2020-12-02 DIAGNOSIS — Z91010 Allergy to peanuts: Secondary | ICD-10-CM | POA: Diagnosis not present

## 2020-12-02 DIAGNOSIS — M79675 Pain in left toe(s): Secondary | ICD-10-CM | POA: Diagnosis not present

## 2020-12-02 DIAGNOSIS — Z79899 Other long term (current) drug therapy: Secondary | ICD-10-CM | POA: Insufficient documentation

## 2020-12-02 DIAGNOSIS — Z87891 Personal history of nicotine dependence: Secondary | ICD-10-CM | POA: Diagnosis not present

## 2020-12-02 DIAGNOSIS — I10 Essential (primary) hypertension: Secondary | ICD-10-CM | POA: Insufficient documentation

## 2020-12-02 DIAGNOSIS — B351 Tinea unguium: Secondary | ICD-10-CM | POA: Insufficient documentation

## 2020-12-02 DIAGNOSIS — J4550 Severe persistent asthma, uncomplicated: Secondary | ICD-10-CM | POA: Diagnosis not present

## 2020-12-02 MED ORDER — ACETAMINOPHEN 500 MG PO TABS
1000.0000 mg | ORAL_TABLET | Freq: Once | ORAL | Status: AC
  Administered 2020-12-02: 1000 mg via ORAL
  Filled 2020-12-02: qty 2

## 2020-12-02 MED ORDER — IBUPROFEN 400 MG PO TABS
400.0000 mg | ORAL_TABLET | Freq: Once | ORAL | Status: AC
  Administered 2020-12-02: 400 mg via ORAL
  Filled 2020-12-02: qty 1

## 2020-12-02 NOTE — ED Notes (Signed)
EKG given to Randal Buba, MD.

## 2020-12-02 NOTE — ED Provider Notes (Addendum)
[place] EMERGENCY DEPARTMENT Provider Note   CSN: [id number] Arrival date & time: 12/02/20  0406     History Chief Complaint  Patient presents with  . Toe Pain    Kimberly Ali is a 45 y.o. female.  The history is provided by the patient.  Toe Pain This is a new problem. The current episode started yesterday. The problem occurs constantly. The problem has not changed since onset.Pertinent negatives include no chest pain, no abdominal pain, no headaches and no shortness of breath. Nothing aggravates the symptoms. Nothing relieves the symptoms. She has tried nothing for the symptoms. The treatment provided no relief.  No trauma.  No skin lesions.  Checked in because her mother is here as a patient.      Past Medical History:  Diagnosis Date  . Allergy    hayfever  . Anal skin tag   . Eczema   . Headache   . History of hay fever   . History of migraine   . Hypertension   . Moderate persistent asthma 12/20/2016  . Urticaria     Patient Active Problem List   Diagnosis Date Noted  . OSA (obstructive sleep apnea) 07/17/2020  . Healthcare maintenance 07/17/2020  . Seasonal and perennial allergic rhinoconjunctivitis 03/20/2020  . Severe persistent asthma without complication 55/73/2202  . Adverse food reaction 02/17/2020  . Other atopic dermatitis 02/17/2020  . Cervical spondylosis 09/03/2019  . 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 Moderate persistent asthma 12/20/2016    Past Surgical History:  Procedure Laterality Date  . ABDOMINAL HYSTERECTOMY     Fibroids  . CERVIX SURGERY     x2  . DILATION AND CURETTAGE OF UTERUS    . EXCISION OF SKIN TAG N/A 07/23/2017   Procedure: EXCISION OF SKIN TAG;  Surgeon: Clovis Riley, MD;  Location: [facility];  Service: General;  Laterality: N/A;  . HEMORRHOID SURGERY  2018   states laser surgery for one hemorrhoid     OB History    Gravida  7   Para  5   Term  4   Preterm  1   AB   2   Living  4     SAB  2   IAB  0   Ectopic  0   Multiple  1   Live Births  4           Family History  Problem Relation Age of Onset  . Cancer Father        prostate  . Hypertension Father   . Diabetes Father   . Allergic rhinitis Father   . Alcohol abuse Maternal Grandmother   . Alcohol abuse Maternal Grandfather   . Asthma Neg Hx   . Urticaria Neg Hx   . Eczema Neg Hx   . Colon cancer Neg Hx   . Colon polyps Neg Hx   . Esophageal cancer Neg Hx   . Rectal cancer Neg Hx   . Stomach cancer Neg Hx     Social History   Tobacco Use  . Smoking status: Former Smoker    Packs/day: 0.25    Types: Cigarettes    Quit date: 12/16/2002    Years since quitting: 17.9  . Smokeless tobacco: Never Used  . Tobacco comment: > 16 yrs ago, 1 pack would last her a week  Vaping Use  . Vaping Use: Never used  Substance Use Topics  . Alcohol use: Yes    Alcohol/week:  1.0 standard drink    Types: 1 Glasses of wine per week    Comment: social   . Drug use: No    Home Medications Prior to Admission medications   Medication Sig Start Date End Date Taking? Authorizing Provider  albuterol (VENTOLIN HFA) 108 (90 Base) MCG/ACT inhaler Inhale 1-2 puffs into the lungs every 6 (six) hours as needed for wheezing or shortness of breath. 11/25/19   Maudie Flakes, MD  amLODipine (NORVASC) 5 MG tablet TAKE 1 TABLET BY MOUTH EVERY DAY 06/26/20   Shelda Pal, DO  Azelastine-Fluticasone 137-50 MCG/ACT SUSP Place 1 spray into the nose in the morning and at bedtime. Patient not taking: Reported on 12/01/2020 02/17/20   Garnet Sierras, DO  desonide (DESOWEN) 0.05 % ointment Apply 1 application topically 2 (two) times daily. Use twice a day if needed on eczema flares. Stop when it clears up. 09/27/20   Shelda Pal, DO  EPINEPHrine 0.3 mg/0.3 mL IJ SOAJ injection Inject 0.3 mg into the muscle as needed for anaphylaxis. 09/27/20   Shelda Pal, DO  FLOVENT HFA 110 MCG/ACT  inhaler as needed. 02/24/20   [provider]  fluticasone furoate-vilanterol (BREO ELLIPTA) 200-25 MCG/INH AEPB TAKE 1 PUFF BY MOUTH EVERY DAY 09/01/20   Shelda Pal, DO  hydrocortisone 2.5 % cream Apply topically 2 (two) times daily. 07/10/20   Shelda Pal, DO  levocetirizine (XYZAL) 5 MG tablet Take 5 mg by mouth every evening.    [provider]  meloxicam (MOBIC) 15 MG tablet Take 1 tablet (15 mg total) by mouth daily. Patient not taking: Reported on 12/01/2020 10/09/20   Shelda Pal, DO  montelukast (SINGULAIR) 10 MG tablet TAKE 1 TABLET BY MOUTH EVERYDAY AT BEDTIME 08/22/20   Shelda Pal, DO  naproxen sodium (ALEVE) 220 MG tablet Take 440 mg by mouth as needed (pain).    [provider]  Olopatadine HCl 0.2 % SOLN Apply 1 drop to eye daily as needed (itchy/watery eyes). 02/17/20   Garnet Sierras, DO  Spacer/Aero-Hold Chamber Mask MISC Use with inhaler 12/28/19   Shelda Pal, DO    Allergies    Eggs or egg-derived products, Haemophilus influenzae vaccines, Peanut-containing drug products, Almond (diagnostic), and Dust mite extract  Review of Systems   Review of Systems  Constitutional: Negative for fever.  HENT: Negative for congestion.   Eyes: Negative for visual disturbance.  Respiratory: Negative for shortness of breath.   Cardiovascular: Negative for chest pain.  Gastrointestinal: Negative for abdominal pain.  Genitourinary: Negative for difficulty urinating.  Skin: Negative for rash.  Neurological: Negative for headaches.  Psychiatric/Behavioral: Negative for agitation.  All other systems reviewed and are negative.   Physical Exam Updated Vital Signs BP (!) 153/99 (BP Location: Right Arm)   Pulse 87   Temp 98.3 F (36.8 C) (Oral)   Resp 18   Ht 5\' 5"  (1.651 m)   Wt 72.6 kg   SpO2 100%   BMI 26.63 kg/m   Physical Exam Vitals and nursing note reviewed.  Constitutional:      General: She is  not in acute distress.    Appearance: Normal appearance.  HENT:     Head: Normocephalic and atraumatic.     Nose: Nose normal.  Eyes:     Conjunctiva/sclera: Conjunctivae normal.     Pupils: Pupils are equal, round, and reactive to light.  Cardiovascular:     Rate and Rhythm: Normal rate and  regular rhythm.     Pulses: Normal pulses.     Heart sounds: Normal heart sounds.  Pulmonary:     Effort: Pulmonary effort is normal.     Breath sounds: Normal breath sounds.  Abdominal:     General: Abdomen is flat. Bowel sounds are normal.     Palpations: Abdomen is soft.     Tenderness: There is no abdominal tenderness. There is no guarding.  Musculoskeletal:        General: Normal range of motion.     Cervical back: Normal range of motion and neck supple.     Left foot: Normal.     Comments: Left great toenail is thickened secondary to nail fungus and is loose  Skin:    General: Skin is warm and dry.     Capillary Refill: Capillary refill takes less than 2 seconds.  Neurological:     General: No focal deficit present.     Mental Status: She is alert and oriented to person, place, and time.  Psychiatric:        Mood and Affect: Mood normal.        Behavior: Behavior normal.     ED Results / Procedures / Treatments   Labs (all labs ordered are listed, but only abnormal results are displayed) Labs Reviewed - No data to display  EKG None  Radiology No results found.  Procedures Procedures   Medications Ordered in ED Medications  acetaminophen (TYLENOL) tablet 1,000 mg (has no administration in time range)  ibuprofen (ADVIL) tablet 400 mg (has no administration in time range)    ED Course  I have reviewed the triage vital signs and the nursing notes.  Pertinent labs & imaging results that were available during my care of the patient were reviewed by me and considered in my medical decision making (see chart for details).    I suspect the patient hit her toe and it  loosened the thickened nail causing the pain.  Alternate tylenol and ibuprofen.     At discharge complained of feeling stressed related to her mother for days.  She said nothing during the triage nor during my history nor the review of systems.  EKG obtained and is normal.  I suspect the patient [place] having anxiety related to her mother's current and recent illness.  Patient is stable for discharge with close follow up.   Brynda Rim Avans was evaluated in Emergency Department on 12/02/2020 for the symptoms described in the history of present illness. She was evaluated in the context of the global COVID-19 pandemic, which necessitated consideration that the patient might be at risk for infection with the SARS-CoV-2 virus that causes COVID-19. Institutional protocols and algorithms that pertain to the evaluation of patients at risk for COVID-19 are in a state of rapid change based on information released by regulatory bodies including the CDC and federal and state organizations. These policies and algorithms were followed during the patient's care in the ED.  Final Clinical Impression(s) / ED Diagnoses Final diagnoses:  Pain of toe of left foot   Return for intractable cough, coughing up blood, fevers >100.4 unrelieved by medication, shortness of breath, intractable vomiting, chest pain, shortness of breath, weakness, numbness, changes in speech, facial asymmetry, abdominal pain, passing out, Inability to tolerate liquids or food, cough, altered mental status or any concerns. No signs of systemic illness or infection. The patient is nontoxic-appearing on exam and vital signs are within normal limits.  I have reviewed the  triage vital signs and the nursing notes. Pertinent labs & imaging results that were available during my care of the patient were reviewed by me and considered in my medical decision making (see chart for details). After history, exam, and medical workup I feel the patient has been  appropriately medically screened and is safe for discharge home. Pertinent diagnoses were discussed with the patient. Patient was given return precautions.      Analeia Ismael, MD 12/02/20 [place], Roann Merk, MD 12/02/20 [id number]

## 2020-12-02 NOTE — ED Triage Notes (Signed)
Pt states her left big toe hurts and the pain is radiating up her leg. Denies injury. Reports pain started 2 days ago.

## 2020-12-07 ENCOUNTER — Other Ambulatory Visit: Payer: Self-pay | Admitting: Family Medicine

## 2020-12-11 ENCOUNTER — Encounter: Payer: Self-pay | Admitting: Internal Medicine

## 2020-12-11 ENCOUNTER — Telehealth: Payer: Self-pay | Admitting: Internal Medicine

## 2020-12-11 NOTE — Telephone Encounter (Signed)
Called to remind patient of her procedure she stated that her care partner could not take her.  She rescheduled for 03-14-21

## 2020-12-13 DIAGNOSIS — Z1152 Encounter for screening for COVID-19: Secondary | ICD-10-CM | POA: Diagnosis not present

## 2020-12-14 ENCOUNTER — Encounter: Payer: BC Managed Care – PPO | Admitting: Internal Medicine

## 2020-12-30 ENCOUNTER — Encounter (HOSPITAL_BASED_OUTPATIENT_CLINIC_OR_DEPARTMENT_OTHER): Payer: Self-pay | Admitting: *Deleted

## 2020-12-30 ENCOUNTER — Emergency Department (HOSPITAL_BASED_OUTPATIENT_CLINIC_OR_DEPARTMENT_OTHER)
Admission: EM | Admit: 2020-12-30 | Discharge: 2020-12-30 | Disposition: A | Discharge status: Home / Self Care | Payer: BC Managed Care – PPO | Attending: Emergency Medicine | Admitting: Emergency Medicine

## 2020-12-30 ENCOUNTER — Emergency Department (HOSPITAL_BASED_OUTPATIENT_CLINIC_OR_DEPARTMENT_OTHER): Payer: BC Managed Care – PPO

## 2020-12-30 ENCOUNTER — Other Ambulatory Visit: Payer: Self-pay

## 2020-12-30 DIAGNOSIS — R197 Diarrhea, unspecified: Secondary | ICD-10-CM | POA: Insufficient documentation

## 2020-12-30 DIAGNOSIS — Z91010 Allergy to peanuts: Secondary | ICD-10-CM | POA: Diagnosis not present

## 2020-12-30 DIAGNOSIS — I10 Essential (primary) hypertension: Secondary | ICD-10-CM | POA: Insufficient documentation

## 2020-12-30 DIAGNOSIS — R109 Unspecified abdominal pain: Secondary | ICD-10-CM

## 2020-12-30 DIAGNOSIS — R112 Nausea with vomiting, unspecified: Secondary | ICD-10-CM | POA: Diagnosis not present

## 2020-12-30 DIAGNOSIS — R1033 Periumbilical pain: Secondary | ICD-10-CM | POA: Insufficient documentation

## 2020-12-30 DIAGNOSIS — Z87891 Personal history of nicotine dependence: Secondary | ICD-10-CM | POA: Diagnosis not present

## 2020-12-30 DIAGNOSIS — Z79899 Other long term (current) drug therapy: Secondary | ICD-10-CM | POA: Insufficient documentation

## 2020-12-30 DIAGNOSIS — J4550 Severe persistent asthma, uncomplicated: Secondary | ICD-10-CM | POA: Insufficient documentation

## 2020-12-30 DIAGNOSIS — Z7951 Long term (current) use of inhaled steroids: Secondary | ICD-10-CM | POA: Insufficient documentation

## 2020-12-30 LAB — URINALYSIS, ROUTINE W REFLEX MICROSCOPIC
Bilirubin Urine: NEGATIVE
Glucose, UA: NEGATIVE mg/dL
Hgb urine dipstick: NEGATIVE
Ketones, ur: NEGATIVE mg/dL
Leukocytes,Ua: NEGATIVE
Nitrite: NEGATIVE
Protein, ur: NEGATIVE mg/dL
Specific Gravity, Urine: 1.02 (ref 1.005–1.030)
pH: 6 (ref 5.0–8.0)

## 2020-12-30 LAB — CBC WITH DIFFERENTIAL/PLATELET
Abs Immature Granulocytes: 0.03 10*3/uL (ref 0.00–0.07)
Basophils Absolute: 0 10*3/uL (ref 0.0–0.1)
Basophils Relative: 1 %
Eosinophils Absolute: 0.2 10*3/uL (ref 0.0–0.5)
Eosinophils Relative: 3 %
HCT: 38.2 % (ref 36.0–46.0)
Hemoglobin: 12.1 g/dL (ref 12.0–15.0)
Immature Granulocytes: 0 %
Lymphocytes Relative: 25 %
Lymphs Abs: 2 10*3/uL (ref 0.7–4.0)
MCH: 27.2 pg (ref 26.0–34.0)
MCHC: 31.7 g/dL (ref 30.0–36.0)
MCV: 85.8 fL (ref 80.0–100.0)
Monocytes Absolute: 0.7 10*3/uL (ref 0.1–1.0)
Monocytes Relative: 9 %
Neutro Abs: 5 10*3/uL (ref 1.7–7.7)
Neutrophils Relative %: 62 %
Platelets: 303 10*3/uL (ref 150–400)
RBC: 4.45 MIL/uL (ref 3.87–5.11)
RDW: 13.6 % (ref 11.5–15.5)
WBC: 8 10*3/uL (ref 4.0–10.5)
nRBC: 0 % (ref 0.0–0.2)

## 2020-12-30 LAB — COMPREHENSIVE METABOLIC PANEL
ALT: 14 U/L (ref 0–44)
AST: 18 U/L (ref 15–41)
Albumin: 4.1 g/dL (ref 3.5–5.0)
Alkaline Phosphatase: 46 U/L (ref 38–126)
Anion gap: 6 (ref 5–15)
BUN: 12 mg/dL (ref 6–20)
CO2: 27 mmol/L (ref 22–32)
Calcium: 9.1 mg/dL (ref 8.9–10.3)
Chloride: 102 mmol/L (ref 98–111)
Creatinine, Ser: 0.73 mg/dL (ref 0.44–1.00)
GFR, Estimated: >60 mL/min (ref >60)
Glucose, Bld: 114 mg/dL — ABNORMAL HIGH (ref 70–99)
Potassium: 3.4 mmol/L — ABNORMAL LOW (ref 3.5–5.1)
Sodium: 135 mmol/L (ref 135–145)
Total Bilirubin: 0.1 mg/dL — ABNORMAL LOW (ref 0.3–1.2)
Total Protein: 7.3 g/dL (ref 6.5–8.1)

## 2020-12-30 LAB — LIPASE, BLOOD: Lipase: 27 U/L (ref 11–51)

## 2020-12-30 MED ORDER — ONDANSETRON 8 MG PO TBDP: 8.0000 mg | ORAL_TABLET | Freq: Three times a day (TID) | ORAL | 0 refills | Status: DC | PRN

## 2020-12-30 MED ORDER — ONDANSETRON HCL 4 MG/2ML IJ SOLN
4.0000 mg | Freq: Once | INTRAMUSCULAR | Status: AC
  Administered 2020-12-30: 4 mg via INTRAVENOUS
  Filled 2020-12-30: qty 2

## 2020-12-30 MED ORDER — SODIUM CHLORIDE 0.9 % IV SOLN: INTRAVENOUS | Status: DC

## 2020-12-30 MED ORDER — SODIUM CHLORIDE 0.9 % IV BOLUS
1000.0000 mL | Freq: Once | INTRAVENOUS | Status: AC
  Administered 2020-12-30: 1000 mL via INTRAVENOUS

## 2020-12-30 MED ORDER — MORPHINE SULFATE (PF) 4 MG/ML IV SOLN
4.0000 mg | Freq: Once | INTRAVENOUS | Status: DC
Start: 2020-12-30 — End: 2020-12-30
  Filled 2020-12-30: qty 1

## 2020-12-30 NOTE — ED Triage Notes (Signed)
Pt reports n/v and frequent stools since Friday am. C/o mid abdominal pain. Seen at Urgent Care and sent here for eval

## 2020-12-30 NOTE — Discharge Instructions (Addendum)
Take the medication as needed for nausea.  Take over-the-counter medications as needed for cramping.  Return to the ER for trouble with fevers chills increasing pain as we discussed

## 2020-12-30 NOTE — ED Provider Notes (Signed)
[facility] EMERGENCY DEPARTMENT Provider Note   CSN: [id number] Arrival date & time: 12/30/20  1311     History Chief Complaint  Patient presents with  . Abdominal Pain    Kimberly Ali is a 45 y.o. female.  HPI   Patient presents to the ED for evaluation of abdominal pain as well as vomiting.  Patient states she started having symptoms Friday.  She had multiple episodes of vomiting throughout the day.  She also had frequent stools but they were not loose or diarrhea.  Patient states she then started having trouble with abdominal pain in the midline around the periumbilical region.  She has not had any vomiting since last night but she continues to have abdominal pain.  She went to an urgent care today and they recommended ED evaluation.  Patient has had a hysterectomy but denies any other abdominal surgery.  Past Medical History:  Diagnosis Date  . Allergy    hayfever  . Anal skin tag   . Eczema   . Headache   . History of hay fever   . History of migraine   . Hypertension   . Moderate persistent asthma 12/20/2016  . Urticaria     Patient Active Problem List   Diagnosis Date Noted  . OSA (obstructive sleep apnea) 07/17/2020  . Healthcare maintenance 07/17/2020  . Seasonal and perennial allergic rhinoconjunctivitis 03/20/2020  . Severe persistent asthma without complication 59/56/3875  . Adverse food reaction 02/17/2020  . Other atopic dermatitis 02/17/2020  . Cervical spondylosis 09/03/2019  . 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 Moderate persistent asthma 12/20/2016    Past Surgical History:  Procedure Laterality Date  . ABDOMINAL HYSTERECTOMY     Fibroids  . CERVIX SURGERY     x2  . DILATION AND CURETTAGE OF UTERUS    . EXCISION OF SKIN TAG N/A 07/23/2017   Procedure: EXCISION OF SKIN TAG;  Surgeon: Clovis Riley, MD;  Location: [facility];  Service: General;  Laterality: N/A;  . HEMORRHOID SURGERY  2018   states laser  surgery for one hemorrhoid     OB History    Gravida  7   Para  5   Term  4   Preterm  1   AB  2   Living  4     SAB  2   IAB  0   Ectopic  0   Multiple  1   Live Births  4           Family History  Problem Relation Age of Onset  . Cancer Father        prostate  . Hypertension Father   . Diabetes Father   . Allergic rhinitis Father   . Alcohol abuse Maternal Grandmother   . Alcohol abuse Maternal Grandfather   . Asthma Neg Hx   . Urticaria Neg Hx   . Eczema Neg Hx   . Colon cancer Neg Hx   . Colon polyps Neg Hx   . Esophageal cancer Neg Hx   . Rectal cancer Neg Hx   . Stomach cancer Neg Hx     Social History   Tobacco Use  . Smoking status: Former Smoker    Packs/day: 0.25    Types: Cigarettes    Quit date: 12/16/2002    Years since quitting: 18.0  . Smokeless tobacco: Never Used  . Tobacco comment: > 16 yrs ago, 1 pack would last her a week  Vaping  Use  . Vaping Use: Never used  Substance Use Topics  . Alcohol use: Yes    Alcohol/week: 1.0 standard drink    Types: 1 Glasses of wine per week    Comment: social   . Drug use: No    Home Medications Prior to Admission medications   Medication Sig Start Date End Date Taking? Authorizing Provider  ondansetron (ZOFRAN ODT) 8 MG disintegrating tablet Take 1 tablet (8 mg total) by mouth every 8 (eight) hours as needed for nausea or vomiting. 12/30/20  Yes Dorie Rank, MD  albuterol (VENTOLIN HFA) 108 (90 Base) MCG/ACT inhaler Inhale 1-2 puffs into the lungs every 6 (six) hours as needed for wheezing or shortness of breath. 11/25/19   Maudie Flakes, MD  amLODipine (NORVASC) 5 MG tablet TAKE 1 TABLET BY MOUTH EVERY DAY 06/26/20   Shelda Pal, DO  Azelastine-Fluticasone 137-50 MCG/ACT SUSP Place 1 spray into the nose in the morning and at bedtime. Patient not taking: Reported on 12/01/2020 02/17/20   Garnet Sierras, DO  desonide (DESOWEN) 0.05 % ointment Apply 1 application topically 2 (two) times  daily. Use twice a day if needed on eczema flares. Stop when it clears up. 09/27/20   Shelda Pal, DO  EPINEPHrine 0.3 mg/0.3 mL IJ SOAJ injection Inject 0.3 mg into the muscle as needed for anaphylaxis. 09/27/20   Shelda Pal, DO  FLOVENT HFA 110 MCG/ACT inhaler as needed. 02/24/20   [provider]  fluticasone furoate-vilanterol (BREO ELLIPTA) 200-25 MCG/INH AEPB TAKE 1 PUFF BY MOUTH EVERY DAY 09/01/20   Shelda Pal, DO  hydrocortisone 2.5 % cream Apply topically 2 (two) times daily. 07/10/20   Shelda Pal, DO  levocetirizine (XYZAL) 5 MG tablet TAKE 1 TABLET BY MOUTH EVERY DAY IN THE EVENING 12/07/20   Wendling, Crosby Oyster, DO  meloxicam (MOBIC) 15 MG tablet Take 1 tablet (15 mg total) by mouth daily. Patient not taking: Reported on 12/01/2020 10/09/20   Shelda Pal, DO  montelukast (SINGULAIR) 10 MG tablet TAKE 1 TABLET BY MOUTH EVERYDAY AT BEDTIME 08/22/20   Shelda Pal, DO  naproxen sodium (ALEVE) 220 MG tablet Take 440 mg by mouth as needed (pain).    [provider]  Olopatadine HCl 0.2 % SOLN Apply 1 drop to eye daily as needed (itchy/watery eyes). 02/17/20   Garnet Sierras, DO  Spacer/Aero-Hold Chamber Mask MISC Use with inhaler 12/28/19   Shelda Pal, DO    Allergies    Eggs or egg-derived products, Haemophilus influenzae vaccines, Peanut-containing drug products, Almond (diagnostic), and Dust mite extract  Review of Systems   Review of Systems  All other systems reviewed and are negative.   Physical Exam Updated Vital Signs BP 133/77   Pulse 73   Temp 98.1 F (36.7 C) (Oral)   Resp 18   Ht 1.651 m (5\' 5" )   Wt 71.2 kg   SpO2 100%   BMI 26.13 kg/m   Physical Exam Vitals and nursing note reviewed.  Constitutional:      General: She is not in acute distress.    Appearance: She is well-developed.  HENT:     Head: Normocephalic and atraumatic.     Right Ear: External ear normal.      Left Ear: External ear normal.  Eyes:     General: No scleral icterus.       Right eye: No discharge.        Left eye:  No discharge.     Conjunctiva/sclera: Conjunctivae normal.  Neck:     Trachea: No tracheal deviation.  Cardiovascular:     Rate and Rhythm: Normal rate and regular rhythm.  Pulmonary:     Effort: Pulmonary effort is normal. No respiratory distress.     Breath sounds: Normal breath sounds. No stridor. No wheezing or rales.  Abdominal:     General: Bowel sounds are normal. There is no distension.     Palpations: Abdomen is soft.     Tenderness: There is abdominal tenderness in the epigastric area and periumbilical area. There is no guarding or rebound.  Musculoskeletal:        General: No tenderness.     Cervical back: Neck supple.  Skin:    General: Skin is warm and dry.     Findings: No rash.  Neurological:     Mental Status: She is alert.     Cranial Nerves: No cranial nerve deficit (no facial droop, extraocular movements intact, no slurred speech).     Sensory: No sensory deficit.     Motor: No abnormal muscle tone or seizure activity.     Coordination: Coordination normal.     ED Results / Procedures / Treatments   Labs (all labs ordered are listed, but only abnormal results are displayed) Labs Reviewed  COMPREHENSIVE METABOLIC PANEL - Abnormal; Notable for the following components:      Result Value   Potassium 3.4 (*)    Glucose, Bld 114 (*)    Total Bilirubin 0.1 (*)    All other components within normal limits  LIPASE, BLOOD  CBC WITH DIFFERENTIAL/PLATELET  URINALYSIS, ROUTINE W REFLEX MICROSCOPIC    EKG None  Radiology DG Abdomen Acute W/Chest  Result Date: 12/30/2020 CLINICAL DATA:  45 year old female with abdominal pain and vomiting. EXAM: DG ABDOMEN ACUTE WITH 1 VIEW CHEST COMPARISON:  11/25/2019 FINDINGS: There is no evidence of dilated bowel loops or free intraperitoneal air. No radiopaque calculi or other significant radiographic  abnormality is seen. Heart size and mediastinal contours are within normal limits. Both lungs are clear. IMPRESSION: Nonobstructive bowel gas pattern.  No acute cardiopulmonary disease. Electronically Signed   By: Ruthann Cancer MD   On: 12/30/2020 14:29    Procedures Procedures   Medications Ordered in ED Medications  sodium chloride 0.9 % bolus 1,000 mL (0 mLs Intravenous Stopped 12/30/20 1514)  ondansetron (ZOFRAN) injection 4 mg (4 mg Intravenous Given 12/30/20 1435)    ED Course  I have reviewed the triage vital signs and the nursing notes.  Pertinent labs & imaging results that were available during my care of the patient were reviewed by me and considered in my medical decision making (see chart for details).  Clinical Course as of 12/31/20 2119  Sat Dec 30, 2020  1442 Acute abdominal series without signs of obstruction [JK]    Clinical Course User Index [JK] Dorie Rank, MD   MDM Rules/Calculators/A&P                          Patient presents to the ED for evaluation of abdominal pain nausea vomiting and diarrhea.  Patient's initial laboratory tests are reassuring.  CBC metabolic panel lipase UA all normal.  Acute abdominal series not suggesting any signs of obstruction.  Repeat exam patient was feeling well.  Overall low suspicion for appendicitis at this time.  No findings to suggest hepatitis pancreatitis.  Doubt biliary colic.  Patient appears  comfortable at this time.  Pt was sent for possible appendicitis. Discussed further imaging including CT scan versus discharge with warning signs and precautions.  Patient comfortable with the latter.  Possible GE illness.   Final Clinical Impression(s) / ED Diagnoses Final diagnoses:  Abdominal pain, unspecified abdominal location    Rx / DC Orders ED Discharge Orders         Ordered    ondansetron (ZOFRAN ODT) 8 MG disintegrating tablet  Every 8 hours PRN        12/30/20 1518           Dorie Rank, MD 12/31/20 [phone]

## 2021-01-08 ENCOUNTER — Other Ambulatory Visit: Payer: Self-pay | Admitting: Family Medicine

## 2021-01-08 DIAGNOSIS — J4540 Moderate persistent asthma, uncomplicated: Secondary | ICD-10-CM

## 2021-01-11 ENCOUNTER — Telehealth: Payer: Self-pay | Admitting: Family Medicine

## 2021-01-11 MED ORDER — ALBUTEROL SULFATE HFA 108 (90 BASE) MCG/ACT IN AERS: 1.0000 | INHALATION_SPRAY | Freq: Four times a day (QID) | RESPIRATORY_TRACT | 5 refills | Status: DC | PRN

## 2021-01-11 NOTE — Telephone Encounter (Signed)
Medication:albuterol (VENTOLIN HFA) 108 (90 Base) MCG/ACT inhaler [[id number]]       Has the patient contacted their pharmacy? no (If no, request that the patient contact the pharmacy for the refill.) (If yes, when and what did the pharmacy advise?)    Preferred Pharmacy (with phone number or street name):  [id number] - [place], [place]. Phone:  [phone]  Fax:  [phone]        Agent: Please be advised that RX refills may take up to 3 business days. We ask that you follow-up with your pharmacy.

## 2021-01-11 NOTE — Telephone Encounter (Signed)
Rx sent 

## 2021-02-13 ENCOUNTER — Other Ambulatory Visit: Payer: Self-pay

## 2021-02-13 ENCOUNTER — Ambulatory Visit (INDEPENDENT_AMBULATORY_CARE_PROVIDER_SITE_OTHER): Payer: BC Managed Care – PPO

## 2021-02-13 ENCOUNTER — Other Ambulatory Visit (HOSPITAL_COMMUNITY)
Admission: RE | Admit: 2021-02-13 | Discharge: 2021-02-13 | Disposition: A | Discharge status: Home / Self Care | Payer: BC Managed Care – PPO | Source: Ambulatory Visit | Attending: Advanced Practice Midwife | Admitting: Advanced Practice Midwife

## 2021-02-13 VITALS — BP 139/90 | HR 90 | Wt 162.0 lb

## 2021-02-13 DIAGNOSIS — N898 Other specified noninflammatory disorders of vagina: Secondary | ICD-10-CM | POA: Insufficient documentation

## 2021-02-13 NOTE — Progress Notes (Addendum)
SUBJECTIVE:  45 y.o. female complains of clear vaginal discharge and irritation for 1 week(s). Denies abnormal vaginal bleeding or significant pelvic pain or fever. No UTI symptoms. Pt requests to be tested for STDs.   No LMP recorded. Patient has had a hysterectomy.  OBJECTIVE:  She appears well, afebrile. Urine dipstick: not done.  ASSESSMENT:  Vaginal Discharge  Vaginal Odor   PLAN:  GC, chlamydia, trichomonas, BVAG, CVAG probe sent to lab. Treatment: To be determined once lab results are received ROV prn if symptoms persist or worsen.   Patient was assessed and managed by nursing staff during this encounter. I have reviewed the chart and agree with the documentation and plan. I have also made any necessary editorial changes.  Hansel Feinstein, CNM 02/13/2021 11:43 PM

## 2021-02-15 LAB — CERVICOVAGINAL ANCILLARY ONLY
Bacterial Vaginitis (gardnerella): POSITIVE — AB
Candida Glabrata: NEGATIVE
Candida Vaginitis: NEGATIVE
Chlamydia: NEGATIVE
Comment: NEGATIVE
Comment: NEGATIVE
Comment: NEGATIVE
Comment: NEGATIVE
Comment: NEGATIVE
Comment: NORMAL
Neisseria Gonorrhea: NEGATIVE
Trichomonas: NEGATIVE

## 2021-02-16 ENCOUNTER — Other Ambulatory Visit: Payer: Self-pay | Admitting: Advanced Practice Midwife

## 2021-02-16 MED ORDER — METRONIDAZOLE 500 MG PO TABS: 500.0000 mg | ORAL_TABLET | Freq: Two times a day (BID) | ORAL | 0 refills | Status: AC

## 2021-02-16 NOTE — Progress Notes (Signed)
Wet prep showed BV Rx sent for Flagyl per protocol

## 2021-02-28 ENCOUNTER — Other Ambulatory Visit: Payer: Self-pay

## 2021-02-28 ENCOUNTER — Ambulatory Visit (AMBULATORY_SURGERY_CENTER): Payer: BC Managed Care – PPO | Admitting: *Deleted

## 2021-02-28 ENCOUNTER — Telehealth: Payer: Self-pay | Admitting: *Deleted

## 2021-02-28 VITALS — Ht 65.0 in | Wt 152.0 lb

## 2021-02-28 DIAGNOSIS — Z1211 Encounter for screening for malignant neoplasm of colon: Secondary | ICD-10-CM

## 2021-02-28 NOTE — Telephone Encounter (Signed)
1000- attempted to reach pt to do her PV over the phone.  LMOM to call back  1006- attempted to reach pt again. LMOM to call back    1015- able to reach pt.  PV done

## 2021-02-28 NOTE — Progress Notes (Signed)
Pt states egg products cause nausea and vomiting only, no breathing issues; she is able to eat these things in baked good   No trouble with anesthesia, denies being told they were difficult to intubate, or hx/fam hx of malignant hyperthermia per pt  Pt has Suprep at home already   [facility] allergy  No home oxygen use   No medications for weight loss taken  emmi information given  Pt denies constipation issues  Pt states she had a seizure April 2021- MD felt it was asthma induced and she has not had any further issues at all.  Pt's previsit is done over the phone and all paperwork (prep instructions, blank consent form to just read over) sent to patient.  Pt's name and DOB verified at the beginning of the previsit.  Pt denies any difficulty with ambulating.

## 2021-03-07 IMAGING — DX DG CHEST 1V PORT
1 series · 1 of 1 positions shown · non-contrast
Comparison: 08/29/2019

CLINICAL DATA: Shortness of breath

EXAM:
PORTABLE CHEST 1 VIEW

[chest ap]
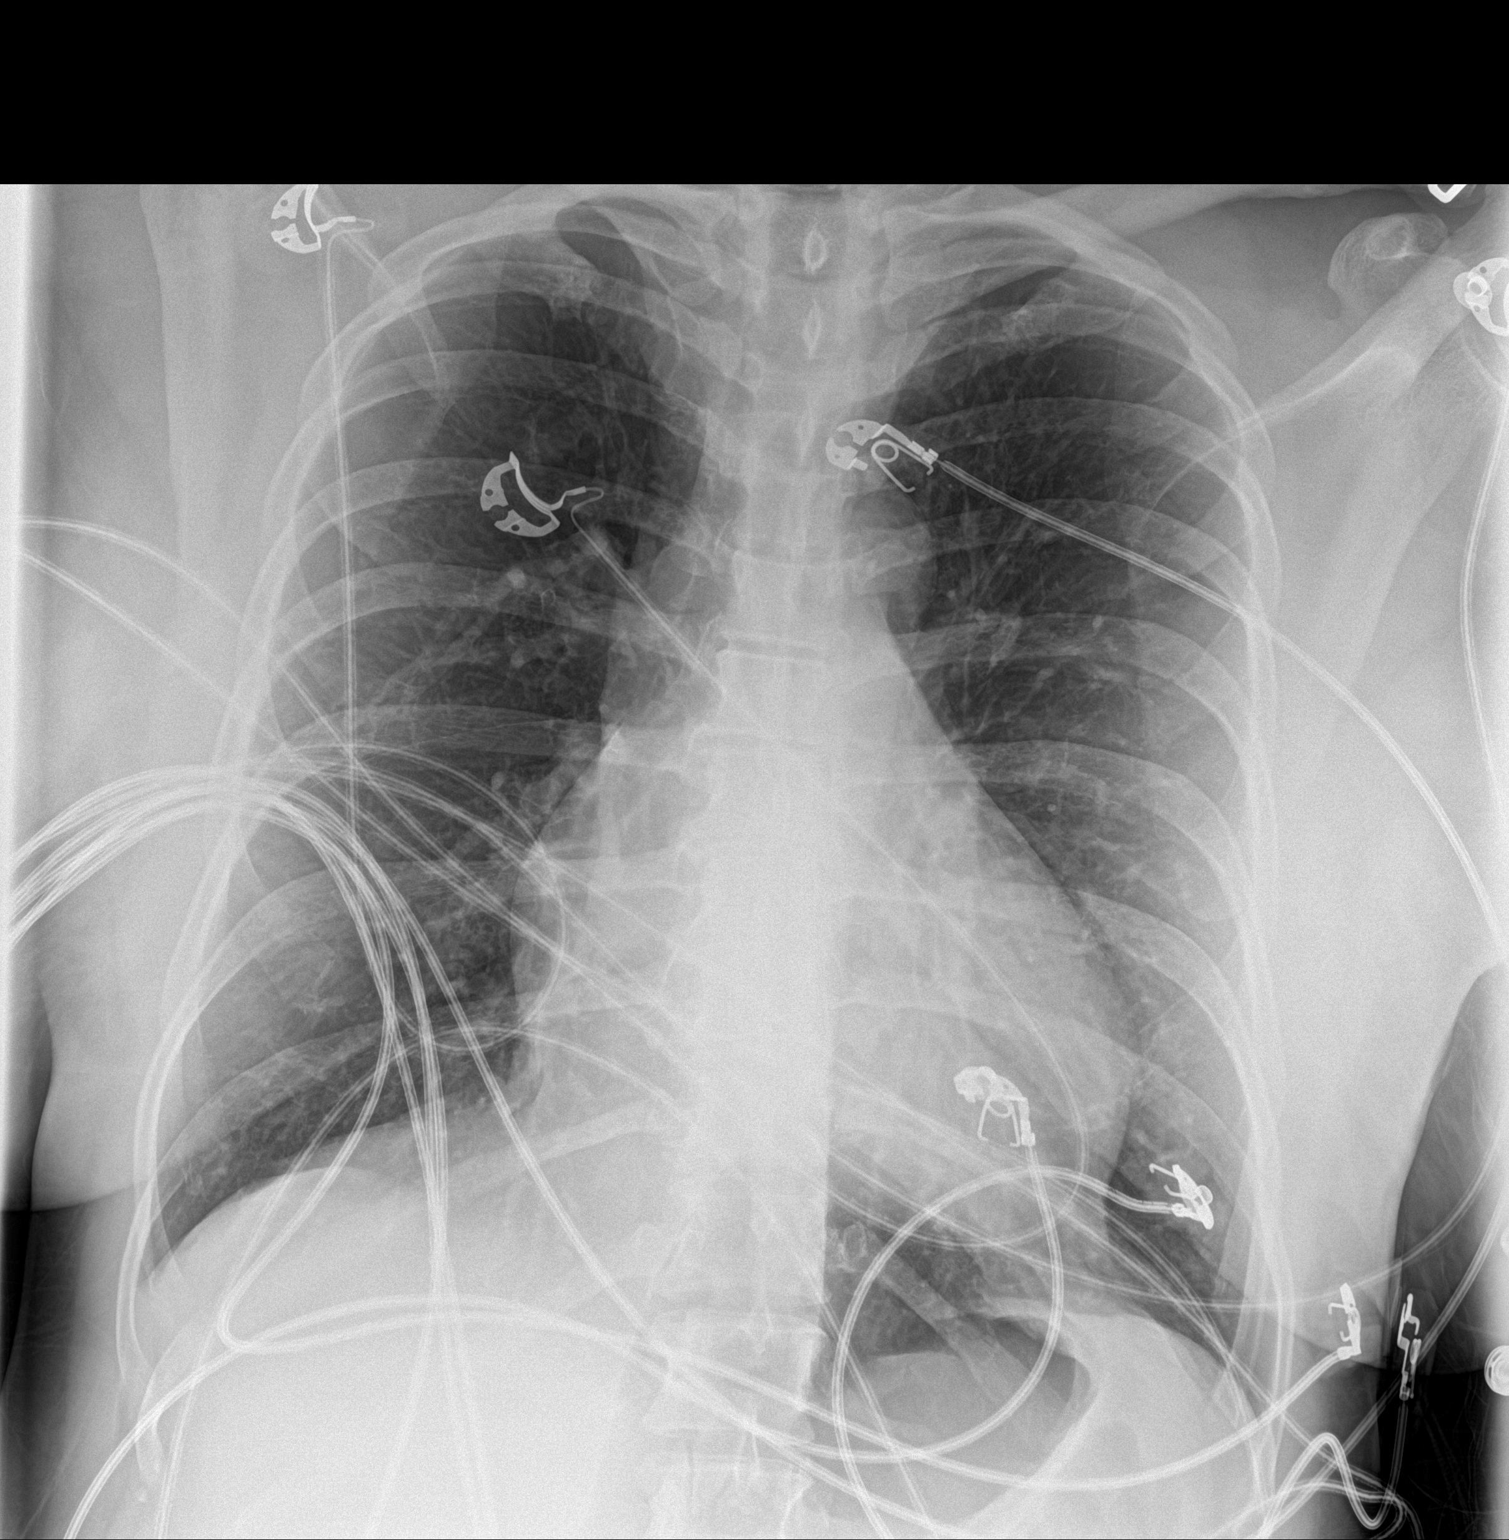

[1 of 1 positions shown; findings below may reference images not displayed]

FINDINGS: The heart size and mediastinal contours are within normal limits.
Both lungs are clear. No pleural effusion or pneumothorax. The
visualized skeletal structures are unremarkable.
IMPRESSION: No acute process in the chest.

## 2021-03-14 ENCOUNTER — Telehealth: Payer: Self-pay | Admitting: Internal Medicine

## 2021-03-14 ENCOUNTER — Encounter: Payer: BC Managed Care – PPO | Admitting: Internal Medicine

## 2021-03-14 NOTE — Telephone Encounter (Signed)
Patient just called and said she was placed in quarantine and will not be coming in today for her procedure.

## 2021-04-25 ENCOUNTER — Ambulatory Visit: Payer: BC Managed Care – PPO

## 2021-04-25 ENCOUNTER — Other Ambulatory Visit (HOSPITAL_COMMUNITY)
Admission: RE | Admit: 2021-04-25 | Discharge: 2021-04-25 | Disposition: A | Discharge status: Home / Self Care | Payer: BC Managed Care – PPO | Source: Ambulatory Visit | Attending: Family Medicine | Admitting: Family Medicine

## 2021-04-25 ENCOUNTER — Other Ambulatory Visit: Payer: Self-pay

## 2021-04-25 VITALS — BP 132/84 | HR 72 | Wt 162.0 lb

## 2021-04-25 DIAGNOSIS — N898 Other specified noninflammatory disorders of vagina: Secondary | ICD-10-CM | POA: Insufficient documentation

## 2021-04-25 NOTE — Progress Notes (Signed)
Chart reviewed - agree with CMA/RN documentation.  ° °

## 2021-04-25 NOTE — Progress Notes (Signed)
SUBJECTIVE:  45 y.o. female complains of white vaginal discharge for 1 week(s). Denies abnormal vaginal bleeding or significant pelvic pain or fever. No UTI symptoms. Denies history of known exposure to STD.  No LMP recorded. Patient has had a hysterectomy.  OBJECTIVE:  She appears well, afebrile. Urine dipstick: not done.  ASSESSMENT:  Vaginal irritation   PLAN:  GC, chlamydia, trichomonas, BVAG, CVAG probe sent to lab. Treatment: To be determined once lab results are received ROV prn if symptoms persist or worsen.

## 2021-04-26 LAB — CERVICOVAGINAL ANCILLARY ONLY
Bacterial Vaginitis (gardnerella): POSITIVE — AB
Candida Glabrata: NEGATIVE
Candida Vaginitis: POSITIVE — AB
Chlamydia: NEGATIVE
Comment: NEGATIVE
Comment: NEGATIVE
Comment: NEGATIVE
Comment: NEGATIVE
Comment: NEGATIVE
Comment: NORMAL
Neisseria Gonorrhea: NEGATIVE
Trichomonas: NEGATIVE

## 2021-04-27 ENCOUNTER — Encounter: Payer: BC Managed Care – PPO | Admitting: Family Medicine

## 2021-04-27 MED ORDER — FLUCONAZOLE 150 MG PO TABS: 150.0000 mg | ORAL_TABLET | Freq: Once | ORAL | 3 refills | Status: AC

## 2021-04-27 MED ORDER — METRONIDAZOLE 500 MG PO TABS: 500.0000 mg | ORAL_TABLET | Freq: Two times a day (BID) | ORAL | 0 refills | Status: DC

## 2021-04-27 NOTE — Addendum Note (Signed)
Addended by: Truett Mainland on: 04/27/2021 12:18 PM   Modules accepted: Orders

## 2021-05-04 ENCOUNTER — Encounter: Payer: Self-pay | Admitting: Family Medicine

## 2021-05-04 ENCOUNTER — Encounter: Payer: BC Managed Care – PPO | Admitting: Family Medicine

## 2021-06-14 ENCOUNTER — Other Ambulatory Visit: Payer: Self-pay | Admitting: Family Medicine

## 2021-06-14 DIAGNOSIS — N6310 Unspecified lump in the right breast, unspecified quadrant: Secondary | ICD-10-CM

## 2021-06-29 ENCOUNTER — Other Ambulatory Visit: Payer: Self-pay

## 2021-06-29 ENCOUNTER — Emergency Department (HOSPITAL_BASED_OUTPATIENT_CLINIC_OR_DEPARTMENT_OTHER)
Admission: EM | Admit: 2021-06-29 | Discharge: 2021-06-29 | Disposition: A | Discharge status: Home / Self Care | Payer: BC Managed Care – PPO | Attending: Emergency Medicine | Admitting: Emergency Medicine

## 2021-06-29 ENCOUNTER — Emergency Department (HOSPITAL_BASED_OUTPATIENT_CLINIC_OR_DEPARTMENT_OTHER): Payer: BC Managed Care – PPO

## 2021-06-29 ENCOUNTER — Encounter (HOSPITAL_BASED_OUTPATIENT_CLINIC_OR_DEPARTMENT_OTHER): Payer: Self-pay | Admitting: Emergency Medicine

## 2021-06-29 DIAGNOSIS — Z87891 Personal history of nicotine dependence: Secondary | ICD-10-CM | POA: Insufficient documentation

## 2021-06-29 DIAGNOSIS — R0602 Shortness of breath: Secondary | ICD-10-CM | POA: Diagnosis present

## 2021-06-29 DIAGNOSIS — Z20822 Contact with and (suspected) exposure to covid-19: Secondary | ICD-10-CM | POA: Diagnosis not present

## 2021-06-29 DIAGNOSIS — Z91010 Allergy to peanuts: Secondary | ICD-10-CM | POA: Insufficient documentation

## 2021-06-29 DIAGNOSIS — J4521 Mild intermittent asthma with (acute) exacerbation: Secondary | ICD-10-CM | POA: Diagnosis not present

## 2021-06-29 DIAGNOSIS — J040 Acute laryngitis: Secondary | ICD-10-CM | POA: Insufficient documentation

## 2021-06-29 DIAGNOSIS — I10 Essential (primary) hypertension: Secondary | ICD-10-CM | POA: Diagnosis not present

## 2021-06-29 LAB — RESP PANEL BY RT-PCR (FLU A&B, COVID) ARPGX2
Influenza A by PCR: NEGATIVE
Influenza B by PCR: NEGATIVE
SARS Coronavirus 2 by RT PCR: NEGATIVE

## 2021-06-29 MED ORDER — PREDNISONE 10 MG PO TABS: 20.0000 mg | ORAL_TABLET | Freq: Every day | ORAL | 0 refills | Status: AC

## 2021-06-29 MED ORDER — BENZONATATE 100 MG PO CAPS: 100.0000 mg | ORAL_CAPSULE | Freq: Three times a day (TID) | ORAL | 0 refills | Status: DC

## 2021-06-29 MED ORDER — PREDNISONE 50 MG PO TABS
60.0000 mg | ORAL_TABLET | Freq: Once | ORAL | Status: AC
  Administered 2021-06-29: 60 mg via ORAL
  Filled 2021-06-29: qty 1

## 2021-06-29 MED ORDER — FLUTICASONE PROPIONATE 50 MCG/ACT NA SUSP: 2.0000 | Freq: Every day | NASAL | 0 refills | Status: DC

## 2021-06-29 NOTE — Discharge Instructions (Signed)
You were seen in the emergency room today with voice hoarseness, shortness of breath, wheezing at home.  Your lungs sound clear here today and your x-ray does not show any pneumonia or other cause for your symptoms.  I suspect you are developing a mild viral infection and this is flaring her asthma.  I am starting you on 5 days of steroid.  Please continue your home nebulizer and albuterol treatments as prescribed.  I have called in other medications to help with symptoms including cough and nasal spray.  Follow closely with your primary care doctor.  Return to the emergency department any new or suddenly worsening symptoms.

## 2021-06-29 NOTE — ED Notes (Addendum)
No acute distress noted upon this RN's departure of patient. Elevated BP noted by RN - MD made aware - Patient notified to discuss elevated BP with per PCP -  Patient verbalized understanding. Verified discharge paperwork with name and DOB. Vital signs stable. Patient taken to checkout window. Discharge paperwork discussed with patient. No further questions voiced upon discharge.

## 2021-06-29 NOTE — ED Triage Notes (Signed)
Sob since Wednesday.  Pt has asthma and feels like it is flaring up.  Pt has chest tightness, cough, sob.  Pulse ox 98% on RA.  Pt using albuterol inhaler and nebulizer that has helped some but having to use more often than usual and continues with chest tightness.

## 2021-06-29 NOTE — ED Provider Notes (Signed)
Emergency Department Provider Note   I have reviewed the triage vital signs and the nursing notes.   HISTORY  Chief Complaint Shortness of Breath   HPI Kimberly Ali is a 45 y.o. female with past history reviewed below presents emergency department with chief tightness, cough, shortness of breath.  She has had slight voice change with laryngitis type symptoms along with nasal congestion.  She denies any severe coughing but has had a mild cough.  She has not been feeling body aches or fever.  No abdominal pain, vomiting, diarrhea.  No sick contacts.  She is been using her albuterol inhaler and nebulizers which gave her temporary relief but then 4 to 5 hours later she feels the need to use her inhalers again.  She did use her inhaler this morning prior to ED presentation. No radiation of symptoms or modifying factors.    Past Medical History:  Diagnosis Date   Allergy    hayfever   Anal skin tag    Eczema    Headache    History of hay fever    History of migraine    Hypertension    Moderate persistent asthma 12/20/2016   Seizures ([place])    11-25-19- one time occurence, asthma induced, none since   Urticaria     Patient Active Problem List   Diagnosis Date Noted   GAD (generalized anxiety disorder) 07/04/2021   OSA (obstructive sleep apnea) 07/17/2020   Healthcare maintenance 07/17/2020   Seasonal and perennial allergic rhinoconjunctivitis 03/20/2020   Severe persistent asthma without complication 17/61/6073   Adverse food reaction 02/17/2020   Other atopic dermatitis 02/17/2020   Cervical spondylosis 09/03/2019   Vestibular migraine 08/18/2019   Moderate persistent asthma 12/20/2016    Past Surgical History:  Procedure Laterality Date   ABDOMINAL HYSTERECTOMY     Fibroids   CERVIX SURGERY     x2   DILATION AND CURETTAGE OF UTERUS     EXCISION OF SKIN TAG N/A 07/23/2017   Procedure: EXCISION OF SKIN TAG;  Surgeon: Clovis Riley, MD;  Location: [place];  Service: General;  Laterality: N/A;   [place]  2018   states laser surgery for one hemorrhoid    Allergies Eggs or egg-derived products, Haemophilus influenzae vaccines, Peanut-containing drug products, Almond (diagnostic), and Dust mite extract  Family History  Problem Relation Age of Onset   Cancer Father        prostate   Hypertension Father    Diabetes Father    Allergic rhinitis Father    Alcohol abuse Maternal Grandmother    Alcohol abuse Maternal Grandfather    Asthma Neg Hx    Urticaria Neg Hx    Eczema Neg Hx    Colon cancer Neg Hx    Colon polyps Neg Hx    Esophageal cancer Neg Hx    Rectal cancer Neg Hx    Stomach cancer Neg Hx     Social History Social History   Tobacco Use   Smoking status: Former    Packs/day: 0.25    Types: Cigarettes    Quit date: 12/16/2002    Years since quitting: 18.5   Smokeless tobacco: Never   Tobacco comments:    > 16 yrs ago, 1 pack would last her a week  Vaping Use   Vaping Use: Never used  Substance Use Topics   Alcohol use: Yes    Alcohol/week: 1.0 standard drink    Types: 1 Glasses of  wine per week    Comment: social    Drug use: No    Review of Systems  Constitutional: No fever/chills Eyes: No visual changes. ENT: No sore throat. Positive voice changes.  Cardiovascular: Denies chest pain. Respiratory: Mild shortness of breath with cough and congestion.  Gastrointestinal: No abdominal pain.  No nausea, no vomiting.  No diarrhea.  No constipation. Genitourinary: Negative for dysuria. Musculoskeletal: Negative for back pain. Skin: Negative for rash. Neurological: Negative for headaches, focal weakness or numbness.  10-point ROS otherwise negative.  ____________________________________________   PHYSICAL EXAM:  VITAL SIGNS: Vitals:   06/29/21 0915 06/29/21 1000  BP: (!) 151/97 (!) 143/105  Pulse: 89 77  Resp: 20 20  Temp:  98.1 F (36.7 C)  SpO2: 98% 99%    Constitutional: Alert and oriented. Well appearing and in no acute distress. Eyes: Conjunctivae are normal.  Head: Atraumatic. Nose: Positive congestion/rhinnorhea. Mouth/Throat: Mucous membranes are moist.  Oropharynx non-erythematous. No exudate or PTA. Managing oral secretions. No trismus.  Mild hoarseness to voice.  Neck: No stridor.   Cardiovascular: Normal rate, regular rhythm. Good peripheral circulation. Grossly normal heart sounds.   Respiratory: Normal respiratory effort.  No retractions. Lungs CTAB. No wheezing.  Gastrointestinal: Soft and nontender. No distention.  Musculoskeletal: No lower extremity tenderness nor edema. No gross deformities of extremities. Neurologic:  Normal speech and language.  Skin:  Skin is warm, dry and intact. No rash noted.   ____________________________________________   LABS (all labs ordered are listed, but only abnormal results are displayed)  Labs Reviewed  RESP PANEL BY RT-PCR (FLU A&B, COVID) [id number]   ____________________________________________  RADIOLOGY  CXR reviewed.   ____________________________________________   PROCEDURES  Procedure(s) performed:   Procedures  None  ____________________________________________   INITIAL IMPRESSION / ASSESSMENT AND PLAN / ED COURSE  Pertinent labs & imaging results that were available during my care of the patient were reviewed by me and considered in my medical decision making (see chart for details).   Patient presents to the emergency department with cough and cold-like symptoms.  She sounds clinically like she may be developing a laryngitis.  She is not having sore throat or sensation of difficulty swallowing.  Seems that likely viral process is causing a mild asthma flare.  Did not appreciate any wheezing here but patient did use her nebulizer treatment prior to ED evaluation.  She may benefit from steroid burst.  We will send COVID/flu PCR which the patient will follow in the  MyChart app.  Plan for chest x-ray to rule out community-acquired pneumonia which might respond better to antibiotics. Exceedingly low suspicion for ACS or PE.   COVID and flu are negative. Plan for supportive care at home with clinical symptoms or a viral process. No hard signs on exam to prompt CT imaging or direct visualization.  ____________________________________________  FINAL CLINICAL IMPRESSION(S) / ED DIAGNOSES  Final diagnoses:  Mild intermittent asthma with exacerbation  Laryngitis     MEDICATIONS GIVEN DURING THIS VISIT:  Medications  predniSONE (DELTASONE) tablet 60 mg (60 mg Oral Given 06/29/21 0919)     NEW OUTPATIENT MEDICATIONS STARTED DURING THIS VISIT:  Discharge Medication List as of 06/29/2021 10:10 AM     START taking these medications   Details  benzonatate (TESSALON) 100 MG capsule Take 1 capsule (100 mg total) by mouth every 8 (eight) hours., Starting Fri 06/29/2021, Normal    fluticasone (FLONASE) 50 MCG/ACT nasal spray Place 2 sprays into both nostrils daily., Starting Fri 06/29/2021,  Normal    predniSONE (DELTASONE) 10 MG tablet Take 2 tablets (20 mg total) by mouth daily for 5 days., Starting Fri 06/29/2021, Until Wed 07/04/2021, Normal        Note:  This document was prepared using Dragon voice recognition software and may include unintentional dictation errors.  Nanda Quinton, MD, [facility] Emergency Medicine    Avalynne Diver, [facility], MD 07/05/21 [phone]

## 2021-06-29 NOTE — ED Notes (Signed)
First contact with patient. Patient arrived via triage from home with complaints of shortness of breath - home inhalers not helping. Patients lungs are clear bilaterally - voice is hoarse. Patient  is A&OX 4. Respirations even/unlabored. Patient changed into gown and placed on cardiac monitor and call light within reach. Patient updated on plan of care. Will continue to monitor patient.

## 2021-06-29 NOTE — ED Notes (Signed)
RT assessed patient upon arrival to room. SAT 99%, BBS clear, slightly diminished in bases. Stated she took a neb at 0700. No distress noted at this time

## 2021-07-03 ENCOUNTER — Other Ambulatory Visit: Payer: Self-pay

## 2021-07-03 ENCOUNTER — Encounter (HOSPITAL_BASED_OUTPATIENT_CLINIC_OR_DEPARTMENT_OTHER): Payer: Self-pay

## 2021-07-03 ENCOUNTER — Emergency Department (HOSPITAL_BASED_OUTPATIENT_CLINIC_OR_DEPARTMENT_OTHER): Payer: BC Managed Care – PPO

## 2021-07-03 ENCOUNTER — Emergency Department (HOSPITAL_BASED_OUTPATIENT_CLINIC_OR_DEPARTMENT_OTHER)
Admission: EM | Admit: 2021-07-03 | Discharge: 2021-07-03 | Disposition: A | Discharge status: Home / Self Care | Payer: BC Managed Care – PPO | Attending: Emergency Medicine | Admitting: Emergency Medicine

## 2021-07-03 DIAGNOSIS — R0789 Other chest pain: Secondary | ICD-10-CM | POA: Insufficient documentation

## 2021-07-03 DIAGNOSIS — J4550 Severe persistent asthma, uncomplicated: Secondary | ICD-10-CM | POA: Diagnosis not present

## 2021-07-03 DIAGNOSIS — Z91010 Allergy to peanuts: Secondary | ICD-10-CM | POA: Diagnosis not present

## 2021-07-03 DIAGNOSIS — Z87891 Personal history of nicotine dependence: Secondary | ICD-10-CM | POA: Diagnosis not present

## 2021-07-03 DIAGNOSIS — I10 Essential (primary) hypertension: Secondary | ICD-10-CM | POA: Insufficient documentation

## 2021-07-03 DIAGNOSIS — Z7951 Long term (current) use of inhaled steroids: Secondary | ICD-10-CM | POA: Insufficient documentation

## 2021-07-03 DIAGNOSIS — Z79899 Other long term (current) drug therapy: Secondary | ICD-10-CM | POA: Diagnosis not present

## 2021-07-03 DIAGNOSIS — R0602 Shortness of breath: Secondary | ICD-10-CM | POA: Diagnosis not present

## 2021-07-03 DIAGNOSIS — R072 Precordial pain: Secondary | ICD-10-CM

## 2021-07-03 LAB — CBC WITH DIFFERENTIAL/PLATELET
Abs Immature Granulocytes: 0.02 10*3/uL (ref 0.00–0.07)
Basophils Absolute: 0 10*3/uL (ref 0.0–0.1)
Basophils Relative: 0 %
Eosinophils Absolute: 0 10*3/uL (ref 0.0–0.5)
Eosinophils Relative: 0 %
HCT: 42.5 % (ref 36.0–46.0)
Hemoglobin: 13.6 g/dL (ref 12.0–15.0)
Immature Granulocytes: 0 %
Lymphocytes Relative: 14 %
Lymphs Abs: 1 10*3/uL (ref 0.7–4.0)
MCH: 26.9 pg (ref 26.0–34.0)
MCHC: 32 g/dL (ref 30.0–36.0)
MCV: 84 fL (ref 80.0–100.0)
Monocytes Absolute: 0.2 10*3/uL (ref 0.1–1.0)
Monocytes Relative: 2 %
Neutro Abs: 5.5 10*3/uL (ref 1.7–7.7)
Neutrophils Relative %: 84 %
Platelets: 376 10*3/uL (ref 150–400)
RBC: 5.06 MIL/uL (ref 3.87–5.11)
RDW: 14.3 % (ref 11.5–15.5)
WBC: 6.6 10*3/uL (ref 4.0–10.5)
nRBC: 0 % (ref 0.0–0.2)

## 2021-07-03 LAB — COMPREHENSIVE METABOLIC PANEL
ALT: 16 U/L (ref 0–44)
AST: 18 U/L (ref 15–41)
Albumin: 5.1 g/dL — ABNORMAL HIGH (ref 3.5–5.0)
Alkaline Phosphatase: 60 U/L (ref 38–126)
Anion gap: 11 (ref 5–15)
BUN: 10 mg/dL (ref 6–20)
CO2: 25 mmol/L (ref 22–32)
Calcium: 9.9 mg/dL (ref 8.9–10.3)
Chloride: 100 mmol/L (ref 98–111)
Creatinine, Ser: 0.74 mg/dL (ref 0.44–1.00)
GFR, Estimated: >60 mL/min (ref >60)
Glucose, Bld: 114 mg/dL — ABNORMAL HIGH (ref 70–99)
Potassium: 3.7 mmol/L (ref 3.5–5.1)
Sodium: 136 mmol/L (ref 135–145)
Total Bilirubin: 0.5 mg/dL (ref 0.3–1.2)
Total Protein: 8.8 g/dL — ABNORMAL HIGH (ref 6.5–8.1)

## 2021-07-03 LAB — HCG, SERUM, QUALITATIVE: Preg, Serum: NEGATIVE

## 2021-07-03 LAB — D-DIMER, QUANTITATIVE: D-Dimer, Quant: <0.27 ug/mL-FEU (ref 0.00–0.50)

## 2021-07-03 LAB — TROPONIN I (HIGH SENSITIVITY)
Troponin I (High Sensitivity): 3 ng/L (ref <18)
Troponin I (High Sensitivity): 2 ng/L (ref <18)

## 2021-07-03 LAB — LIPASE, BLOOD: Lipase: 26 U/L (ref 11–51)

## 2021-07-03 MED ORDER — ASPIRIN 81 MG PO CHEW
324.0000 mg | CHEWABLE_TABLET | Freq: Once | ORAL | Status: AC
  Administered 2021-07-03: 324 mg via ORAL
  Filled 2021-07-03: qty 4

## 2021-07-03 MED ORDER — ALUM & MAG HYDROXIDE-SIMETH 200-200-20 MG/5ML PO SUSP
30.0000 mL | Freq: Once | ORAL | Status: AC
  Administered 2021-07-03: 30 mL via ORAL
  Filled 2021-07-03: qty 30

## 2021-07-03 MED ORDER — CYCLOBENZAPRINE HCL 10 MG PO TABS
10.0000 mg | ORAL_TABLET | Freq: Once | ORAL | Status: AC
  Administered 2021-07-03: 10 mg via ORAL
  Filled 2021-07-03: qty 1

## 2021-07-03 NOTE — Discharge Instructions (Addendum)
You came to the emergency department today to be evaluated for your chest pain.  Your physical exam was reassuring.  X-ray obtained showed no signs of pneumonia or other abnormalities with your lungs.  Your EKG and lab work were reassuring that you are not having a heart attack today.  Please follow-up with your primary care provider in the next few days.  While in the emergency department your blood pressure was found to be elevated.  Please make sure you discuss this when you see your primary care provider.  Get help right away if: Your chest pain gets worse. You have a cough that gets worse, or you cough up blood. You have severe pain in your abdomen. You faint. You have sudden, unexplained chest discomfort. You have sudden, unexplained discomfort in your arms, back, neck, or jaw. You have shortness of breath at any time. You suddenly start to sweat, or your skin gets clammy. You feel nausea or you vomit. You suddenly feel lightheaded or dizzy. You have severe weakness, or unexplained weakness or fatigue. Your heart begins to beat quickly, or it feels like it is skipping beats.

## 2021-07-03 NOTE — ED Notes (Signed)
D/c paperwork reviewed with pt, including f/u care. All questions answered prior to d/c. Pt ambulatory to ED exit, NAD

## 2021-07-03 NOTE — ED Triage Notes (Signed)
L sided chest pain since about 1 pm today. Pt also endorses SHOB. Took her inhaler with no relief. Pt states that it feels like a gas bubble.

## 2021-07-03 NOTE — ED Provider Notes (Signed)
[facility] EMERGENCY DEPARTMENT Provider Note   CSN: [id number] Arrival date & time: 07/03/21  1737     History No chief complaint on file.   Kimberly Ali is a 45 y.o. female with a history of hypertension, moderate persistent asthma.  Patient presents with a chief complaint of chest pain.  Pain is located under her left breast.  Pain started today at 1300.  Pain has been constant since then.  Patient reports that pain feels like a "gas bubble."  Pain does not radiate.  Patient does endorse that she has had some similar pain to her back intermittently since 1300.  Patient denies any aggravating factors.  Patient has not tried any modalities to alleviate her symptoms.  Patient reports that she has had intermittent shortness of breath and chest tightness since 1300 today however this is consistent with her known asthma.  Patient denies any nausea, vomiting, or diaphoresis.  Patient denies any history of DVT or PE, hormone use, surgery last 4 weeks, hemoptysis, lateral leg swelling or tenderness, prolonged immobilization, fever, chills, rhinorrhea, nasal congestion, sore throat, cough, abdominal pain, lightheadedness, syncope, palpitations.  HPI     Past Medical History:  Diagnosis Date   Allergy    hayfever   Anal skin tag    Eczema    Headache    History of hay fever    History of migraine    Hypertension    Moderate persistent asthma 12/20/2016   Seizures ([place])    11-25-19- one time occurence, asthma induced, none since   Urticaria     Patient Active Problem List   Diagnosis Date Noted   OSA (obstructive sleep apnea) 07/17/2020   Healthcare maintenance 07/17/2020   Seasonal and perennial allergic rhinoconjunctivitis 03/20/2020   Severe persistent asthma without complication 14/48/1856   Adverse food reaction 02/17/2020   Other atopic dermatitis 02/17/2020   Cervical spondylosis 09/03/2019   Vestibular migraine 08/18/2019   Moderate persistent asthma  12/20/2016    Past Surgical History:  Procedure Laterality Date   ABDOMINAL HYSTERECTOMY     Fibroids   CERVIX SURGERY     x2   DILATION AND CURETTAGE OF UTERUS     EXCISION OF SKIN TAG N/A 07/23/2017   Procedure: EXCISION OF SKIN TAG;  Surgeon: Clovis Riley, MD;  Location: [facility];  Service: General;  Laterality: N/A;   HEMORRHOID SURGERY  2018   states laser surgery for one hemorrhoid     OB History     Gravida  7   Para  5   Term  4   Preterm  1   AB  2   Living  4      SAB  2   IAB  0   Ectopic  0   Multiple  1   Live Births  4           Family History  Problem Relation Age of Onset   Cancer Father        prostate   Hypertension Father    Diabetes Father    Allergic rhinitis Father    Alcohol abuse Maternal Grandmother    Alcohol abuse Maternal Grandfather    Asthma Neg Hx    Urticaria Neg Hx    Eczema Neg Hx    Colon cancer Neg Hx    Colon polyps Neg Hx    Esophageal cancer Neg Hx    Rectal cancer Neg Hx    Stomach cancer  Neg Hx     Social History   Tobacco Use   Smoking status: Former    Packs/day: 0.25    Types: Cigarettes    Quit date: 12/16/2002    Years since quitting: 18.5   Smokeless tobacco: Never   Tobacco comments:    > 16 yrs ago, 1 pack would last her a week  Vaping Use   Vaping Use: Never used  Substance Use Topics   Alcohol use: Yes    Alcohol/week: 1.0 standard drink    Types: 1 Glasses of wine per week    Comment: social    Drug use: No    Home Medications Prior to Admission medications   Medication Sig Start Date End Date Taking? Authorizing Provider  albuterol (VENTOLIN HFA) 108 (90 Base) MCG/ACT inhaler Inhale 1-2 puffs into the lungs every 6 (six) hours as needed for wheezing or shortness of breath. 01/11/21   Shelda Pal, DO  amLODipine (NORVASC) 5 MG tablet TAKE 1 TABLET BY MOUTH EVERY DAY 06/26/20   Shelda Pal, DO  Azelastine-Fluticasone 137-50 MCG/ACT  SUSP Place 1 spray into the nose in the morning and at bedtime. Patient not taking: No sig reported 02/17/20   Garnet Sierras, DO  benzonatate (TESSALON) 100 MG capsule Take 1 capsule (100 mg total) by mouth every 8 (eight) hours. 06/29/21   Long, Wonda Olds, MD  desonide (DESOWEN) 0.05 % ointment Apply 1 application topically 2 (two) times daily. Use twice a day if needed on eczema flares. Stop when it clears up. 09/27/20   Shelda Pal, DO  EPINEPHrine 0.3 mg/0.3 mL IJ SOAJ injection Inject 0.3 mg into the muscle as needed for anaphylaxis. 09/27/20   Shelda Pal, DO  fluticasone (FLONASE) 50 MCG/ACT nasal spray Place 2 sprays into both nostrils daily. 06/29/21   Long, Wonda Olds, MD  fluticasone (FLOVENT HFA) 110 MCG/ACT inhaler Inhale 2 puffs into the lungs in the morning and at bedtime. Patient not taking: No sig reported 01/09/21   Shelda Pal, DO  fluticasone furoate-vilanterol (BREO ELLIPTA) 200-25 MCG/INH AEPB TAKE 1 PUFF BY MOUTH EVERY DAY 09/01/20   Shelda Pal, DO  hydrocortisone 2.5 % cream Apply topically 2 (two) times daily. Patient not taking: No sig reported 07/10/20   Shelda Pal, DO  levocetirizine (XYZAL) 5 MG tablet TAKE 1 TABLET BY MOUTH EVERY DAY IN THE EVENING Patient not taking: Reported on 04/25/2021 12/07/20   Shelda Pal, DO  meloxicam (MOBIC) 15 MG tablet Take 1 tablet (15 mg total) by mouth daily. Patient not taking: No sig reported 10/09/20   Shelda Pal, DO  metroNIDAZOLE (FLAGYL) 500 MG tablet Take 1 tablet (500 mg total) by mouth 2 (two) times daily. 04/27/21   Truett Mainland, DO  montelukast (SINGULAIR) 10 MG tablet TAKE 1 TABLET BY MOUTH EVERYDAY AT BEDTIME 08/22/20   Wendling, Crosby Oyster, DO  naproxen sodium (ALEVE) 220 MG tablet Take 440 mg by mouth as needed (pain).    [provider]  Olopatadine HCl 0.2 % SOLN Apply 1 drop to eye daily as needed (itchy/watery eyes). Patient not  taking: Reported on 04/25/2021 02/17/20   Garnet Sierras, DO  ondansetron (ZOFRAN ODT) 8 MG disintegrating tablet Take 1 tablet (8 mg total) by mouth every 8 (eight) hours as needed for nausea or vomiting. Patient not taking: No sig reported 12/30/20   Dorie Rank, MD  predniSONE (DELTASONE) 10 MG tablet Take 2 tablets (20  mg total) by mouth daily for 5 days. 06/29/21 07/04/21  Long, Wonda Olds, MD  Spacer/Aero-Hold Chamber Mask MISC Use with inhaler 12/28/19   Shelda Pal, DO    Allergies    Eggs or egg-derived products, Haemophilus influenzae vaccines, Peanut-containing drug products, Almond (diagnostic), and Dust mite extract  Review of Systems   Review of Systems  Constitutional:  Negative for chills, diaphoresis and fever.  HENT:  Negative for congestion, rhinorrhea and sore throat.   Eyes:  Negative for visual disturbance.  Respiratory:  Positive for chest tightness and shortness of breath.   Cardiovascular:  Positive for chest pain. Negative for palpitations and leg swelling.  Gastrointestinal:  Negative for abdominal pain, nausea and vomiting.  Genitourinary:  Negative for difficulty urinating and dysuria.  Musculoskeletal:  Negative for back pain and neck pain.  Skin:  Negative for color change and rash.  Neurological:  Negative for dizziness, syncope, light-headedness and headaches.  Psychiatric/Behavioral:  Negative for confusion.    Physical Exam Updated Vital Signs There were no vitals taken for this visit.  Physical Exam Vitals and nursing note reviewed. Exam conducted with a chaperone present (Female RN present as chaperone).  Constitutional:      General: She is not in acute distress.    Appearance: She is not ill-appearing, toxic-appearing or diaphoretic.  HENT:     Head: Normocephalic.  Eyes:     General: No scleral icterus.       Right eye: No discharge.        Left eye: No discharge.  Cardiovascular:     Rate and Rhythm: Normal rate.     Pulses:           Radial pulses are 2+ on the right side and 2+ on the left side.       Femoral pulses are 2+ on the right side and 2+ on the left side. Pulmonary:     Effort: Pulmonary effort is normal. No tachypnea, bradypnea or respiratory distress.     Breath sounds: Normal breath sounds. No stridor.  Abdominal:     General: There is no distension. There are no signs of injury.     Palpations: Abdomen is soft. There is no mass or pulsatile mass.     Tenderness: There is no abdominal tenderness. There is no guarding or rebound.     Hernia: There is no hernia in the umbilical area or ventral area.  Musculoskeletal:     Right lower leg: No swelling or tenderness. No edema.     Left lower leg: No swelling or tenderness. No edema.  Skin:    General: Skin is warm and dry.  Neurological:     General: No focal deficit present.     Mental Status: She is alert and oriented to person, place, and time.     GCS: GCS eye subscore is 4. GCS verbal subscore is 5. GCS motor subscore is 6.  Psychiatric:        Behavior: Behavior is cooperative.    ED Results / Procedures / Treatments   Labs (all labs ordered are listed, but only abnormal results are displayed) Labs Reviewed  COMPREHENSIVE METABOLIC PANEL - Abnormal; Notable for the following components:      Result Value   Glucose, Bld 114 (*)    Total Protein 8.8 (*)    Albumin 5.1 (*)    All other components within normal limits  D-DIMER, QUANTITATIVE  CBC WITH DIFFERENTIAL/PLATELET  HCG, SERUM, QUALITATIVE  LIPASE, BLOOD  TROPONIN I (HIGH SENSITIVITY)  TROPONIN I (HIGH SENSITIVITY)    EKG EKG Interpretation  Date/Time:  Tuesday July 03 2021 17:45:33 EST Ventricular Rate:  75 PR Interval:  145 QRS Duration: 82 QT Interval:  374 QTC Calculation: 418 R Axis:   90 Text Interpretation: Sinus rhythm Borderline right axis deviation Confirmed by Wynona Dove ([id number]) on 07/03/2021 5:50:11 PM  Radiology DG Chest Portable 1 View  Result Date:  07/03/2021 CLINICAL DATA:  Chest pain EXAM: PORTABLE CHEST 1 VIEW COMPARISON:  06/29/2021 FINDINGS: Cardiac and mediastinal contours are within normal limits. No focal pulmonary opacity. No pleural effusion or pneumothorax. No acute osseous abnormality. IMPRESSION: No acute cardiopulmonary process. Electronically Signed   By: Merilyn Baba M.D.   On: 07/03/2021 18:54    Procedures Procedures   Medications Ordered in ED Medications  aspirin chewable tablet 324 mg (324 mg Oral Given 07/03/21 1804)  alum & mag hydroxide-simeth (MAALOX/MYLANTA) 200-200-20 MG/5ML suspension 30 mL (30 mLs Oral Given 07/03/21 1804)  cyclobenzaprine (FLEXERIL) tablet 10 mg (10 mg Oral Given 07/03/21 1913)    ED Course  I have reviewed the triage vital signs and the nursing notes.  Pertinent labs & imaging results that were available during my care of the patient were reviewed by me and considered in my medical decision making (see chart for details).    MDM Rules/Calculators/A&P                           Alert 45 year old female no acute distress, nontoxic-appearing.  Presents emergency department chief complaint of chest pain.  Will initiate ACS work-up at this time.  Patient given aspirin.  patient has +2 radial and femoral pulses bilaterally.  Patient reports of pain as a "gas bubble."  We will give patient GI cocktail.  Patient reports history of hypertension.  States that she takes amlodipine.  Patient reports taking this medication earlier today.  Patient was noted to have blood pressure cuff that was too small for her arm.  After replacement blood pressure improved.  We will have patient follow-up with PCP in outpatient setting for reassessment of blood pressure.  D-dimer within normal limits low suspicion for PE at this time. CBC, CMP, lipase are unremarkable.  Chest x-ray shows no active cardiopulmonary disease EKG shows sinus rhythm with borderline right axis deviation Troponin 3, heart score 1. Will  obtain second troponin, if within normal limits plan to discharge patient to follow-up with PCP. Patient reports that she did have improvement in chest pain after receiving Flexeril.  Patient care transferred to [facility] at the end of my shift. Patient presentation, ED course, and plan of care discussed with review of all pertinent labs and imaging. Please see his/her note for further details regarding further ED course and disposition.    Final Clinical Impression(s) / ED Diagnoses Final diagnoses:  None    Rx / DC Orders ED Discharge Orders     None        Loni Beckwith, PA-C 07/03/21 2002    Wynona Dove A, DO 07/04/21 0002

## 2021-07-04 ENCOUNTER — Ambulatory Visit (INDEPENDENT_AMBULATORY_CARE_PROVIDER_SITE_OTHER): Payer: BC Managed Care – PPO | Admitting: Family Medicine

## 2021-07-04 ENCOUNTER — Encounter: Payer: Self-pay | Admitting: Family Medicine

## 2021-07-04 VITALS — BP 108/64 | HR 86 | Temp 98.6°F | Ht 65.0 in | Wt 153.2 lb

## 2021-07-04 DIAGNOSIS — F411 Generalized anxiety disorder: Secondary | ICD-10-CM | POA: Diagnosis not present

## 2021-07-04 MED ORDER — CITALOPRAM HYDROBROMIDE 20 MG PO TABS: 20.0000 mg | ORAL_TABLET | Freq: Every day | ORAL | 0 refills | Status: DC

## 2021-07-04 NOTE — Patient Instructions (Signed)
Stay with the counseling team.   Keep the diet clean and stay active.  Aim to do some physical exertion for 150 minutes per week. This is typically divided into 5 days per week, 30 minutes per day. The activity should be enough to get your heart rate up. Anything is better than nothing if you have time constraints.  Coping skills Choose 5 that work for you: Take a deep breath Count to 20 Read a book Do a puzzle Meditate Bake Sing Knit Garden Pray Go outside Call a friend Listen to music Take a walk Color Send a note Take a bath Watch a movie Be alone in a quiet place Pet an animal Visit a friend Journal Exercise Stretch

## 2021-07-04 NOTE — Progress Notes (Signed)
Chief Complaint  Patient presents with   Follow-up    ER Asthma Blood pressure stress    Subjective Kimberly Ali presents for f/u anxiety.  Pt is currently not on any pharmacotherapy. Her job is quite stressful.  She works long hours and ends up dreaming about her work as well.  It is affecting her sleep, raising her blood pressure as she was going to the emergency department because of it, and significantly increasing anxiety/stress. No thoughts of harming self or others. No self-medication with alcohol, prescription drugs or illicit drugs. Pt is following with a counselor/psychologist.  Past Medical History:  Diagnosis Date   Allergy    hayfever   Anal skin tag    Eczema    Headache    History of hay fever    History of migraine    Hypertension    Moderate persistent asthma 12/20/2016   Seizures ([place])    11-25-19- one time occurence, asthma induced, none since   Urticaria    Allergies as of 07/04/2021       Reactions   Eggs Or Egg-derived Products    Causes nausea and vomiting   Haemophilus Influenzae Vaccines Anaphylaxis   Peanut-containing Drug Products Anaphylaxis   Almond (diagnostic) Itching, Swelling   Dust Mite Extract         Medication List        Accurate as of July 04, 2021  3:13 PM. If you have any questions, ask your nurse or doctor.          STOP taking these medications    meloxicam 15 MG tablet Commonly known as: MOBIC   metroNIDAZOLE 500 MG tablet Commonly known as: FLAGYL   naproxen sodium 220 MG tablet Commonly known as: ALEVE   ondansetron 8 MG disintegrating tablet Commonly known as: Zofran ODT       TAKE these medications    albuterol 108 (90 Base) MCG/ACT inhaler Commonly known as: VENTOLIN HFA Inhale 1-2 puffs into the lungs every 6 (six) hours as needed for wheezing or shortness of breath.   amLODipine 5 MG tablet Commonly known as: NORVASC TAKE 1 TABLET BY MOUTH EVERY DAY   Azelastine-Fluticasone  137-50 MCG/ACT Susp Place 1 spray into the nose in the morning and at bedtime.   benzonatate 100 MG capsule Commonly known as: TESSALON Take 1 capsule (100 mg total) by mouth every 8 (eight) hours.   Breo Ellipta 200-25 MCG/ACT Aepb Generic drug: fluticasone furoate-vilanterol TAKE 1 PUFF BY MOUTH EVERY DAY   citalopram 20 MG tablet Commonly known as: CELEXA Take 1 tablet (20 mg total) by mouth daily. Take 1/2 tab daily for the first 2 weeks.   desonide 0.05 % ointment Commonly known as: DESOWEN Apply 1 application topically 2 (two) times daily. Use twice a day if needed on eczema flares. Stop when it clears up.   EPINEPHrine 0.3 mg/0.3 mL Soaj injection Commonly known as: EPI-PEN Inject 0.3 mg into the muscle as needed for anaphylaxis.   Flovent HFA 110 MCG/ACT inhaler Generic drug: fluticasone Inhale 2 puffs into the lungs in the morning and at bedtime.   fluticasone 50 MCG/ACT nasal spray Commonly known as: FLONASE Place 2 sprays into both nostrils daily.   hydrocortisone 2.5 % cream Apply topically 2 (two) times daily.   levocetirizine 5 MG tablet Commonly known as: XYZAL TAKE 1 TABLET BY MOUTH EVERY DAY IN THE EVENING   montelukast 10 MG tablet Commonly known as: SINGULAIR TAKE 1 TABLET BY MOUTH  EVERYDAY AT BEDTIME   Olopatadine HCl 0.2 % Soln Apply 1 drop to eye daily as needed (itchy/watery eyes).   predniSONE 10 MG tablet Commonly known as: DELTASONE Take 2 tablets (20 mg total) by mouth daily for 5 days.   Spacer/Aero-Hold Ship broker Use with inhaler        Exam BP 108/64   Pulse 86   Temp 98.6 F (37 C) (Oral)   Ht 5\' 5"  (1.651 m)   Wt 153 lb 4 oz (69.5 kg)   SpO2 94%   BMI 25.50 kg/m  General:  well developed, well nourished, in no apparent distress Lungs:  No respiratory distress Psych: well oriented with normal range of affect and age-appropriate judgement/insight, alert and oriented x4.  Assessment and Plan  GAD (generalized  anxiety disorder) - Plan: citalopram (CELEXA) 20 MG tablet  Chronic, unstable.  Start Celexa 10 mg daily for 2 weeks and then increase to 20 mg daily.  Continue with counseling team.  Counseled on exercise.  We will fill out FMLA when she gets [place] the forms. FMLA-11/7 start, continuous leave. Increasing blood pressure, affecting concentration and overall well being. 2 month duration.  F/u in 1 mo. The patient voiced understanding and agreement to the plan.  [place], DO 07/04/21 3:13 PM

## 2021-07-09 NOTE — Telephone Encounter (Signed)
Patient dropped of forms to be filled out by wendling  Placed into bin up front  Patient would like papers faxed [phone]

## 2021-07-10 ENCOUNTER — Ambulatory Visit
Admission: RE | Admit: 2021-07-10 | Discharge: 2021-07-10 | Disposition: A | Discharge status: Home / Self Care | Payer: BC Managed Care – PPO | Source: Ambulatory Visit | Attending: Family Medicine | Admitting: Family Medicine

## 2021-07-10 ENCOUNTER — Other Ambulatory Visit: Payer: Self-pay | Admitting: Family Medicine

## 2021-07-10 DIAGNOSIS — N6310 Unspecified lump in the right breast, unspecified quadrant: Secondary | ICD-10-CM

## 2021-07-17 ENCOUNTER — Ambulatory Visit
Admission: RE | Admit: 2021-07-17 | Discharge: 2021-07-17 | Disposition: A | Discharge status: Home / Self Care | Payer: BC Managed Care – PPO | Source: Ambulatory Visit | Attending: Family Medicine | Admitting: Family Medicine

## 2021-07-17 ENCOUNTER — Other Ambulatory Visit: Payer: Self-pay | Admitting: Radiology

## 2021-07-17 ENCOUNTER — Other Ambulatory Visit: Payer: Self-pay

## 2021-07-17 DIAGNOSIS — N6310 Unspecified lump in the right breast, unspecified quadrant: Secondary | ICD-10-CM

## 2021-07-23 ENCOUNTER — Encounter: Payer: Self-pay | Admitting: General Practice

## 2021-07-29 ENCOUNTER — Other Ambulatory Visit: Payer: Self-pay | Admitting: Family Medicine

## 2021-07-29 DIAGNOSIS — F411 Generalized anxiety disorder: Secondary | ICD-10-CM

## 2021-07-31 ENCOUNTER — Ambulatory Visit (INDEPENDENT_AMBULATORY_CARE_PROVIDER_SITE_OTHER): Payer: BC Managed Care – PPO | Admitting: Family Medicine

## 2021-07-31 ENCOUNTER — Encounter: Payer: Self-pay | Admitting: Family Medicine

## 2021-07-31 ENCOUNTER — Other Ambulatory Visit: Payer: Self-pay | Admitting: Family Medicine

## 2021-07-31 VITALS — BP 120/68 | HR 83 | Temp 98.3°F | Ht 65.0 in | Wt 158.0 lb

## 2021-07-31 DIAGNOSIS — Z1159 Encounter for screening for other viral diseases: Secondary | ICD-10-CM

## 2021-07-31 DIAGNOSIS — I10 Essential (primary) hypertension: Secondary | ICD-10-CM | POA: Diagnosis not present

## 2021-07-31 DIAGNOSIS — Z23 Encounter for immunization: Secondary | ICD-10-CM

## 2021-07-31 DIAGNOSIS — Z0000 Encounter for general adult medical examination without abnormal findings: Secondary | ICD-10-CM | POA: Diagnosis not present

## 2021-07-31 MED ORDER — AMLODIPINE BESYLATE 5 MG PO TABS: 5.0000 mg | ORAL_TABLET | Freq: Every day | ORAL | 10 refills | Status: DC

## 2021-07-31 NOTE — Telephone Encounter (Signed)
Appt today at 2:30pm.

## 2021-07-31 NOTE — Progress Notes (Signed)
Chief Complaint  Patient presents with   Annual Exam     Well Woman Kimberly Ali is here for a complete physical.   Her last physical was >1 year ago.  Current diet: in general, diet is healthy overall. Current exercise: walking. Weight is stable and she denies fatigue out of ordinary. Seatbelt? Yes Advanced directive?  Health Maintenance Pap/HPV- Yes Mammogram- Yes Tetanus- Yes CCS- No Hep C screening- No HIV screening- Yes  Past Medical History:  Diagnosis Date   Allergy    hayfever   Anal skin tag    Eczema    Headache    History of hay fever    History of migraine    Hypertension    Moderate persistent asthma 12/20/2016   Seizures ([place])    11-25-19- one time occurence, asthma induced, none since   Urticaria      Past Surgical History:  Procedure Laterality Date   ABDOMINAL HYSTERECTOMY     Fibroids   CERVIX SURGERY     x2   DILATION AND CURETTAGE OF UTERUS     EXCISION OF SKIN TAG N/A 07/23/2017   Procedure: EXCISION OF SKIN TAG;  Surgeon: Clovis Riley, MD;  Location: [facility];  Service: General;  Laterality: N/A;   New Site  2018   states laser surgery for one hemorrhoid    Medications  Current Outpatient Medications on File Prior to Visit  Medication Sig Dispense Refill   albuterol (VENTOLIN HFA) 108 (90 Base) MCG/ACT inhaler Inhale 1-2 puffs into the lungs every 6 (six) hours as needed for wheezing or shortness of breath. 18 g 5   Azelastine-Fluticasone 137-50 MCG/ACT SUSP Place 1 spray into the nose in the morning and at bedtime. 23 g 5   benzonatate (TESSALON) 100 MG capsule Take 1 capsule (100 mg total) by mouth every 8 (eight) hours. 21 capsule 0   citalopram (CELEXA) 20 MG tablet TAKE 1 TABLET (20 MG TOTAL) BY MOUTH DAILY. TAKE 1/2 TAB DAILY FOR THE FIRST 2 WEEKS. 90 tablet 1   desonide (DESOWEN) 0.05 % ointment Apply 1 application topically 2 (two) times daily. Use twice a day if needed on eczema flares.  Stop when it clears up. 60 g 1   EPINEPHrine 0.3 mg/0.3 mL IJ SOAJ injection Inject 0.3 mg into the muscle as needed for anaphylaxis. 2 each 2   fluticasone (FLONASE) 50 MCG/ACT nasal spray Place 2 sprays into both nostrils daily. 6.7 g 0   fluticasone (FLOVENT HFA) 110 MCG/ACT inhaler Inhale 2 puffs into the lungs in the morning and at bedtime. 12 each 5   fluticasone furoate-vilanterol (BREO ELLIPTA) 200-25 MCG/INH AEPB TAKE 1 PUFF BY MOUTH EVERY DAY 180 each 2   hydrocortisone 2.5 % cream Apply topically 2 (two) times daily. 30 g 0   levocetirizine (XYZAL) 5 MG tablet TAKE 1 TABLET BY MOUTH EVERY DAY IN THE EVENING 30 tablet 2   montelukast (SINGULAIR) 10 MG tablet TAKE 1 TABLET BY MOUTH EVERYDAY AT BEDTIME 30 tablet 5   Olopatadine HCl 0.2 % SOLN Apply 1 drop to eye daily as needed (itchy/watery eyes). 2.5 mL 5   Spacer/Aero-Hold Chamber Mask MISC Use with inhaler 1 each 0    Allergies Allergies  Allergen Reactions   Eggs Or Egg-Derived Products     Causes nausea and vomiting   Haemophilus Influenzae Vaccines Anaphylaxis   Peanut-Containing Drug Products Anaphylaxis   Almond (Diagnostic) Itching and Swelling   Dust Mite Extract  Review of Systems: Constitutional:  no unexpected weight changes Eye:  no recent significant change in vision Ear/Nose/Mouth/Throat:  Ears:  no recent change in hearing Nose/Mouth/Throat:  no complaints of nasal congestion, no sore throat Cardiovascular: no chest pain Respiratory:  no shortness of breath Gastrointestinal:  no abdominal pain, no change in bowel habits GU:  Female: negative for dysuria or pelvic pain Musculoskeletal/Extremities:  no pain of the joints Integumentary (Skin/Breast):  no abnormal skin lesions reported Neurologic:  no headaches Endocrine:  denies fatigue Hematologic/Lymphatic:  No areas of easy bleeding  Exam BP 120/68   Pulse 83   Temp 98.3 F (36.8 C) (Oral)   Ht 5\' 5"  (1.651 m)   Wt 158 lb (71.7 kg)   SpO2 99%    BMI 26.29 kg/m  General:  well developed, well nourished, in no apparent distress Skin:  no significant moles, warts, or growths Head:  no masses, lesions, or tenderness Eyes:  pupils equal and round, sclera anicteric without injection Ears:  canals without lesions, TMs shiny without retraction, no obvious effusion, no erythema Nose:  nares patent, septum midline, mucosa normal, and no drainage or sinus tenderness Throat/Pharynx:  lips and gingiva without lesion; tongue and uvula midline; non-inflamed pharynx; no exudates or postnasal drainage Neck: neck supple without adenopathy, thyromegaly, or masses Lungs:  clear to auscultation, breath sounds equal bilaterally, no respiratory distress Cardio:  regular rate and rhythm, no LE edema Abdomen:  abdomen soft, nontender; bowel sounds normal; no masses or organomegaly Genital: Defer to GYN Musculoskeletal:  symmetrical muscle groups noted without atrophy or deformity Extremities:  no clubbing, cyanosis, or edema, no deformities, no skin discoloration Neuro:  gait normal; deep tendon reflexes normal and symmetric Psych: well oriented with normal range of affect and appropriate judgment/insight  Assessment and Plan  Well adult exam - Plan: CBC, Comprehensive metabolic panel, Lipid panel  Hypertension, unspecified type - Plan: amLODipine (NORVASC) 5 MG tablet  Encounter for hepatitis C screening test for low risk patient - Plan: Hepatitis C antibody   Well 45 y.o. female. Counseled on diet and exercise. Other orders as above. PCV20 today.  Bivalent covid booster rec'd. Advanced directive form provided.  Advised she reach out to GI team again to reschedule appt for CCS.  Follow up in 6 mo or prn. The patient voiced understanding and agreement to the plan.  [place], DO 07/31/21 3:03 PM

## 2021-07-31 NOTE — Addendum Note (Signed)
Addended by: Sharon Seller B on: 07/31/2021 03:07 PM   Modules accepted: Orders

## 2021-07-31 NOTE — Patient Instructions (Addendum)
Give [place] 2-3 business days to get the results of your labs back.   Keep the diet clean and stay active.  Call the GI team to reschedule your colonoscopy.   I recommend getting the updated bivalent covid vaccination booster at your convenience.   Heat (pad or rice pillow in microwave) over affected area, 10-15 minutes twice daily.   Ice/cold pack over area for 10-15 min twice daily.  Please get [place] a copy of your advanced directive at your convenience.   Let us know if you need anything.  EXERCISES  RANGE OF MOTION (ROM) AND STRETCHING EXERCISES - Low Back Pain Most people with lower back pain will find that their symptoms get worse with excessive bending forward (flexion) or arching at the lower back (extension). The exercises that will help resolve your symptoms will focus on the opposite motion.  If you have pain, numbness or tingling which travels down into your buttocks, leg or foot, the goal of the therapy is for these symptoms to move closer to your back and eventually resolve. Sometimes, these leg symptoms will get better, but your lower back pain may worsen. This is often an indication of progress in your rehabilitation. Be very alert to any changes in your symptoms and the activities in which you participated in the 24 hours prior to the change. Sharing this information with your caregiver will allow him or her to most efficiently treat your condition. These exercises may help you when beginning to rehabilitate your injury. Your symptoms may resolve with or without further involvement from your physician, physical therapist or athletic trainer. While completing these exercises, remember:  Restoring tissue flexibility helps normal motion to return to the joints. This allows healthier, less painful movement and activity. An effective stretch should be held for at least 30 seconds. A stretch should never be painful. You should only feel a gentle lengthening or release in the stretched  tissue. FLEXION RANGE OF MOTION AND STRETCHING EXERCISES:  STRETCH - Flexion, Single Knee to Chest  Lie on a firm bed or floor with both legs extended in front of you. Keeping one leg in contact with the floor, bring your opposite knee to your chest. Hold your leg in place by either grabbing behind your thigh or at your knee. Pull until you feel a gentle stretch in your low back. Hold 30 seconds. Slowly release your grasp and repeat the exercise with the opposite side. Repeat 2 times. Complete this exercise 3 times per week.   STRETCH - Flexion, Double Knee to Chest Lie on a firm bed or floor with both legs extended in front of you. Keeping one leg in contact with the floor, bring your opposite knee to your chest. Tense your stomach muscles to support your back and then lift your other knee to your chest. Hold your legs in place by either grabbing behind your thighs or at your knees. Pull both knees toward your chest until you feel a gentle stretch in your low back. Hold 30 seconds. Tense your stomach muscles and slowly return one leg at a time to the floor. Repeat 2 times. Complete this exercise 3 times per week.   STRETCH - Low Trunk Rotation Lie on a firm bed or floor. Keeping your legs in front of you, bend your knees so they are both pointed toward the ceiling and your feet are flat on the floor. Extend your arms out to the side. This will stabilize your upper body by keeping your shoulders  in contact with the floor. Gently and slowly drop both knees together to one side until you feel a gentle stretch in your low back. Hold for 30 seconds. Tense your stomach muscles to support your lower back as you bring your knees back to the starting position. Repeat the exercise to the other side. Repeat 2 times. Complete this exercise at least 3 times per week.   EXTENSION RANGE OF MOTION AND FLEXIBILITY EXERCISES:  STRETCH - Extension, Prone on Elbows  Lie on your stomach on the floor, a bed  will be too soft. Place your palms about shoulder width apart and at the height of your head. Place your elbows under your shoulders. If this is too painful, stack pillows under your chest. Allow your body to relax so that your hips drop lower and make contact more completely with the floor. Hold this position for 30 seconds. Slowly return to lying flat on the floor. Repeat 2 times. Complete this exercise 3 times per week.   RANGE OF MOTION - Extension, Prone Press Ups Lie on your stomach on the floor, a bed will be too soft. Place your palms about shoulder width apart and at the height of your head. Keeping your back as relaxed as possible, slowly straighten your elbows while keeping your hips on the floor. You may adjust the placement of your hands to maximize your comfort. As you gain motion, your hands will come more underneath your shoulders. Hold this position 30 seconds. Slowly return to lying flat on the floor. Repeat 2 times. Complete this exercise 3 times per week.   RANGE OF MOTION- Quadruped, Neutral Spine  Assume a hands and knees position on a firm surface. Keep your hands under your shoulders and your knees under your hips. You may place padding under your knees for comfort. Drop your head and point your tailbone toward the ground below you. This will round out your lower back like an angry cat. Hold this position for 30 seconds. Slowly lift your head and release your tail bone so that your back sags into a large arch, like an old horse. Hold this position for 30 seconds. Repeat this until you feel limber in your low back. Now, find your "sweet spot." This will be the most comfortable position somewhere between the two previous positions. This is your neutral spine. Once you have found this position, tense your stomach muscles to support your low back. Hold this position for 30 seconds. Repeat 2 times. Complete this exercise 3 times per week.   STRENGTHENING EXERCISES - Low  Back Sprain These exercises may help you when beginning to rehabilitate your injury. These exercises should be done near your "sweet spot." This is the neutral, low-back arch, somewhere between fully rounded and fully arched, that is your least painful position. When performed in this safe range of motion, these exercises can be used for people who have either a flexion or extension based injury. These exercises may resolve your symptoms with or without further involvement from your physician, physical therapist or athletic trainer. While completing these exercises, remember:  Muscles can gain both the endurance and the strength needed for everyday activities through controlled exercises. Complete these exercises as instructed by your physician, physical therapist or athletic trainer. Increase the resistance and repetitions only as guided. You may experience muscle soreness or fatigue, but the pain or discomfort you are trying to eliminate should never worsen during these exercises. If this pain does worsen, stop and make certain  you are following the directions exactly. If the pain is still present after adjustments, discontinue the exercise until you can discuss the trouble with your caregiver.  STRENGTHENING - Deep Abdominals, Pelvic Tilt  Lie on a firm bed or floor. Keeping your legs in front of you, bend your knees so they are both pointed toward the ceiling and your feet are flat on the floor. Tense your lower abdominal muscles to press your low back into the floor. This motion will rotate your pelvis so that your tail bone is scooping upwards rather than pointing at your feet or into the floor. With a gentle tension and even breathing, hold this position for 3 seconds. Repeat 2 times. Complete this exercise 3 times per week.   STRENGTHENING - Abdominals, Crunches  Lie on a firm bed or floor. Keeping your legs in front of you, bend your knees so they are both pointed toward the ceiling and your  feet are flat on the floor. Cross your arms over your chest. Slightly tip your chin down without bending your neck. Tense your abdominals and slowly lift your trunk high enough to just clear your shoulder blades. Lifting higher can put excessive stress on the lower back and does not further strengthen your abdominal muscles. Control your return to the starting position. Repeat 2 times. Complete this exercise 3 times per week.   STRENGTHENING - Quadruped, Opposite UE/LE Lift  Assume a hands and knees position on a firm surface. Keep your hands under your shoulders and your knees under your hips. You may place padding under your knees for comfort. Find your neutral spine and gently tense your abdominal muscles so that you can maintain this position. Your shoulders and hips should form a rectangle that is parallel with the floor and is not twisted. Keeping your trunk steady, lift your right hand no higher than your shoulder and then your left leg no higher than your hip. Make sure you are not holding your breath. Hold this position for 30 seconds. Continuing to keep your abdominal muscles tense and your back steady, slowly return to your starting position. Repeat with the opposite arm and leg. Repeat 2 times. Complete this exercise 3 times per week.   STRENGTHENING - Abdominals and Quadriceps, Straight Leg Raise  Lie on a firm bed or floor with both legs extended in front of you. Keeping one leg in contact with the floor, bend the other knee so that your foot can rest flat on the floor. Find your neutral spine, and tense your abdominal muscles to maintain your spinal position throughout the exercise. Slowly lift your straight leg off the floor about 6 inches for a count of 3, making sure to not hold your breath. Still keeping your neutral spine, slowly lower your leg all the way to the floor. Repeat this exercise with each leg 2 times. Complete this exercise 3 times per week.  POSTURE AND BODY  MECHANICS CONSIDERATIONS - Low Back Sprain Keeping correct posture when sitting, standing or completing your activities will reduce the stress put on different body tissues, allowing injured tissues a chance to heal and limiting painful experiences. The following are general guidelines for improved posture.  While reading these guidelines, remember: The exercises prescribed by your provider will help you have the flexibility and strength to maintain correct postures. The correct posture provides the best environment for your joints to work. All of your joints have less wear and tear when properly supported by a spine with good  posture. This means you will experience a healthier, less painful body. Correct posture must be practiced with all of your activities, especially prolonged sitting and standing. Correct posture is as important when doing repetitive low-stress activities (typing) as it is when doing a single heavy-load activity (lifting).  RESTING POSITIONS Consider which positions are most painful for you when choosing a resting position. If you have pain with flexion-based activities (sitting, bending, stooping, squatting), choose a position that allows you to rest in a less flexed posture. You would want to avoid curling into a fetal position on your side. If your pain worsens with extension-based activities (prolonged standing, working overhead), avoid resting in an extended position such as sleeping on your stomach. Most people will find more comfort when they rest with their spine in a more neutral position, neither too rounded nor too arched. Lying on a non-sagging bed on your side with a pillow between your knees, or on your back with a pillow under your knees will often provide some relief. Keep in mind, being in any one position for a prolonged period of time, no matter how correct your posture, can still lead to stiffness.  PROPER SITTING POSTURE In order to minimize stress and discomfort  on your spine, you must sit with correct posture. Sitting with good posture should be effortless for a healthy body. Returning to good posture is a gradual process. Many people can work toward this most comfortably by using various supports until they have the flexibility and strength to maintain this posture on their own. When sitting with proper posture, your ears will fall over your shoulders and your shoulders will fall over your hips. You should use the back of the chair to support your upper back. Your lower back will be in a neutral position, just slightly arched. You may place a small pillow or folded towel at the base of your lower back for  support.  When working at a desk, create an environment that supports good, upright posture. Without extra support, muscles tire, which leads to excessive strain on joints and other tissues. Keep these recommendations in mind:  CHAIR: A chair should be able to slide under your desk when your back makes contact with the back of the chair. This allows you to work closely. The chair's height should allow your eyes to be level with the upper part of your monitor and your hands to be slightly lower than your elbows.  BODY POSITION Your feet should make contact with the floor. If this is not possible, use a foot rest. Keep your ears over your shoulders. This will reduce stress on your neck and low back.  INCORRECT SITTING POSTURES  If you are feeling tired and unable to assume a healthy sitting posture, do not slouch or slump. This puts excessive strain on your back tissues, causing more damage and pain. Healthier options include: Using more support, like a lumbar pillow. Switching tasks to something that requires you to be upright or walking. Talking a brief walk. Lying down to rest in a neutral-spine position.  PROLONGED STANDING WHILE SLIGHTLY LEANING FORWARD  When completing a task that requires you to lean forward while standing in one place for a  long time, place either foot up on a stationary 2-4 inch high object to help maintain the best posture. When both feet are on the ground, the lower back tends to lose its slight inward curve. If this curve flattens (or becomes too large), then the back and  your other joints will experience too much stress, tire more quickly, and can cause pain.  CORRECT STANDING POSTURES Proper standing posture should be assumed with all daily activities, even if they only take a few moments, like when brushing your teeth. As in sitting, your ears should fall over your shoulders and your shoulders should fall over your hips. You should keep a slight tension in your abdominal muscles to brace your spine. Your tailbone should point down to the ground, not behind your body, resulting in an over-extended swayback posture.   INCORRECT STANDING POSTURES  Common incorrect standing postures include a forward head, locked knees and/or an excessive swayback. WALKING Walk with an upright posture. Your ears, shoulders and hips should all line-up.  PROLONGED ACTIVITY IN A FLEXED POSITION When completing a task that requires you to bend forward at your waist or lean over a low surface, try to find a way to stabilize 3 out of 4 of your limbs. You can place a hand or elbow on your thigh or rest a knee on the surface you are reaching across. This will provide you more stability, so that your muscles do not tire as quickly. By keeping your knees relaxed, or slightly bent, you will also reduce stress across your lower back. CORRECT LIFTING TECHNIQUES  DO : Assume a wide stance. This will provide you more stability and the opportunity to get as close as possible to the object which you are lifting. Tense your abdominals to brace your spine. Bend at the knees and hips. Keeping your back locked in a neutral-spine position, lift using your leg muscles. Lift with your legs, keeping your back straight. Test the weight of unknown objects  before attempting to lift them. Try to keep your elbows locked down at your sides in order get the best strength from your shoulders when carrying an object.   Always ask for help when lifting heavy or awkward objects. INCORRECT LIFTING TECHNIQUES DO NOT:  Lock your knees when lifting, even if it is a small object. Bend and twist. Pivot at your feet or move your feet when needing to change directions. Assume that you can safely pick up even a paperclip without proper posture.

## 2021-08-01 LAB — CBC
HCT: 36.8 % (ref 36.0–46.0)
Hemoglobin: 11.9 g/dL — ABNORMAL LOW (ref 12.0–15.0)
MCHC: 32.2 g/dL (ref 30.0–36.0)
MCV: 82.8 fl (ref 78.0–100.0)
Platelets: 361 10*3/uL (ref 150.0–400.0)
RBC: 4.44 Mil/uL (ref 3.87–5.11)
RDW: 14.4 % (ref 11.5–15.5)
WBC: 6.2 10*3/uL (ref 4.0–10.5)

## 2021-08-01 LAB — COMPREHENSIVE METABOLIC PANEL
ALT: 11 U/L (ref 0–35)
AST: 12 U/L (ref 0–37)
Albumin: 4.3 g/dL (ref 3.5–5.2)
Alkaline Phosphatase: 56 U/L (ref 39–117)
BUN: 9 mg/dL (ref 6–23)
CO2: 31 mEq/L (ref 19–32)
Calcium: 9.2 mg/dL (ref 8.4–10.5)
Chloride: 103 mEq/L (ref 96–112)
Creatinine, Ser: 0.61 mg/dL (ref 0.40–1.20)
GFR: 107.63 mL/min (ref >60.00)
Glucose, Bld: 71 mg/dL (ref 70–99)
Potassium: 3.9 mEq/L (ref 3.5–5.1)
Sodium: 139 mEq/L (ref 135–145)
Total Bilirubin: 0.3 mg/dL (ref 0.2–1.2)
Total Protein: 7.1 g/dL (ref 6.0–8.3)

## 2021-08-01 LAB — LIPID PANEL
Cholesterol: 171 mg/dL (ref 0–200)
HDL: 52.2 mg/dL (ref >39.00)
LDL Cholesterol: 105 mg/dL — ABNORMAL HIGH (ref 0–99)
NonHDL: 118.43
Total CHOL/HDL Ratio: 3
Triglycerides: 66 mg/dL (ref 0.0–149.0)
VLDL: 13.2 mg/dL (ref 0.0–40.0)

## 2021-08-01 LAB — HEPATITIS C ANTIBODY
Hepatitis C Ab: NONREACTIVE
SIGNAL TO CUT-OFF: 0.04 (ref <1.00)

## 2021-08-07 ENCOUNTER — Encounter: Payer: Self-pay | Admitting: Family Medicine

## 2021-08-23 ENCOUNTER — Telehealth: Payer: Self-pay | Admitting: Family Medicine

## 2021-08-23 NOTE — Telephone Encounter (Signed)
please contact pt to inform her if paperwork for disability was received via fax. please advise.

## 2021-08-24 NOTE — Telephone Encounter (Signed)
Have not received paperwork yet.

## 2021-08-28 ENCOUNTER — Encounter: Payer: Self-pay | Admitting: Family Medicine

## 2021-08-28 NOTE — Telephone Encounter (Signed)
Have not seen her paperwork yet. She did message on my chart and has been informed

## 2021-09-12 ENCOUNTER — Encounter: Payer: Self-pay | Admitting: Family Medicine

## 2021-09-12 ENCOUNTER — Telehealth (INDEPENDENT_AMBULATORY_CARE_PROVIDER_SITE_OTHER): Payer: Medicaid Other | Admitting: Family Medicine

## 2021-09-12 DIAGNOSIS — F411 Generalized anxiety disorder: Secondary | ICD-10-CM | POA: Diagnosis not present

## 2021-09-12 MED ORDER — ALBUTEROL SULFATE HFA 108 (90 BASE) MCG/ACT IN AERS: 1.0000 | INHALATION_SPRAY | Freq: Four times a day (QID) | RESPIRATORY_TRACT | 5 refills | Status: DC | PRN

## 2021-09-12 NOTE — Progress Notes (Signed)
Chief Complaint  Patient presents with   Shoulder Pain    Discuss going back to work    Subjective: Patient is a 46 y.o. female here for f/u. Due to COVID-19 pandemic, we are interacting via web portal for an electronic face-to-face visit. I verified patient's ID using 2 identifiers. Patient agreed to proceed with visit via this method. Patient is at home, I am at office. Patient and I are present for visit.   Patient continues to have generalized anxiety.  She is currently taking Celexa 20 mg daily.  She is also following with a therapist to think she should remain out of work for 2 extra months.  Her job is her main stressor.  She is in the process of finding a new place of work.  She will have dreams about her job if she interacts with anybody from work.  No homicidal or suicidal ideation.  She is not self-medicating.  Past Medical History:  Diagnosis Date   Allergy    hayfever   Anal skin tag    Eczema    Headache    History of hay fever    History of migraine    Hypertension    Moderate persistent asthma 12/20/2016   Seizures ([place])    11-25-19- one time occurence, asthma induced, none since   Urticaria     Objective: No conversational dyspnea Age appropriate judgment and insight Nml affect and mood  Assessment and Plan: GAD (generalized anxiety disorder)  Okay to continue leave for another months through the last week and in March.  She will drop off forms when convenient.  Continue Celexa 20 mg daily.  Continue with the counseling team.  Follow-up as originally scheduled. The patient voiced understanding and agreement to the plan.  [place], DO 09/12/21  2:26 PM

## 2021-09-18 ENCOUNTER — Telehealth: Payer: Self-pay | Admitting: Family Medicine

## 2021-09-18 NOTE — Telephone Encounter (Signed)
Forms done/faxed Called the patient left msg. To call back if need a copy

## 2021-09-18 NOTE — Telephone Encounter (Signed)
Patient came and dropped off forms to be filled out by wendling Placed in wendling bin up front Patient would like to be called whenever its ready

## 2021-09-18 NOTE — Telephone Encounter (Signed)
Patient informed and will pickup her a copy at the front desk.

## 2021-09-24 ENCOUNTER — Encounter: Payer: Self-pay | Admitting: Family Medicine

## 2021-09-26 ENCOUNTER — Ambulatory Visit: Payer: Medicaid Other | Admitting: Family Medicine

## 2021-09-26 ENCOUNTER — Encounter: Payer: Self-pay | Admitting: Family Medicine

## 2021-09-26 VITALS — BP 118/72 | HR 77 | Temp 98.5°F | Ht 65.0 in | Wt 164.2 lb

## 2021-09-26 DIAGNOSIS — K2900 Acute gastritis without bleeding: Secondary | ICD-10-CM

## 2021-09-26 NOTE — Patient Instructions (Addendum)
This was probably food poisoning.   Let me know if symptoms return.   Take Metamucil or Benefiber daily.  Call to reschedule your colonoscopy.   Let us know if you need anything.

## 2021-09-26 NOTE — Progress Notes (Signed)
Chief Complaint  Patient presents with   Follow-up   Abdominal Pain     Subjective Kimberly Ali is a 46 y.o. female who presents with vomiting and diarrhea Symptoms began 1 week ago.  Patient has had resolved N/V/D but current constipation, early satiety, cramping Patient denies vomiting, fever, and arthralgias Treatment to date: Pepto Bismol Started 6 hrs after going out to lunch w friends, waitress told her table after the fact she had been dealing w food poisoning.  Sick contacts: none known  Past Medical History:  Diagnosis Date   Allergy    hayfever   Anal skin tag    Eczema    Headache    History of hay fever    History of migraine    Hypertension    Moderate persistent asthma 12/20/2016   Seizures ([place])    11-25-19- one time occurence, asthma induced, none since   Urticaria     Exam BP 118/72    Pulse 77    Temp 98.5 F (36.9 C) (Oral)    Ht 5\' 5"  (1.651 m)    Wt 164 lb 4 oz (74.5 kg)    SpO2 99%    BMI 27.33 kg/m  General:  well developed, well hydrated, in no apparent distress Skin:  warm, no pallor or diaphoresis, no rashes Throat/Pharynx:  lips and gingiva without lesion; tongue and uvula midline; non-inflamed pharynx; no exudates or postnasal drainage Lungs:  clear to auscultation, breath sounds equal bilaterally, no respiratory distress, no wheezes Cardio:  RRR Abdomen:  abdomen soft, mild ttp in umbilical region; bowel sounds normal; no masses or organomegaly Psych: Appropriate judgement/insight  Assessment and Plan  Acute gastritis without hemorrhage, unspecified gastritis type  Improving. Sounds like food poisoning. Steadily improving. Offered Bentyl and Zofran, she politely declined. Consider addition of fiber supp like Metamucil to help facilitate BM's.  Avoid aggravating foods, discussed advancing diet. F/u if symptoms fail to improve, sooner if worsening. The patient voiced understanding and agreement to the plan.  [place], DO 09/26/21  2:16 PM

## 2021-10-31 ENCOUNTER — Encounter: Payer: Self-pay | Admitting: Obstetrics & Gynecology

## 2021-10-31 ENCOUNTER — Ambulatory Visit (INDEPENDENT_AMBULATORY_CARE_PROVIDER_SITE_OTHER): Payer: 59 | Admitting: Obstetrics & Gynecology

## 2021-10-31 ENCOUNTER — Other Ambulatory Visit: Payer: Self-pay

## 2021-10-31 VITALS — BP 143/94 | HR 76 | Wt 166.0 lb

## 2021-10-31 DIAGNOSIS — Z01419 Encounter for gynecological examination (general) (routine) without abnormal findings: Secondary | ICD-10-CM | POA: Diagnosis not present

## 2021-10-31 DIAGNOSIS — Z1211 Encounter for screening for malignant neoplasm of colon: Secondary | ICD-10-CM | POA: Diagnosis not present

## 2021-10-31 NOTE — Progress Notes (Signed)
Subjective:  ?  ? Catrinia Racicot is a 46 y.o. female here for a routine exam.  Current complaints: none. Pts partner is interested in info about using pts eggs for fertilization. Pt has no GYN concerns. Was scheduled for colonoscopy last year but, declined due to work conflict.  Pt has no sx of perimenopause.   ?  ?Gynecologic History ?No LMP recorded. Patient has had a hysterectomy. ?Contraception: status post hysterectomy ?Last Pap: n/a.  ?Last mammogram: 07/17/2021. 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 ?Obstetric History ?OB History  ?Gravida Para Term Preterm AB Living  ?'6 4 1 3 2 4  '$ ?SAB IAB Ectopic Multiple Live Births  ?2 0 0 1 4  ?  ?# Outcome Date GA Lbr Len/2nd Weight Sex Delivery Anes PTL Lv  ?6 Preterm 2010 [redacted]w[redacted]d  F Vag-Spont None Y LIV  ?5 SAB 2009          ?4 Preterm 2006 [id number] M Vag-Spont EPI Y LIV  ?3 Term 2002 [id number]  None N LIV  ?2 SAB 2000          ?1 Preterm [id number]  None Y LIV  ? ? ? ?The following portions of the patient's history were reviewed and updated as appropriate: allergies, current medications, past family history, past medical history, past social history, past surgical history, and problem list. ? ?Review of Systems ?Pertinent items are noted in HPI.  ?  ?Objective:  ?BP (!) 143/94   Pulse 76   Wt 166 lb (75.3 kg)   BMI 27.62 kg/m?  ? ?General Appearance:    Alert, cooperative, no distress, appears stated age  ?Head:    Normocephalic, without obvious abnormality, atraumatic  ?Eyes:    conjunctiva/corneas clear, EOM's intact, both eyes  ?Ears:    Normal external ear canals, both ears  ?Nose:   Nares normal, septum midline, mucosa normal, no drainage    or sinus tenderness  ?Throat:   Lips, mucosa, and tongue normal; teeth and gums normal  ?Neck:   Supple, symmetrical, trachea midline, no adenopathy;  ?  thyroid:  no enlargement/tenderness/nodules  ?Back:     Symmetric, no curvature, ROM  normal, no CVA tenderness  ?Lungs:     respirations unlabored  ?Chest Wall:    No tenderness or deformity  ? Heart:    Regular rate and rhythm  ?Breast Exam:    No tenderness, masses, or nipple abnormality  ?Abdomen:     Soft, non-tender, bowel sounds active all four quadrants,  ?  no masses, no organomegaly  ?Genitalia:    Normal female without lesion, discharge or tenderness  ?   ?Extremities:   Extremities normal, atraumatic, no cyanosis or edema  ?Pulses:   2+ and symmetric all extremities  ?Skin:   Skin color, texture, turgor normal, no rashes or lesions  ?  ? ?Assessment:  ? ? Healthy female exam.  ?Pt s/p abnormal PAP with benign bx last year in Nov. Normal exam today. Counseled to keep UTD with imagining.  ?  ?Plan:  ?F/u in 1 year or sooner prn  ?Screening colonoscopy ? ?Teodoro Jeffreys L. Harraway-Smith, M.D., FACOWinnebago ?  ? ?

## 2021-11-01 ENCOUNTER — Other Ambulatory Visit: Payer: Self-pay | Admitting: Family Medicine

## 2021-11-02 ENCOUNTER — Other Ambulatory Visit: Payer: Self-pay | Admitting: Family Medicine

## 2021-11-02 DIAGNOSIS — J4540 Moderate persistent asthma, uncomplicated: Secondary | ICD-10-CM

## 2021-11-06 ENCOUNTER — Encounter: Payer: Self-pay | Admitting: Internal Medicine

## 2021-12-03 ENCOUNTER — Ambulatory Visit (AMBULATORY_SURGERY_CENTER): Payer: 59

## 2021-12-03 VITALS — Ht 65.0 in | Wt 164.0 lb

## 2021-12-03 DIAGNOSIS — Z1211 Encounter for screening for malignant neoplasm of colon: Secondary | ICD-10-CM

## 2021-12-03 MED ORDER — NA SULFATE-K SULFATE-MG SULF 17.5-3.13-1.6 GM/177ML PO SOLN: 1.0000 | Freq: Once | ORAL | 0 refills | Status: AC

## 2021-12-03 NOTE — Progress Notes (Signed)
No egg or soy allergy known to patient -can eat eggs that are baked in ?No issues known to pt with past sedation with any surgeries or procedures ?Patient denies ever being told they had issues or difficulty with intubation  ?No FH of Malignant Hyperthermia ?Pt is not on diet pills ?Pt is not on  home 02  ?Pt is not on blood thinners  ?Pt denies issues with constipation  ?No A fib or A flutter ? ? ?PV completed over the phone. Pt verified name, DOB, address and insurance during PV today.  ?Pt mailed instruction packet with copy of consent form to read and not return, and instructions.  ?Pt encouraged to call with questions or issues.  ?If pt has My chart, procedure instructions sent via My Chart  ? ?

## 2021-12-14 ENCOUNTER — Telehealth: Payer: Self-pay | Admitting: Family Medicine

## 2021-12-14 NOTE — Telephone Encounter (Signed)
Pt called stating she needed an updated TB shot for her job. Advised her that we would need to check to see if she was due for one and if so, we would call back to schedule it. Please Advise. ?

## 2021-12-14 NOTE — Telephone Encounter (Signed)
Pt. Scheduled

## 2021-12-14 NOTE — Telephone Encounter (Signed)
She will need to schedule an appointment with Dr. Nani Ravens  ?

## 2021-12-17 ENCOUNTER — Encounter: Payer: Self-pay | Admitting: Family Medicine

## 2021-12-17 ENCOUNTER — Ambulatory Visit: Payer: Medicaid Other | Admitting: Family Medicine

## 2021-12-17 VITALS — BP 114/62 | HR 58 | Temp 98.1°F | Ht 65.0 in | Wt 164.5 lb

## 2021-12-17 DIAGNOSIS — Z111 Encounter for screening for respiratory tuberculosis: Secondary | ICD-10-CM

## 2021-12-17 DIAGNOSIS — J4540 Moderate persistent asthma, uncomplicated: Secondary | ICD-10-CM | POA: Diagnosis not present

## 2021-12-17 DIAGNOSIS — F411 Generalized anxiety disorder: Secondary | ICD-10-CM | POA: Diagnosis not present

## 2021-12-17 DIAGNOSIS — I10 Essential (primary) hypertension: Secondary | ICD-10-CM

## 2021-12-17 MED ORDER — FLUTICASONE PROPIONATE 50 MCG/ACT NA SUSP: 2.0000 | Freq: Every day | NASAL | 6 refills | Status: DC

## 2021-12-17 MED ORDER — LEVOCETIRIZINE DIHYDROCHLORIDE 5 MG PO TABS: 5.0000 mg | ORAL_TABLET | Freq: Every evening | ORAL | 2 refills | Status: DC

## 2021-12-17 MED ORDER — AZELASTINE HCL 0.1 % NA SOLN: 2.0000 | Freq: Two times a day (BID) | NASAL | 12 refills | Status: DC

## 2021-12-17 NOTE — Progress Notes (Signed)
Chief Complaint  ?Patient presents with  ? needs TB test for job  ? ? ?Subjective ?Kimberly Ali is a 46 y.o. female who presents for hypertension follow up. ?She does monitor home blood pressures. ?Blood pressures ranging from 120's/70's on average. ?She is compliant with medication- Norvasc 5 mg/d. ?Patient has these side effects of medication: none ?She is adhering to a healthy diet overall. ?Current exercise: lifting wts, walking, cycling ?No Cp or SOB ? ?GAD ?Takig Celexa 20 mg/d, reports compliance, no AE's. Also taking Atarax prn. No SI or HI. No self medication. She is following with a Social worker.  ? ?Asthma ?Compliant with Breo 200-25 mcg daily, Singulair 10 mg daily.  She reports remembering to rinse mouth out after use and no adverse effects.  Breathing well overall.  Having allergy issues.  Also taking Astelin, Flonase intermittently, and Zyrtec. ?  ?Past Medical History:  ?Diagnosis Date  ? Allergy   ? hayfever  ? Anal skin tag   ? Eczema   ? Headache   ? History of hay fever   ? History of migraine   ? Hypertension   ? Moderate persistent asthma 12/20/2016  ? Seizures ([place])   ? 11-25-19- one time occurence, asthma induced, none since  ? Urticaria   ? ? ?Exam ?BP 114/62   Pulse (!) 58   Temp 98.1 ?F (36.7 ?C) (Oral)   Ht '5\' 5"'$  (1.651 m)   Wt 164 lb 8 oz (74.6 kg)   SpO2 97%   BMI 27.37 kg/m?  ?General:  well developed, well nourished, in no apparent distress ?Ears: Patent, no TM abnormalities ?Nose: Turbinates swollen and pale/boggy on the right, pale and boggy in the left, no sinus TTP ?Heart: RRR, no bruits, no LE edema ?Lungs: clear to auscultation, no accessory muscle use ?Psych: well oriented with normal range of affect and appropriate judgment/insight ? ?Essential hypertension ? ?GAD (generalized anxiety disorder) ? ?Moderate persistent asthma without complication ? ?Screening-pulmonary TB - Plan: QuantiFERON-TB Gold Plus ? ?Chronic, stable.  Continue atorvastatin 5 mg daily.  Monitor  blood pressure at home intermittently to make sure it is not going too low.  Counseled on diet and exercise. ?Chronic, stable.  Continue Celexa 20 mg daily, Atarax as needed.  Continue with therapy.  She may wish to come off of this in the future. ?Chronic, stable.  Continue Breo 200-25 mcg daily and singular 10 mg daily.  Change Zyrtec to Xyzal.  Add Flonase daily in addition to Astelin nasal spray.  She needs to follow-up with her allergist as well.  ?Screening TB for new job ?F/u in December for physical. ?The patient voiced understanding and agreement to the plan. ? ?Shelda Pal, DO ?12/17/21  ?10:20 AM ? ?

## 2021-12-17 NOTE — Patient Instructions (Signed)
Give [place] 2-3 business days to get the results of your labs back.  ? ?Keep the diet clean and stay active. ? ?Go back on Flonase daily.  ?

## 2021-12-19 LAB — QUANTIFERON-TB GOLD PLUS
Mitogen-NIL: >10 IU/mL
NIL: 0.03 IU/mL
QuantiFERON-TB Gold Plus: NEGATIVE
TB1-NIL: 0.01 IU/mL
TB2-NIL: 0.01 IU/mL

## 2021-12-24 ENCOUNTER — Ambulatory Visit (AMBULATORY_SURGERY_CENTER): Payer: Medicaid Other | Admitting: Internal Medicine

## 2021-12-24 ENCOUNTER — Encounter: Payer: Self-pay | Admitting: Internal Medicine

## 2021-12-24 VITALS — BP 149/99 | HR 81 | Temp 97.8°F | Resp 16 | Ht 65.0 in | Wt 166.0 lb

## 2021-12-24 DIAGNOSIS — Z1211 Encounter for screening for malignant neoplasm of colon: Secondary | ICD-10-CM

## 2021-12-24 DIAGNOSIS — I10 Essential (primary) hypertension: Secondary | ICD-10-CM | POA: Diagnosis not present

## 2021-12-24 MED ORDER — SODIUM CHLORIDE 0.9 % IV SOLN: 500.0000 mL | Freq: Once | INTRAVENOUS | Status: DC

## 2021-12-24 NOTE — Progress Notes (Signed)
Pt's states no medical or surgical changes since previsit or office visit. VS assessed by D.T 

## 2021-12-24 NOTE — Op Note (Signed)
[place] ?Patient Name: Kimberly Ali ?Procedure Date: 12/24/2021 12:42 PM ?MRN: [id number] ?Endoscopist: Docia Chuck. Henrene Pastor , MD ?Age: 46 ?Referring MD:  ?Date of Birth: August 01, 1976 ?Gender: Female ?Account #: [id number] ?Procedure:                Colonoscopy ?Indications:              Screening for colorectal malignant neoplasm ?Medicines:                Monitored Anesthesia Care ?Procedure:                Pre-Anesthesia Assessment: ?                          - Prior to the procedure, a History and Physical  ?                          was performed, and patient medications and  ?                          allergies were reviewed. The patient's tolerance of  ?                          previous anesthesia was also reviewed. The risks  ?                          and benefits of the procedure and the sedation  ?                          options and risks were discussed with the patient.  ?                          All questions were answered, and informed consent  ?                          was obtained. Prior Anticoagulants: The patient has  ?                          taken no previous anticoagulant or antiplatelet  ?                          agents. ASA Grade Assessment: II - A patient with  ?                          mild systemic disease. After reviewing the risks  ?                          and benefits, the patient was deemed in  ?                          satisfactory condition to undergo the procedure. ?                          After obtaining informed consent, the colonoscope  ?  was passed under direct vision. Throughout the  ?                          procedure, the patient's blood pressure, pulse, and  ?                          oxygen saturations were monitored continuously. The  ?                          Colonoscope was introduced through the anus and  ?                          advanced to the the cecum, identified by  ?                          appendiceal orifice and  ileocecal valve. The  ?                          ileocecal valve, appendiceal orifice, and rectum  ?                          were photographed. The quality of the bowel  ?                          preparation was excellent. The colonoscopy was  ?                          performed without difficulty. The patient tolerated  ?                          the procedure well. The bowel preparation used was  ?                          SUPREP via split dose instruction. ?Scope In: 1:10:03 PM ?Scope Out: 1:22:03 PM ?Scope Withdrawal Time: 0 hours 9 minutes 12 seconds  ?Total Procedure Duration: 0 hours 12 minutes 0 seconds  ?Findings:                 The entire examined colon appeared normal on direct  ?                          and retroflexion views. ?Complications:            No immediate complications. Estimated blood loss:  ?                          None. ?Estimated Blood Loss:     Estimated blood loss: none. ?Impression:               - The entire examined colon is normal on direct and  ?                          retroflexion views. ?                          - No specimens collected. ?  Recommendation:           - Repeat colonoscopy in 10 years for screening  ?                          purposes. ?                          - Patient has a contact number available for  ?                          emergencies. The signs and symptoms of potential  ?                          delayed complications were discussed with the  ?                          patient. Return to normal activities tomorrow.  ?                          Written discharge instructions were provided to the  ?                          patient. ?                          - Resume previous diet. ?                          - Continue present medications. ?Docia Chuck. Henrene Pastor, MD ?12/24/2021 1:25:48 PM ?This report has been signed electronically. ?

## 2021-12-24 NOTE — Progress Notes (Signed)
Vss nad trans to pacu °

## 2021-12-24 NOTE — Progress Notes (Signed)
HISTORY OF PRESENT ILLNESS: ? ?Kimberly Ali is a 46 y.o. female who presents today for screening colonoscopy.  No GI complaints ? ?REVIEW OF SYSTEMS: ? ?All non-GI ROS negative. ?Past Medical History:  ?Diagnosis Date  ? Allergy   ? hayfever  ? Anal skin tag   ? Eczema   ? Headache   ? History of hay fever   ? History of migraine   ? Hypertension   ? Moderate persistent asthma 12/20/2016  ? Seizures ([place])   ? 11-25-19- one time occurence, asthma induced, none since  ? Urticaria   ? ? ?Past Surgical History:  ?Procedure Laterality Date  ? ABDOMINAL HYSTERECTOMY    ? Fibroids  ? CERVIX SURGERY    ? x2  ? DILATION AND CURETTAGE OF UTERUS    ? EXCISION OF SKIN TAG N/A 07/23/2017  ? Procedure: EXCISION OF SKIN TAG;  Surgeon: Clovis Riley, MD;  Location: [facility];  Service: General;  Laterality: N/A;  ? HEMORRHOID SURGERY  2018  ? states laser surgery for one hemorrhoid  ? ? ?Social History ?Brynda Rim Labat  reports that she quit smoking about 19 years ago. Her smoking use included cigarettes. She smoked an average of .25 packs per day. She has never used smokeless tobacco. She reports current alcohol use of about 1.0 standard drink per week. She reports that she does not use drugs. ? ?family history includes Alcohol abuse in her maternal grandfather and maternal grandmother; Allergic rhinitis in her father; Cancer in her father; Diabetes in her father; Hypertension in her father. ? ?Allergies  ?Allergen Reactions  ? Eggs Or Egg-Derived Products   ?  Causes nausea and vomiting  ? Haemophilus Influenzae Vaccines Anaphylaxis  ? Peanut-Containing Drug Products Anaphylaxis  ? Almond (Diagnostic) Itching and Swelling  ? Dust Mite Extract   ? ? ?  ? ?PHYSICAL EXAMINATION: ? ?Vital signs: BP (!) 163/99   Pulse 76   Temp 97.8 ?F (36.6 ?C) (Skin)   Resp 16   Ht '5\' 5"'$  (1.651 m)   Wt 166 lb (75.3 kg)   SpO2 100%   BMI 27.62 kg/m?  ?General: Well-developed, well-nourished, no acute  distress ?HEENT: Sclerae are anicteric, conjunctiva pink. Oral mucosa intact ?Lungs: Clear ?Heart: Regular ?Abdomen: soft, nontender, nondistended, no obvious ascites, no peritoneal signs, normal bowel sounds. No organomegaly. ?Extremities: No edema ?Psychiatric: alert and oriented x3. Cooperative  ? ? ? ?ASSESSMENT: ? ?Colon cancer screening.  Average risk ? ? ?PLAN: ? ?Screening colonoscopy ? ? ? ? ?  ?

## 2021-12-24 NOTE — Patient Instructions (Signed)
YOU HAD AN ENDOSCOPIC PROCEDURE TODAY AT THE [facility]:   Refer to the procedure report that was given to you for any specific questions about what was found during the examination.  If the procedure report does not answer your questions, please call your gastroenterologist to clarify.  If you requested that your care partner not be given the details of your procedure findings, then the procedure report has been included in a sealed envelope for you to review at your convenience later.  YOU SHOULD EXPECT: Some feelings of bloating in the abdomen. Passage of more gas than usual.  Walking can help get rid of the air that was put into your GI tract during the procedure and reduce the bloating. If you had a lower endoscopy (such as a colonoscopy or flexible sigmoidoscopy) you may notice spotting of blood in your stool or on the toilet paper. If you underwent a bowel prep for your procedure, you may not have a normal bowel movement for a few days.  Please Note:  You might notice some irritation and congestion in your nose or some drainage.  This is from the oxygen used during your procedure.  There is no need for concern and it should clear up in a day or so.  SYMPTOMS TO REPORT IMMEDIATELY:   Following lower endoscopy (colonoscopy or flexible sigmoidoscopy):  Excessive amounts of blood in the stool  Significant tenderness or worsening of abdominal pains  Swelling of the abdomen that is new, acute  Fever of 100F or higher  For urgent or emergent issues, a gastroenterologist can be reached at any hour by calling [phone]. Do not use MyChart messaging for urgent concerns.    DIET:  We do recommend a small meal at first, but then you may proceed to your regular diet.  Drink plenty of fluids but you should avoid alcoholic beverages for 24 hours.  ACTIVITY:  You should plan to take it easy for the rest of today and you should NOT DRIVE or use heavy machinery until tomorrow (because  of the sedation medicines used during the test).    FOLLOW UP: Our staff will call the number listed on your records 48-72 hours following your procedure to check on you and address any questions or concerns that you may have regarding the information given to you following your procedure. If we do not reach you, we will leave a message.  We will attempt to reach you two times.  During this call, we will ask if you have developed any symptoms of COVID 19. If you develop any symptoms (ie: fever, flu-like symptoms, shortness of breath, cough etc.) before then, please call [phone].  If you test positive for Covid 19 in the 2 weeks post procedure, please call and report this information to us.    If any biopsies were taken you will be contacted by phone or by letter within the next 1-3 weeks.  Please call us at [phone] if you have not heard about the biopsies in 3 weeks.    SIGNATURES/CONFIDENTIALITY: You and/or your care partner have signed paperwork which will be entered into your electronic medical record.  These signatures attest to the fact that that the information above on your After Visit Summary has been reviewed and is understood.  Full responsibility of the confidentiality of this discharge information lies with you and/or your care-partner. 

## 2021-12-26 ENCOUNTER — Telehealth: Payer: Self-pay

## 2021-12-26 NOTE — Telephone Encounter (Signed)
Left message on answering machine. 

## 2022-01-17 ENCOUNTER — Encounter (HOSPITAL_BASED_OUTPATIENT_CLINIC_OR_DEPARTMENT_OTHER): Payer: Self-pay | Admitting: Emergency Medicine

## 2022-01-17 ENCOUNTER — Emergency Department (HOSPITAL_BASED_OUTPATIENT_CLINIC_OR_DEPARTMENT_OTHER)
Admission: EM | Admit: 2022-01-17 | Discharge: 2022-01-17 | Disposition: A | Discharge status: Home / Self Care | Payer: Medicaid Other | Attending: Emergency Medicine | Admitting: Emergency Medicine

## 2022-01-17 ENCOUNTER — Emergency Department (HOSPITAL_BASED_OUTPATIENT_CLINIC_OR_DEPARTMENT_OTHER): Payer: Medicaid Other

## 2022-01-17 ENCOUNTER — Other Ambulatory Visit: Payer: Self-pay

## 2022-01-17 DIAGNOSIS — Z79899 Other long term (current) drug therapy: Secondary | ICD-10-CM | POA: Diagnosis not present

## 2022-01-17 DIAGNOSIS — Z91010 Allergy to peanuts: Secondary | ICD-10-CM | POA: Diagnosis not present

## 2022-01-17 DIAGNOSIS — R0789 Other chest pain: Secondary | ICD-10-CM | POA: Diagnosis not present

## 2022-01-17 DIAGNOSIS — R072 Precordial pain: Secondary | ICD-10-CM | POA: Insufficient documentation

## 2022-01-17 LAB — BASIC METABOLIC PANEL
Anion gap: 6 (ref 5–15)
BUN: 11 mg/dL (ref 6–20)
CO2: 27 mmol/L (ref 22–32)
Calcium: 9.4 mg/dL (ref 8.9–10.3)
Chloride: 106 mmol/L (ref 98–111)
Creatinine, Ser: 0.82 mg/dL (ref 0.44–1.00)
GFR, Estimated: >60 mL/min (ref >60)
Glucose, Bld: 95 mg/dL (ref 70–99)
Potassium: 3.4 mmol/L — ABNORMAL LOW (ref 3.5–5.1)
Sodium: 139 mmol/L (ref 135–145)

## 2022-01-17 LAB — CBC WITH DIFFERENTIAL/PLATELET
Abs Immature Granulocytes: 0.02 10*3/uL (ref 0.00–0.07)
Basophils Absolute: 0.1 10*3/uL (ref 0.0–0.1)
Basophils Relative: 1 %
Eosinophils Absolute: 0.5 10*3/uL (ref 0.0–0.5)
Eosinophils Relative: 6 %
HCT: 36.5 % (ref 36.0–46.0)
Hemoglobin: 11.8 g/dL — ABNORMAL LOW (ref 12.0–15.0)
Immature Granulocytes: 0 %
Lymphocytes Relative: 30 %
Lymphs Abs: 2.5 10*3/uL (ref 0.7–4.0)
MCH: 27.2 pg (ref 26.0–34.0)
MCHC: 32.3 g/dL (ref 30.0–36.0)
MCV: 84.1 fL (ref 80.0–100.0)
Monocytes Absolute: 0.8 10*3/uL (ref 0.1–1.0)
Monocytes Relative: 9 %
Neutro Abs: 4.6 10*3/uL (ref 1.7–7.7)
Neutrophils Relative %: 54 %
Platelets: 339 10*3/uL (ref 150–400)
RBC: 4.34 MIL/uL (ref 3.87–5.11)
RDW: 13.8 % (ref 11.5–15.5)
WBC: 8.4 10*3/uL (ref 4.0–10.5)
nRBC: 0 % (ref 0.0–0.2)

## 2022-01-17 LAB — TROPONIN I (HIGH SENSITIVITY): Troponin I (High Sensitivity): 3 ng/L (ref <18)

## 2022-01-17 MED ORDER — ALUM & MAG HYDROXIDE-SIMETH 200-200-20 MG/5ML PO SUSP
30.0000 mL | Freq: Once | ORAL | Status: AC
  Administered 2022-01-17: 30 mL via ORAL
  Filled 2022-01-17: qty 30

## 2022-01-17 MED ORDER — OMEPRAZOLE 20 MG PO CPDR: 20.0000 mg | DELAYED_RELEASE_CAPSULE | Freq: Every day | ORAL | 0 refills | Status: DC

## 2022-01-17 NOTE — ED Provider Notes (Signed)
[facility] Provider Note   CSN: [id number] Arrival date & time: 01/17/22  0006     History  Chief Complaint  Patient presents with   Chest Pain    Kimberly Ali is a 46 y.o. female.  The history is provided by the patient.  Chest Pain Pain location:  Substernal area Pain quality: tightness   Pain radiates to:  Does not radiate Pain severity:  Moderate Onset quality:  Gradual Duration:  1 day Timing:  Constant Progression:  Unchanged Chronicity:  New Context: at rest and stress   Relieved by:  Nothing Worsened by:  Nothing Ineffective treatments:  None tried Associated symptoms: no abdominal pain, no altered mental status, no fatigue, no fever, no lower extremity edema, no orthopnea, no palpitations and no shortness of breath   Risk factors: no aortic disease, no birth control and no coronary artery disease   Patient with anxiety presents with chest tightness x 24 hours.  No exertional symptoms.  No DOE, no SOB.  No n/v/d.  No leg pain, no OCP, no travel.  Patient was stressed with a lot going on including daughter's birthday.     Home Medications Prior to Admission medications   Medication Sig Start Date End Date Taking? Authorizing Provider  albuterol (VENTOLIN HFA) 108 (90 Base) MCG/ACT inhaler Inhale 1-2 puffs into the lungs every 6 (six) hours as needed for wheezing or shortness of breath. 09/12/21   Shelda Pal, DO  amLODipine (NORVASC) 5 MG tablet Take 1 tablet (5 mg total) by mouth daily. 07/31/21   Shelda Pal, DO  azelastine (ASTELIN) 0.1 % nasal spray Place 2 sprays into both nostrils 2 (two) times daily. Use in each nostril as directed Patient not taking: Reported on 12/24/2021 12/17/21   Shelda Pal, DO  BREO ELLIPTA 200-25 MCG/ACT AEPB INHALE 1 PUFF BY MOUTH EVERY DAY 11/02/21   Shelda Pal, DO  citalopram (CELEXA) 20 MG tablet TAKE 1 TABLET (20 MG TOTAL) BY MOUTH DAILY. TAKE  1/2 TAB DAILY FOR THE FIRST 2 WEEKS. 07/30/21   Shelda Pal, DO  desonide (DESOWEN) 0.05 % ointment Apply 1 application topically 2 (two) times daily. Use twice a day if needed on eczema flares. Stop when it clears up. Patient not taking: Reported on 12/24/2021 09/27/20   Shelda Pal, DO  EPINEPHrine 0.3 mg/0.3 mL IJ SOAJ injection Inject 0.3 mg into the muscle as needed for anaphylaxis. Patient not taking: Reported on 12/24/2021 09/27/20   Shelda Pal, DO  fluticasone [facility]) 50 MCG/ACT nasal spray Place 2 sprays into both nostrils daily. 12/17/21   Shelda Pal, DO  hydrocortisone 2.5 % cream Apply topically 2 (two) times daily. Patient not taking: Reported on 12/24/2021 07/10/20   Shelda Pal, DO  hydrOXYzine (ATARAX) 10 MG tablet See admin instructions. Patient not taking: Reported on 12/24/2021    [provider]  levocetirizine (XYZAL) 5 MG tablet Take 1 tablet (5 mg total) by mouth every evening. Patient not taking: Reported on 12/24/2021 12/17/21   Shelda Pal, DO  montelukast (SINGULAIR) 10 MG tablet TAKE 1 TABLET BY MOUTH EVERYDAY AT BEDTIME 08/22/20   Shelda Pal, DO  Olopatadine HCl 0.2 % SOLN Apply 1 drop to eye daily as needed (itchy/watery eyes). Patient not taking: Reported on 12/24/2021 02/17/20   Garnet Sierras, DO  Spacer/Aero-Hold Chamber Mask MISC Use with inhaler Patient not taking: Reported on 12/24/2021 12/28/19   Riki Sheer  Paul, DO      Allergies    Eggs or egg-derived products, Haemophilus influenzae vaccines, Peanut-containing drug products, Almond (diagnostic), and Dust mite extract    Review of Systems   Review of Systems  Constitutional:  Negative for fatigue and fever.  HENT:  Negative for facial swelling.   Eyes:  Negative for redness.  Respiratory:  Negative for shortness of breath.   Cardiovascular:  Positive for chest pain. Negative for palpitations, orthopnea and leg swelling.   Gastrointestinal:  Negative for abdominal pain.  Genitourinary:  Negative for difficulty urinating.  Neurological:  Negative for facial asymmetry.  All other systems reviewed and are negative.  Physical Exam Updated Vital Signs BP (!) 178/115 (BP Location: Right Arm)   Pulse 73   Temp 98.1 F (36.7 C) (Oral)   Resp 20   Ht '5\' 5"'$  (1.651 m)   Wt 72.6 kg   SpO2 100%   BMI 26.63 kg/m  Physical Exam Vitals and nursing note reviewed.  Constitutional:      General: She is not in acute distress.    Appearance: Normal appearance.  HENT:     Head: Normocephalic and atraumatic.     Nose: Nose normal.  Eyes:     Extraocular Movements: Extraocular movements intact.     Conjunctiva/sclera: Conjunctivae normal.     Pupils: Pupils are equal, round, and reactive to light.  Cardiovascular:     Rate and Rhythm: Normal rate and regular rhythm.     Pulses: Normal pulses.     Heart sounds: Normal heart sounds.  Pulmonary:     Effort: Pulmonary effort is normal.     Breath sounds: Normal breath sounds.  Abdominal:     Palpations: Abdomen is soft.     Tenderness: There is no abdominal tenderness. There is no guarding or rebound.     Comments: Very gassy   Musculoskeletal:        General: Normal range of motion.     Cervical back: Normal range of motion and neck supple.  Skin:    General: Skin is warm and dry.     Capillary Refill: Capillary refill takes less than 2 seconds.  Neurological:     General: No focal deficit present.     Mental Status: She is alert and oriented to person, place, and time.     Deep Tendon Reflexes: Reflexes normal.  Psychiatric:        Mood and Affect: Mood normal.        Behavior: Behavior normal.    ED Results / Procedures / Treatments   Labs (all labs ordered are listed, but only abnormal results are displayed) Results for orders placed or performed during the hospital encounter of 01/17/22  CBC with Differential  Result Value Ref Range   WBC 8.4  4.0 - 10.5 K/uL   RBC 4.34 3.87 - 5.11 MIL/uL   Hemoglobin 11.8 (L) 12.0 - 15.0 g/dL   HCT 36.5 36.0 - 46.0 %   MCV 84.1 80.0 - 100.0 fL   MCH 27.2 26.0 - 34.0 pg   MCHC 32.3 30.0 - 36.0 g/dL   RDW 13.8 11.5 - 15.5 %   Platelets 339 150 - 400 K/uL   nRBC 0.0 0.0 - 0.2 %   Neutrophils Relative % 54 %   Neutro Abs 4.6 1.7 - 7.7 K/uL   Lymphocytes Relative 30 %   Lymphs Abs 2.5 0.7 - 4.0 K/uL   Monocytes Relative 9 %   Monocytes  Absolute 0.8 0.1 - 1.0 K/uL   Eosinophils Relative 6 %   Eosinophils Absolute 0.5 0.0 - 0.5 K/uL   Basophils Relative 1 %   Basophils Absolute 0.1 0.0 - 0.1 K/uL   Immature Granulocytes 0 %   Abs Immature Granulocytes 0.02 0.00 - 0.07 K/uL  Basic metabolic panel  Result Value Ref Range   Sodium 139 135 - 145 mmol/L   Potassium 3.4 (L) 3.5 - 5.1 mmol/L   Chloride 106 98 - 111 mmol/L   CO2 27 22 - 32 mmol/L   Glucose, Bld 95 70 - 99 mg/dL   BUN 11 6 - 20 mg/dL   Creatinine, Ser 0.82 0.44 - 1.00 mg/dL   Calcium 9.4 8.9 - 10.3 mg/dL   GFR, Estimated >60 >60 mL/min   Anion gap 6 5 - 15  Troponin I (High Sensitivity)  Result Value Ref Range   Troponin I (High Sensitivity) 3 <18 ng/L   DG Chest Portable 1 View  Result Date: 01/17/2022 CLINICAL DATA:  Chest tightness EXAM: PORTABLE CHEST 1 VIEW COMPARISON:  07/03/2021 FINDINGS: Lungs are clear.  No pleural effusion or pneumothorax. The heart is normal in size. IMPRESSION: No evidence of acute cardiopulmonary disease. Electronically Signed   By: Julian Hy M.D.   On: 01/17/2022 01:22     EKG EKG Interpretation  Date/Time:  Thursday Jan 17 2022 00:24:15 EDT Ventricular Rate:  72 PR Interval:  159 QRS Duration: 81 QT Interval:  377 QTC Calculation: 413 R Axis:   80 Text Interpretation: Sinus rhythm RSR' in V1 or V2, right VCD or RVH Confirmed by Randal Buba, Luis Nickles ([id number]) on 01/17/2022 12:28:42 AM  Radiology DG Chest Portable 1 View  Result Date: 01/17/2022 CLINICAL DATA:  Chest tightness EXAM:  PORTABLE CHEST 1 VIEW COMPARISON:  07/03/2021 FINDINGS: Lungs are clear.  No pleural effusion or pneumothorax. The heart is normal in size. IMPRESSION: No evidence of acute cardiopulmonary disease. Electronically Signed   By: Julian Hy M.D.   On: 01/17/2022 01:22    Procedures Procedures    Medications Ordered in ED Medications  alum & mag hydroxide-simeth (MAALOX/MYLANTA) 200-200-20 MG/5ML suspension 30 mL (30 mLs Oral Given 01/17/22 0104)    ED Course/ Medical Decision Making/ A&P                           Medical Decision Making Chest tightness for over 24 hours while under stress   Amount and/or Complexity of Data Reviewed External Data Reviewed: notes.    Details: previous notes reviewed. Labs: ordered.    Details: all labs reviewed: normal white blood cell count 8.4 anemia with hemoglobin 11.8, normal platelet count.  Normal troponin 3. normal sodium 139, normal chloride.  Normal creatinine .58 Radiology: ordered and independent interpretation performed.    Details: no acute findings by me on CXR ECG/medicine tests: ordered and independent interpretation performed. Decision-making details documented in ED Course.  Risk OTC drugs. Risk Details: I suspect this is really anxiety brought on by what sounds like GERD.  Patient has been gassy since eating greasy foods last night. I will start a GERD friendly diet and PPI.  HEART score is 1 very low risk for MACE.  PERC negative wells 0 highly doubt PE in this low risk patient.      Final Clinical Impression(s) / ED Diagnoses Final diagnoses:  None  Return for intractable cough, coughing up blood, fevers > 100.4 unrelieved by medication, shortness of  breath, intractable vomiting, chest pain, shortness of breath, weakness, numbness, changes in speech, facial asymmetry, abdominal pain, passing out, Inability to tolerate liquids or food, cough, altered mental status or any concerns. No signs of systemic illness or infection. The  patient is nontoxic-appearing on exam and vital signs are within normal limits.  I have reviewed the triage vital signs and the nursing notes. Pertinent labs & imaging results that were available during my care of the patient were reviewed by me and considered in my medical decision making (see chart for details). After history, exam, and medical workup I feel the patient has been appropriately medically screened and is safe for discharge home. Pertinent diagnoses were discussed with the patient. Patient was given return precautions.   Rx / DC Orders ED Discharge Orders     None         Coral Soler, MD 01/17/22 3016

## 2022-01-17 NOTE — ED Notes (Signed)
ED Provider at bedside. 

## 2022-01-17 NOTE — ED Triage Notes (Signed)
Chest tigtness since 21:30 yesterday pain started 10:00 this morning. Pain not getting better here for evaluation.

## 2022-01-30 ENCOUNTER — Ambulatory Visit: Payer: BC Managed Care – PPO | Admitting: Family Medicine

## 2022-02-08 ENCOUNTER — Encounter (INDEPENDENT_AMBULATORY_CARE_PROVIDER_SITE_OTHER): Payer: Medicaid Other | Admitting: Family Medicine

## 2022-02-08 DIAGNOSIS — B001 Herpesviral vesicular dermatitis: Secondary | ICD-10-CM

## 2022-02-08 MED ORDER — VALACYCLOVIR HCL 1 G PO TABS: ORAL_TABLET | ORAL | 0 refills | Status: DC

## 2022-02-08 NOTE — Telephone Encounter (Signed)

## 2022-02-15 ENCOUNTER — Other Ambulatory Visit: Payer: Self-pay | Admitting: Family Medicine

## 2022-03-03 ENCOUNTER — Other Ambulatory Visit: Payer: Self-pay | Admitting: Family Medicine

## 2022-03-14 ENCOUNTER — Encounter: Payer: Self-pay | Admitting: Obstetrics & Gynecology

## 2022-03-14 ENCOUNTER — Other Ambulatory Visit: Payer: Self-pay

## 2022-03-14 MED ORDER — FLUCONAZOLE 150 MG PO TABS: 150.0000 mg | ORAL_TABLET | Freq: Once | ORAL | 1 refills | Status: AC

## 2022-03-18 ENCOUNTER — Other Ambulatory Visit: Payer: Self-pay | Admitting: Family Medicine

## 2022-03-22 ENCOUNTER — Ambulatory Visit (INDEPENDENT_AMBULATORY_CARE_PROVIDER_SITE_OTHER): Payer: Medicaid Other

## 2022-03-22 ENCOUNTER — Other Ambulatory Visit (HOSPITAL_COMMUNITY)
Admission: RE | Admit: 2022-03-22 | Discharge: 2022-03-22 | Disposition: A | Discharge status: Home / Self Care | Payer: Medicaid Other | Source: Ambulatory Visit | Attending: Obstetrics & Gynecology | Admitting: Obstetrics & Gynecology

## 2022-03-22 VITALS — BP 118/76 | HR 76

## 2022-03-22 DIAGNOSIS — Z113 Encounter for screening for infections with a predominantly sexual mode of transmission: Secondary | ICD-10-CM | POA: Insufficient documentation

## 2022-03-22 DIAGNOSIS — N898 Other specified noninflammatory disorders of vagina: Secondary | ICD-10-CM

## 2022-03-22 DIAGNOSIS — B9689 Other specified bacterial agents as the cause of diseases classified elsewhere: Secondary | ICD-10-CM

## 2022-03-22 NOTE — Progress Notes (Signed)
SUBJECTIVE:  46 y.o. female complains of vaginal irritation for a few weeks now.  Denies abnormal vaginal bleeding or significant pelvic pain or fever. No UTI symptoms. Denies history of known exposure to STD.  No LMP recorded. Patient has had a hysterectomy.  OBJECTIVE:  She appears well, afebrile. Urine dipstick: not done.  ASSESSMENT:  Vaginal irritation    PLAN:  GC, chlamydia, trichomonas, BVAG, CVAG probe sent to lab. Treatment: To be determined once lab results are received ROV prn if symptoms persist or worsen.

## 2022-03-25 LAB — CERVICOVAGINAL ANCILLARY ONLY
Bacterial Vaginitis (gardnerella): POSITIVE — AB
Candida Glabrata: NEGATIVE
Candida Vaginitis: NEGATIVE
Chlamydia: NEGATIVE
Comment: NEGATIVE
Comment: NEGATIVE
Comment: NEGATIVE
Comment: NEGATIVE
Comment: NEGATIVE
Comment: NORMAL
Neisseria Gonorrhea: NEGATIVE
Trichomonas: NEGATIVE

## 2022-03-25 MED ORDER — METRONIDAZOLE 500 MG PO TABS
500.0000 mg | ORAL_TABLET | Freq: Two times a day (BID) | ORAL | 0 refills | Status: AC
Start: 2022-03-25 — End: 2022-04-01

## 2022-03-25 NOTE — Addendum Note (Signed)
Addended by: Verita Schneiders A on: 03/25/2022 01:17 PM   Modules accepted: Orders

## 2022-04-10 ENCOUNTER — Other Ambulatory Visit: Payer: Self-pay | Admitting: Family Medicine

## 2022-04-12 IMAGING — CR DG ABDOMEN ACUTE W/ 1V CHEST
3 series · 3 of 3 positions shown · non-contrast
Comparison: 11/25/2019

CLINICAL DATA: 45-year-old female with abdominal pain and vomiting.

EXAM:
DG ABDOMEN ACUTE WITH 1 VIEW CHEST

[w chest pa]
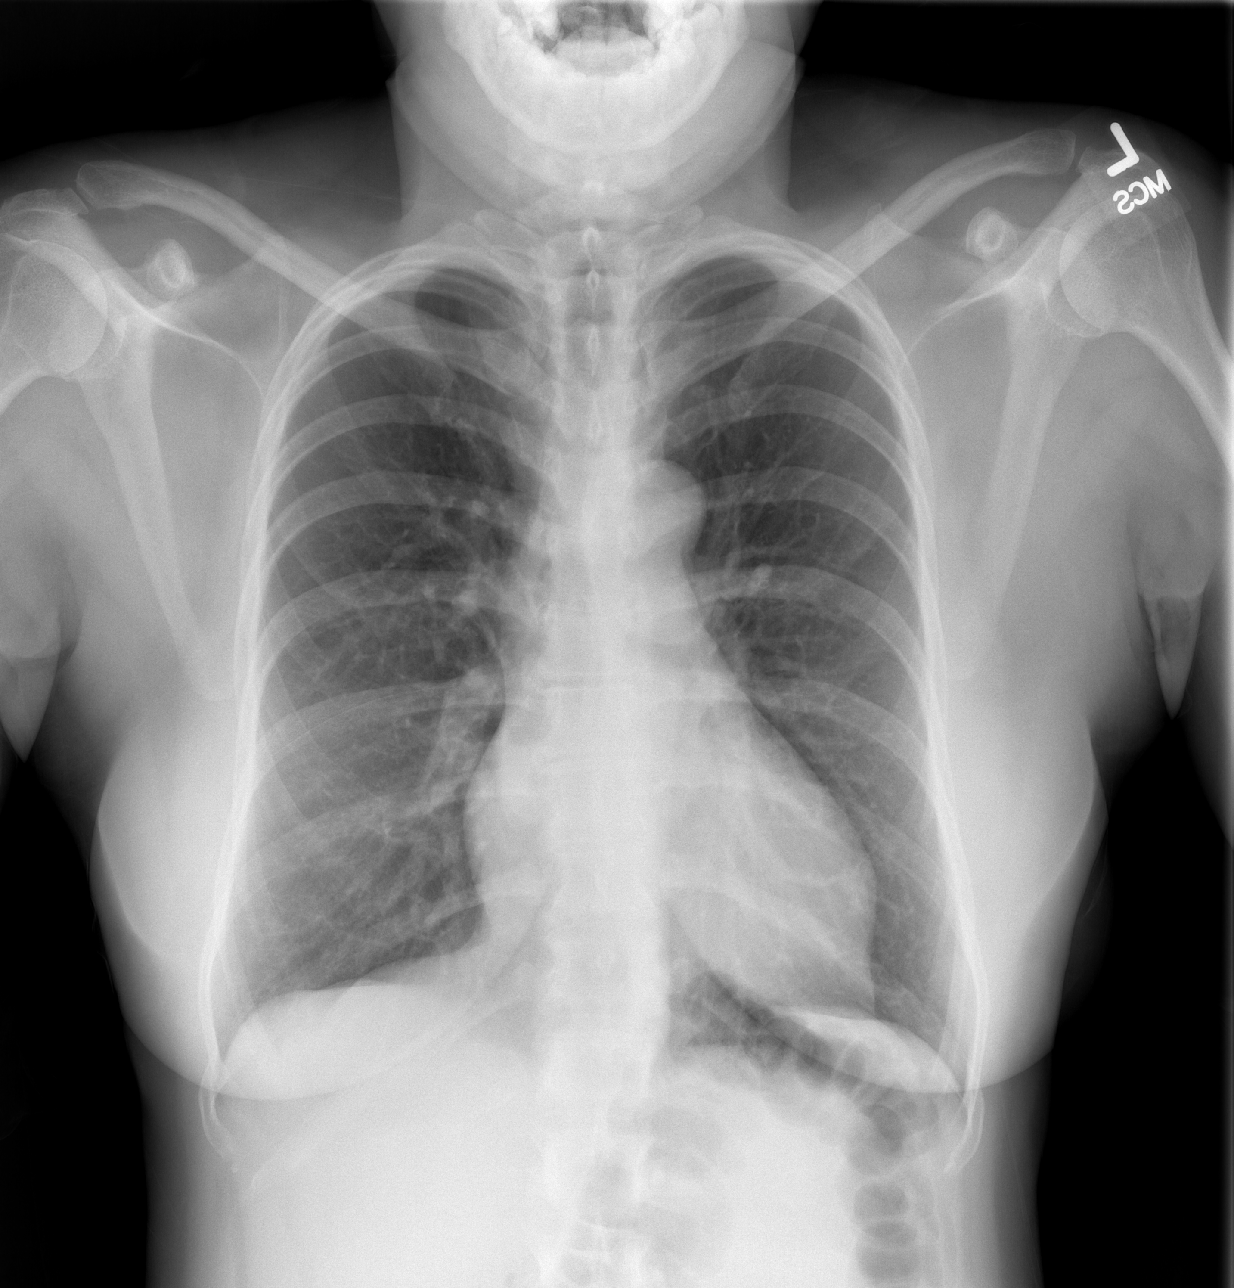

[w abdomen upright]
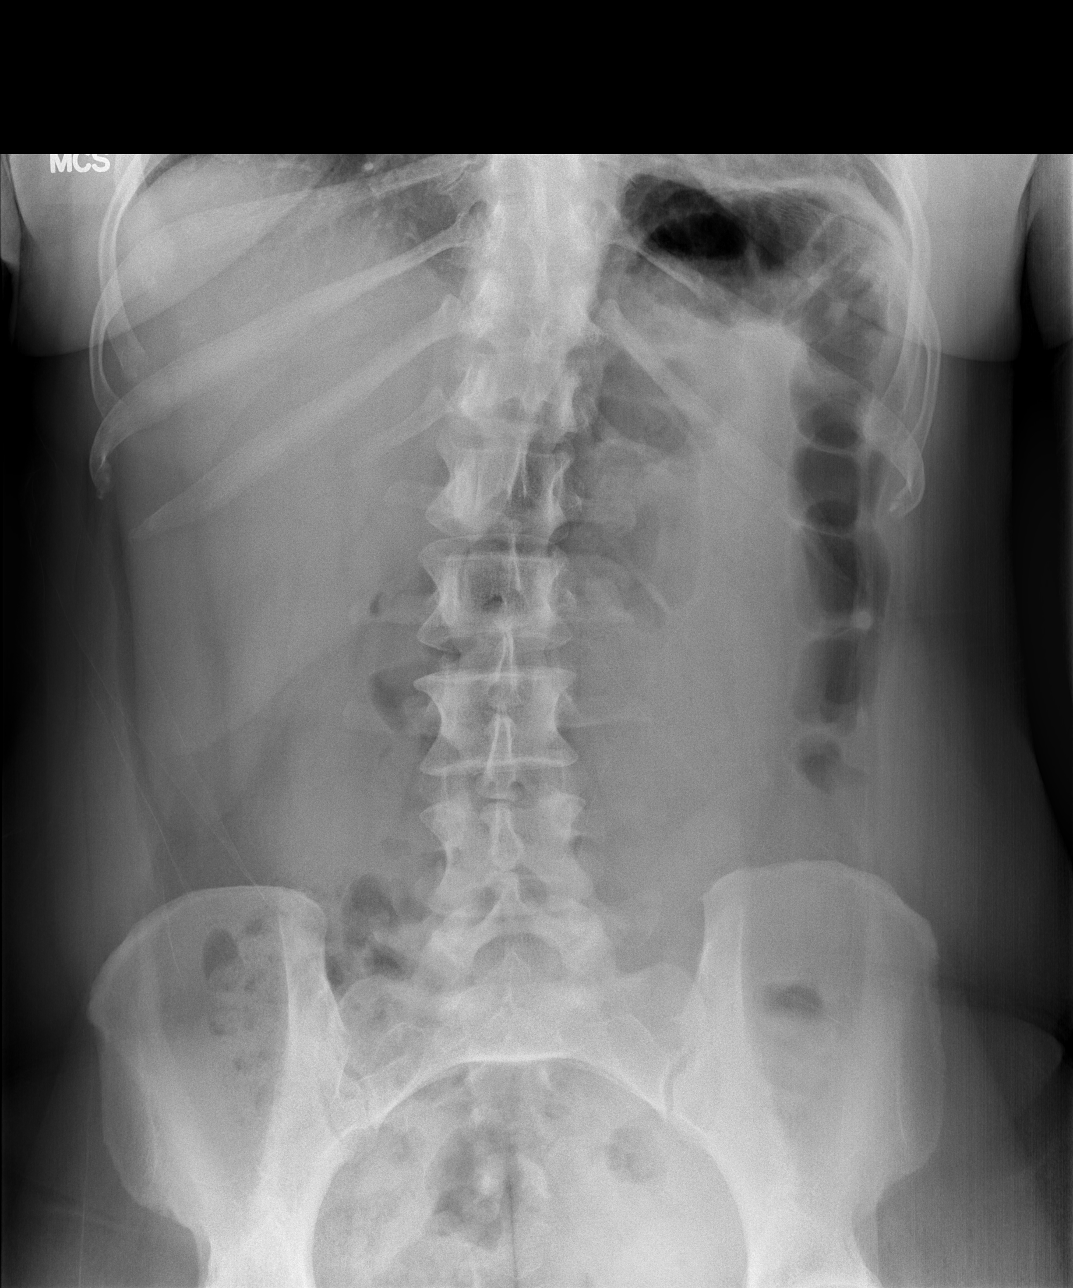

[t abdomen supine]
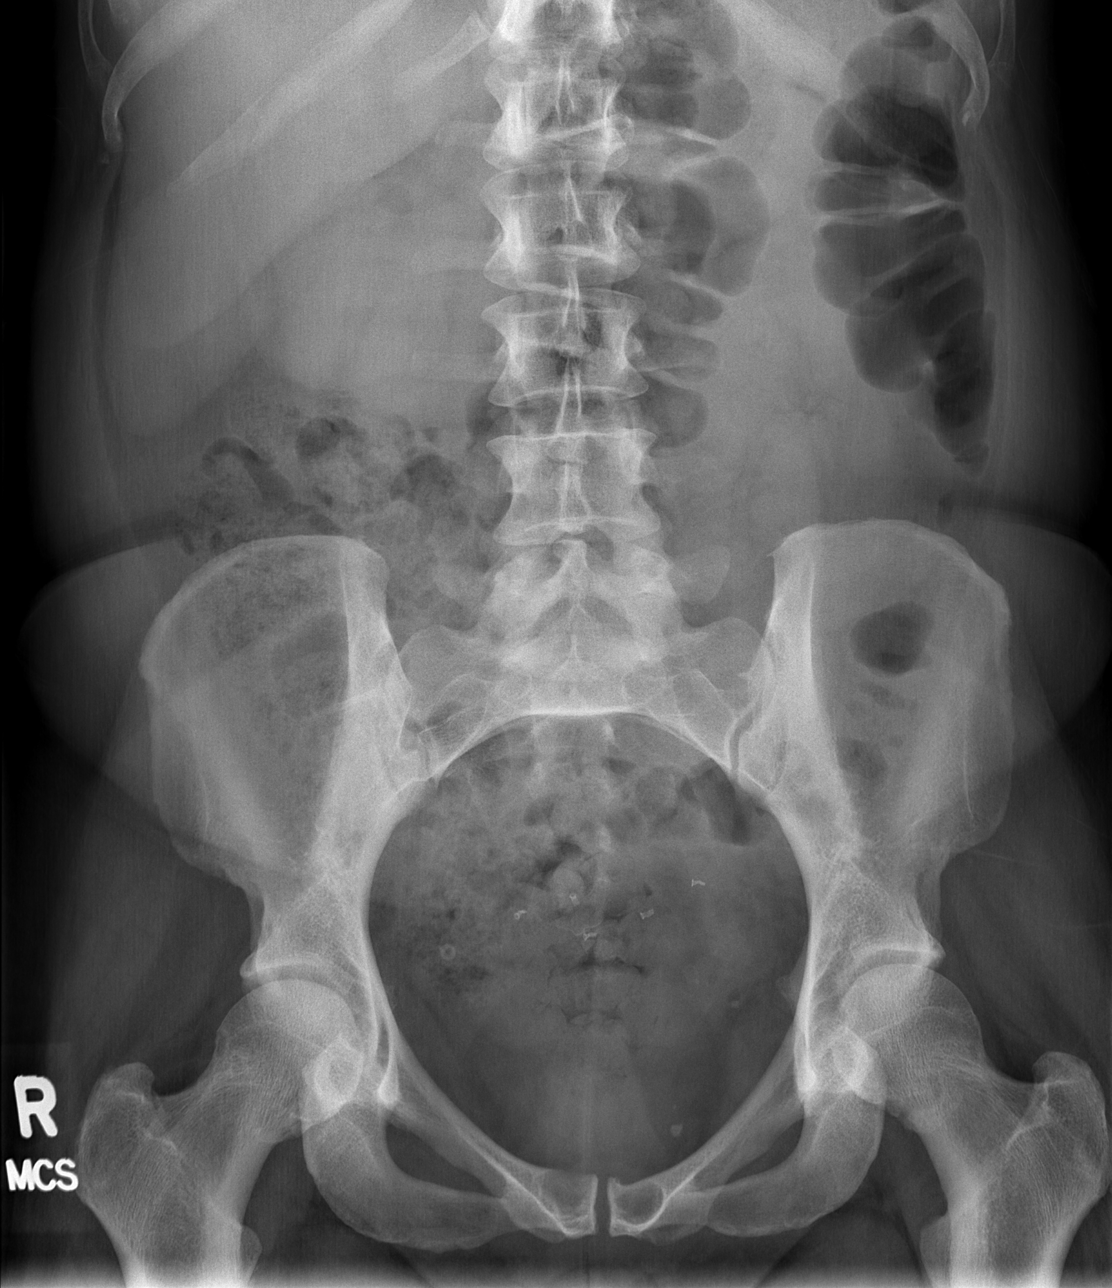

[3 of 3 positions shown; findings below may reference images not displayed]

FINDINGS: There is no evidence of dilated bowel loops or free intraperitoneal
air. No radiopaque calculi or other significant radiographic
abnormality is seen. Heart size and mediastinal contours are within
normal limits. Both lungs are clear.
IMPRESSION: Nonobstructive bowel gas pattern.  No acute cardiopulmonary disease.

## 2022-06-16 ENCOUNTER — Encounter (HOSPITAL_BASED_OUTPATIENT_CLINIC_OR_DEPARTMENT_OTHER): Payer: Self-pay | Admitting: Emergency Medicine

## 2022-06-16 ENCOUNTER — Other Ambulatory Visit: Payer: Self-pay

## 2022-06-16 DIAGNOSIS — Z91010 Allergy to peanuts: Secondary | ICD-10-CM | POA: Diagnosis not present

## 2022-06-16 DIAGNOSIS — R350 Frequency of micturition: Secondary | ICD-10-CM | POA: Insufficient documentation

## 2022-06-16 DIAGNOSIS — S39012A Strain of muscle, fascia and tendon of lower back, initial encounter: Secondary | ICD-10-CM | POA: Insufficient documentation

## 2022-06-16 DIAGNOSIS — M545 Low back pain, unspecified: Secondary | ICD-10-CM | POA: Diagnosis present

## 2022-06-16 DIAGNOSIS — Y92410 Unspecified street and highway as the place of occurrence of the external cause: Secondary | ICD-10-CM | POA: Diagnosis not present

## 2022-06-16 LAB — URINALYSIS, ROUTINE W REFLEX MICROSCOPIC
Bilirubin Urine: NEGATIVE
Glucose, UA: NEGATIVE mg/dL
Hgb urine dipstick: NEGATIVE
Ketones, ur: 15 mg/dL — AB
Leukocytes,Ua: NEGATIVE
Nitrite: NEGATIVE
Protein, ur: NEGATIVE mg/dL
Specific Gravity, Urine: 1.025 (ref 1.005–1.030)
pH: 5.5 (ref 5.0–8.0)
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
## 2022-06-17 ENCOUNTER — Emergency Department (HOSPITAL_BASED_OUTPATIENT_CLINIC_OR_DEPARTMENT_OTHER)
Admission: EM | Admit: 2022-06-17 | Discharge: 2022-06-17 | Disposition: A | Discharge status: Home / Self Care | Payer: Medicaid Other | Attending: Emergency Medicine | Admitting: Emergency Medicine

## 2022-06-17 DIAGNOSIS — S39012A Strain of muscle, fascia and tendon of lower back, initial encounter: Secondary | ICD-10-CM

## 2022-06-17 MED ORDER — METHOCARBAMOL 500 MG PO TABS: 500.0000 mg | ORAL_TABLET | Freq: Two times a day (BID) | ORAL | 0 refills | Status: DC

## 2022-06-17 MED ORDER — IBUPROFEN 400 MG PO TABS
600.0000 mg | ORAL_TABLET | Freq: Once | ORAL | Status: AC
  Administered 2022-06-17: 600 mg via ORAL
  Filled 2022-06-17: qty 1

## 2022-06-17 MED ORDER — IBUPROFEN 600 MG PO TABS: 600.0000 mg | ORAL_TABLET | Freq: Three times a day (TID) | ORAL | 0 refills | Status: DC | PRN
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
## 2022-06-21 ENCOUNTER — Ambulatory Visit: Payer: Medicaid Other | Admitting: Family Medicine

## 2022-06-25 ENCOUNTER — Ambulatory Visit: Payer: Medicaid Other | Admitting: Family Medicine

## 2022-06-25 ENCOUNTER — Encounter: Payer: Self-pay | Admitting: Family Medicine

## 2022-06-25 VITALS — BP 120/80 | HR 79 | Temp 98.8°F | Ht 65.0 in | Wt 168.1 lb

## 2022-06-25 DIAGNOSIS — M545 Low back pain, unspecified: Secondary | ICD-10-CM

## 2022-06-25 DIAGNOSIS — M79604 Pain in right leg: Secondary | ICD-10-CM

## 2022-06-25 MED ORDER — TIZANIDINE HCL 4 MG PO TABS: 4.0000 mg | ORAL_TABLET | Freq: Four times a day (QID) | ORAL | 0 refills | Status: DC | PRN

## 2022-06-25 NOTE — Patient Instructions (Signed)
Heat (pad or rice pillow in microwave) over affected area, 10-15 minutes at least 3 times daily, every hour if able.   Ice/cold pack over area for 10-15 min twice daily.  OK to take Tylenol 1000 mg (2 extra strength tabs) or 975 mg (3 regular strength tabs) every 6 hours as needed.  Ibuprofen 400-600 mg (2-3 over the counter strength tabs) every 6 hours as needed for pain.  Let us know if you need anything.  EXERCISES  RANGE OF MOTION (ROM) AND STRETCHING EXERCISES - Low Back Pain Most people with lower back pain will find that their symptoms get worse with excessive bending forward (flexion) or arching at the lower back (extension). The exercises that will help resolve your symptoms will focus on the opposite motion.  If you have pain, numbness or tingling which travels down into your buttocks, leg or foot, the goal of the therapy is for these symptoms to move closer to your back and eventually resolve. Sometimes, these leg symptoms will get better, but your lower back pain may worsen. This is often an indication of progress in your rehabilitation. Be very alert to any changes in your symptoms and the activities in which you participated in the 24 hours prior to the change. Sharing this information with your caregiver will allow him or her to most efficiently treat your condition. These exercises may help you when beginning to rehabilitate your injury. Your symptoms may resolve with or without further involvement from your physician, physical therapist or athletic trainer. While completing these exercises, remember:  Restoring tissue flexibility helps normal motion to return to the joints. This allows healthier, less painful movement and activity. An effective stretch should be held for at least 30 seconds. A stretch should never be painful. You should only feel a gentle lengthening or release in the stretched tissue. FLEXION RANGE OF MOTION AND STRETCHING EXERCISES:  STRETCH - Flexion, Single  Knee to Chest  Lie on a firm bed or floor with both legs extended in front of you. Keeping one leg in contact with the floor, bring your opposite knee to your chest. Hold your leg in place by either grabbing behind your thigh or at your knee. Pull until you feel a gentle stretch in your low back. Hold 30 seconds. Slowly release your grasp and repeat the exercise with the opposite side. Repeat 2 times. Complete this exercise 3 times per week.   STRETCH - Flexion, Double Knee to Chest Lie on a firm bed or floor with both legs extended in front of you. Keeping one leg in contact with the floor, bring your opposite knee to your chest. Tense your stomach muscles to support your back and then lift your other knee to your chest. Hold your legs in place by either grabbing behind your thighs or at your knees. Pull both knees toward your chest until you feel a gentle stretch in your low back. Hold 30 seconds. Tense your stomach muscles and slowly return one leg at a time to the floor. Repeat 2 times. Complete this exercise 3 times per week.   STRETCH - Low Trunk Rotation Lie on a firm bed or floor. Keeping your legs in front of you, bend your knees so they are both pointed toward the ceiling and your feet are flat on the floor. Extend your arms out to the side. This will stabilize your upper body by keeping your shoulders in contact with the floor. Gently and slowly drop both knees together to one  side until you feel a gentle stretch in your low back. Hold for 30 seconds. Tense your stomach muscles to support your lower back as you bring your knees back to the starting position. Repeat the exercise to the other side. Repeat 2 times. Complete this exercise at least 3 times per week.   EXTENSION RANGE OF MOTION AND FLEXIBILITY EXERCISES:  STRETCH - Extension, Prone on Elbows  Lie on your stomach on the floor, a bed will be too soft. Place your palms about shoulder width apart and at the height of your  head. Place your elbows under your shoulders. If this is too painful, stack pillows under your chest. Allow your body to relax so that your hips drop lower and make contact more completely with the floor. Hold this position for 30 seconds. Slowly return to lying flat on the floor. Repeat 2 times. Complete this exercise 3 times per week.   RANGE OF MOTION - Extension, Prone Press Ups Lie on your stomach on the floor, a bed will be too soft. Place your palms about shoulder width apart and at the height of your head. Keeping your back as relaxed as possible, slowly straighten your elbows while keeping your hips on the floor. You may adjust the placement of your hands to maximize your comfort. As you gain motion, your hands will come more underneath your shoulders. Hold this position 30 seconds. Slowly return to lying flat on the floor. Repeat 2 times. Complete this exercise 3 times per week.   RANGE OF MOTION- Quadruped, Neutral Spine  Assume a hands and knees position on a firm surface. Keep your hands under your shoulders and your knees under your hips. You may place padding under your knees for comfort. Drop your head and point your tailbone toward the ground below you. This will round out your lower back like an angry cat. Hold this position for 30 seconds. Slowly lift your head and release your tail bone so that your back sags into a large arch, like an old horse. Hold this position for 30 seconds. Repeat this until you feel limber in your low back. Now, find your "sweet spot." This will be the most comfortable position somewhere between the two previous positions. This is your neutral spine. Once you have found this position, tense your stomach muscles to support your low back. Hold this position for 30 seconds. Repeat 2 times. Complete this exercise 3 times per week.   STRENGTHENING EXERCISES - Low Back Sprain These exercises may help you when beginning to rehabilitate your injury. These  exercises should be done near your "sweet spot." This is the neutral, low-back arch, somewhere between fully rounded and fully arched, that is your least painful position. When performed in this safe range of motion, these exercises can be used for people who have either a flexion or extension based injury. These exercises may resolve your symptoms with or without further involvement from your physician, physical therapist or athletic trainer. While completing these exercises, remember:  Muscles can gain both the endurance and the strength needed for everyday activities through controlled exercises. Complete these exercises as instructed by your physician, physical therapist or athletic trainer. Increase the resistance and repetitions only as guided. You may experience muscle soreness or fatigue, but the pain or discomfort you are trying to eliminate should never worsen during these exercises. If this pain does worsen, stop and make certain you are following the directions exactly. If the pain is still present after adjustments,  discontinue the exercise until you can discuss the trouble with your caregiver.  STRENGTHENING - Deep Abdominals, Pelvic Tilt  Lie on a firm bed or floor. Keeping your legs in front of you, bend your knees so they are both pointed toward the ceiling and your feet are flat on the floor. Tense your lower abdominal muscles to press your low back into the floor. This motion will rotate your pelvis so that your tail bone is scooping upwards rather than pointing at your feet or into the floor. With a gentle tension and even breathing, hold this position for 3 seconds. Repeat 2 times. Complete this exercise 3 times per week.   STRENGTHENING - Abdominals, Crunches  Lie on a firm bed or floor. Keeping your legs in front of you, bend your knees so they are both pointed toward the ceiling and your feet are flat on the floor. Cross your arms over your chest. Slightly tip your chin down  without bending your neck. Tense your abdominals and slowly lift your trunk high enough to just clear your shoulder blades. Lifting higher can put excessive stress on the lower back and does not further strengthen your abdominal muscles. Control your return to the starting position. Repeat 2 times. Complete this exercise 3 times per week.   STRENGTHENING - Quadruped, Opposite UE/LE Lift  Assume a hands and knees position on a firm surface. Keep your hands under your shoulders and your knees under your hips. You may place padding under your knees for comfort. Find your neutral spine and gently tense your abdominal muscles so that you can maintain this position. Your shoulders and hips should form a rectangle that is parallel with the floor and is not twisted. Keeping your trunk steady, lift your right hand no higher than your shoulder and then your left leg no higher than your hip. Make sure you are not holding your breath. Hold this position for 30 seconds. Continuing to keep your abdominal muscles tense and your back steady, slowly return to your starting position. Repeat with the opposite arm and leg. Repeat 2 times. Complete this exercise 3 times per week.   STRENGTHENING - Abdominals and Quadriceps, Straight Leg Raise  Lie on a firm bed or floor with both legs extended in front of you. Keeping one leg in contact with the floor, bend the other knee so that your foot can rest flat on the floor. Find your neutral spine, and tense your abdominal muscles to maintain your spinal position throughout the exercise. Slowly lift your straight leg off the floor about 6 inches for a count of 3, making sure to not hold your breath. Still keeping your neutral spine, slowly lower your leg all the way to the floor. Repeat this exercise with each leg 2 times. Complete this exercise 3 times per week.  POSTURE AND BODY MECHANICS CONSIDERATIONS - Low Back Sprain Keeping correct posture when sitting, standing or  completing your activities will reduce the stress put on different body tissues, allowing injured tissues a chance to heal and limiting painful experiences. The following are general guidelines for improved posture.  While reading these guidelines, remember: The exercises prescribed by your provider will help you have the flexibility and strength to maintain correct postures. The correct posture provides the best environment for your joints to work. All of your joints have less wear and tear when properly supported by a spine with good posture. This means you will experience a healthier, less painful body. Correct posture must  be practiced with all of your activities, especially prolonged sitting and standing. Correct posture is as important when doing repetitive low-stress activities (typing) as it is when doing a single heavy-load activity (lifting).  RESTING POSITIONS Consider which positions are most painful for you when choosing a resting position. If you have pain with flexion-based activities (sitting, bending, stooping, squatting), choose a position that allows you to rest in a less flexed posture. You would want to avoid curling into a fetal position on your side. If your pain worsens with extension-based activities (prolonged standing, working overhead), avoid resting in an extended position such as sleeping on your stomach. Most people will find more comfort when they rest with their spine in a more neutral position, neither too rounded nor too arched. Lying on a non-sagging bed on your side with a pillow between your knees, or on your back with a pillow under your knees will often provide some relief. Keep in mind, being in any one position for a prolonged period of time, no matter how correct your posture, can still lead to stiffness.  PROPER SITTING POSTURE In order to minimize stress and discomfort on your spine, you must sit with correct posture. Sitting with good posture should be  effortless for a healthy body. Returning to good posture is a gradual process. Many people can work toward this most comfortably by using various supports until they have the flexibility and strength to maintain this posture on their own. When sitting with proper posture, your ears will fall over your shoulders and your shoulders will fall over your hips. You should use the back of the chair to support your upper back. Your lower back will be in a neutral position, just slightly arched. You may place a small pillow or folded towel at the base of your lower back for  support.  When working at a desk, create an environment that supports good, upright posture. Without extra support, muscles tire, which leads to excessive strain on joints and other tissues. Keep these recommendations in mind:  CHAIR: A chair should be able to slide under your desk when your back makes contact with the back of the chair. This allows you to work closely. The chair's height should allow your eyes to be level with the upper part of your monitor and your hands to be slightly lower than your elbows.  BODY POSITION Your feet should make contact with the floor. If this is not possible, use a foot rest. Keep your ears over your shoulders. This will reduce stress on your neck and low back.  INCORRECT SITTING POSTURES  If you are feeling tired and unable to assume a healthy sitting posture, do not slouch or slump. This puts excessive strain on your back tissues, causing more damage and pain. Healthier options include: Using more support, like a lumbar pillow. Switching tasks to something that requires you to be upright or walking. Talking a brief walk. Lying down to rest in a neutral-spine position.  PROLONGED STANDING WHILE SLIGHTLY LEANING FORWARD  When completing a task that requires you to lean forward while standing in one place for a long time, place either foot up on a stationary 2-4 inch high object to help maintain  the best posture. When both feet are on the ground, the lower back tends to lose its slight inward curve. If this curve flattens (or becomes too large), then the back and your other joints will experience too much stress, tire more quickly, and can cause  pain.  CORRECT STANDING POSTURES Proper standing posture should be assumed with all daily activities, even if they only take a few moments, like when brushing your teeth. As in sitting, your ears should fall over your shoulders and your shoulders should fall over your hips. You should keep a slight tension in your abdominal muscles to brace your spine. Your tailbone should point down to the ground, not behind your body, resulting in an over-extended swayback posture.   INCORRECT STANDING POSTURES  Common incorrect standing postures include a forward head, locked knees and/or an excessive swayback. WALKING Walk with an upright posture. Your ears, shoulders and hips should all line-up.  PROLONGED ACTIVITY IN A FLEXED POSITION When completing a task that requires you to bend forward at your waist or lean over a low surface, try to find a way to stabilize 3 out of 4 of your limbs. You can place a hand or elbow on your thigh or rest a knee on the surface you are reaching across. This will provide you more stability, so that your muscles do not tire as quickly. By keeping your knees relaxed, or slightly bent, you will also reduce stress across your lower back. CORRECT LIFTING TECHNIQUES  DO : Assume a wide stance. This will provide you more stability and the opportunity to get as close as possible to the object which you are lifting. Tense your abdominals to brace your spine. Bend at the knees and hips. Keeping your back locked in a neutral-spine position, lift using your leg muscles. Lift with your legs, keeping your back straight. Test the weight of unknown objects before attempting to lift them. Try to keep your elbows locked down at your sides in  order get the best strength from your shoulders when carrying an object.   Always ask for help when lifting heavy or awkward objects. INCORRECT LIFTING TECHNIQUES DO NOT:  Lock your knees when lifting, even if it is a small object. Bend and twist. Pivot at your feet or move your feet when needing to change directions. Assume that you can safely pick up even a paperclip without proper posture.
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
## 2022-08-26 ENCOUNTER — Other Ambulatory Visit: Payer: Self-pay | Admitting: Family Medicine

## 2022-08-26 DIAGNOSIS — J4540 Moderate persistent asthma, uncomplicated: Secondary | ICD-10-CM

## 2022-09-02 ENCOUNTER — Ambulatory Visit: Payer: Medicaid Other | Admitting: Family Medicine

## 2022-09-02 ENCOUNTER — Ambulatory Visit (HOSPITAL_BASED_OUTPATIENT_CLINIC_OR_DEPARTMENT_OTHER)
Admission: RE | Admit: 2022-09-02 | Discharge: 2022-09-02 | Disposition: A | Discharge status: Home / Self Care | Payer: Medicaid Other | Source: Ambulatory Visit | Attending: Family Medicine | Admitting: Family Medicine

## 2022-09-02 ENCOUNTER — Encounter: Payer: Self-pay | Admitting: Family Medicine

## 2022-09-02 VITALS — BP 118/72 | HR 78 | Temp 98.2°F | Resp 16 | Ht 65.0 in | Wt 171.6 lb

## 2022-09-02 DIAGNOSIS — M79604 Pain in right leg: Secondary | ICD-10-CM | POA: Diagnosis not present

## 2022-09-02 DIAGNOSIS — I878 Other specified disorders of veins: Secondary | ICD-10-CM | POA: Diagnosis not present

## 2022-09-02 DIAGNOSIS — Z23 Encounter for immunization: Secondary | ICD-10-CM | POA: Diagnosis not present

## 2022-09-02 DIAGNOSIS — Z9889 Other specified postprocedural states: Secondary | ICD-10-CM | POA: Diagnosis not present

## 2022-09-02 DIAGNOSIS — M25751 Osteophyte, right hip: Secondary | ICD-10-CM | POA: Diagnosis not present

## 2022-09-02 DIAGNOSIS — M25551 Pain in right hip: Secondary | ICD-10-CM | POA: Insufficient documentation

## 2022-09-02 DIAGNOSIS — M545 Low back pain, unspecified: Secondary | ICD-10-CM | POA: Diagnosis not present

## 2022-09-02 MED ORDER — TRIAMCINOLONE ACETONIDE 0.1 % EX CREA: 1.0000 | TOPICAL_CREAM | Freq: Two times a day (BID) | CUTANEOUS | 0 refills | Status: DC

## 2022-09-02 NOTE — Addendum Note (Signed)
Addended by: Randolm Idol A on: 09/02/2022 11:43 AM   Modules accepted: Orders

## 2022-09-02 NOTE — Patient Instructions (Addendum)
Heat (pad or rice pillow in microwave) over affected area, 10-15 minutes twice daily.   Ice/cold pack over area for 10-15 min twice daily.  OK to take Tylenol 1000 mg (2 extra strength tabs) or 975 mg (3 regular strength tabs) every 6 hours as needed.  If you do not hear anything about your referral in the next 1-2 weeks, call our office and ask for an update.  Use hydrocortisone over your face at 1% or 0.5%.   Try not to scratch as this can make things worse. Avoid scented products while dealing with this. You may resume when the itchiness resolves. Cold/cool compresses can help.   Let us know if you need anything.  Hip Exercises It is normal to feel mild stretching, pulling, tightness, or discomfort as you do these exercises, but you should stop right away if you feel sudden pain or your pain gets worse.   STRETCHING AND RANGE OF MOTION EXERCISES These exercises warm up your muscles and joints and improve the movement and flexibility of your hip. These exercises also help to relieve pain, numbness, and tingling. Exercise A: Hamstrings, Supine  Lie on your back. Loop a belt or towel over the ball of your left / right foot. The ball of your foot is on the walking surface, right under your toes. Straighten your left / right knee and slowly pull on the belt to raise your leg. Do not let your left / right knee bend while you do this. Keep your other leg flat on the floor. Raise the left / right leg until you feel a gentle stretch behind your left / right knee or thigh. Hold this position for 30 seconds. Slowly return your leg to the starting position. Repeat2 times. Complete this stretch 3 times per week. Exercise B: Hip Rotators  Lie on your back on a firm surface. Hold your left / right knee with your left / right hand. Hold your ankle with your other hand. Gently pull your left / right knee and rotate your lower leg toward your other shoulder. Pull until you feel a stretch in your  buttocks. Keep your hips and shoulders firmly planted while you do this stretch. Hold this position for 30 seconds. Repeat 2 times. Complete this stretch 3 times per week. Exercise C: V-Sit (Hamstrings and Adductors)  Sit on the floor with your legs extended in a large "V" shape. Keep your knees straight during this exercise. Start with your head and chest upright, then bend at your waist to reach for your left foot (position A). You should feel a stretch in your right inner thigh. Hold this position for 30 seconds. Then slowly return to the upright position. Bend at your waist to reach forward (position B). You should feel a stretch behind both of your thighs and knees. Hold this position for 30 seconds. Then slowly return to the upright position. Bend at your waist to reach for your right foot (position C). You should feel a stretch in your left inner thigh. Hold this position for 30 seconds. Then slowly return to the upright position. Repeat A, B, and C 2 times each. Complete this stretch 3 times per week. Exercise D: Lunge (Hip Flexors)  Place your left / right knee on the floor and bend your other knee so that is directly over your ankle. You should be half-kneeling. Keep good posture with your head over your shoulders. Tighten your buttocks to point your tailbone downward. This helps your back to  keep from arching too much. You should feel a gentle stretch in the front of your left / right thigh and hip. If you do not feel any resistance, slightly slide your other foot forward and then slowly lunge forward so your knee once again lines up over your ankle. Make sure your tailbone continues to point downward. Hold this position for 30 seconds. Repeat 2 times. Complete this stretch 3 times per week.  STRENGTHENING EXERCISES These exercises build strength and endurance in your hip. Endurance is the ability to use your muscles for a long time, even after they get tired. Exercise E: Bridge  (Hip Extensors)  Lie on your back on a firm surface with your knees bent and your feet flat on the floor. Tighten your buttocks muscles and lift your bottom off the floor until the trunk of your body is level with your thighs. Do not arch your back. You should feel the muscles working in your buttocks and the back of your thighs. If you do not feel these muscles, slide your feet 1-2 inches (2.5-5 cm) farther away from your buttocks. Hold this position for 3 seconds. Slowly lower your hips to the starting position. Repeat for a total of 10 repetitions. Let your muscles relax completely between repetitions. If this exercise is too easy, try doing it with your arms crossed over your chest. Repeat 2 times. Complete this exercise 3 times per week. Exercise F: Straight Leg Raises - Hip Abductors  Lie on your side with your left / right leg in the top position. Lie so your head, shoulder, knee, and hip line up with each other. You may bend your bottom knee to help you balance. Roll your hips slightly forward, so your hips are stacked directly over each other and your left / right knee is facing forward. Leading with your heel, lift your top leg 4-6 inches (10-15 cm). You should feel the muscles in your outer hip lifting. Do not let your foot drift forward. Do not let your knee roll toward the ceiling. Hold this position for 1 second. Slowly return to the starting position. Let your muscles relax completely between repetitions. Repeat for a total of 10 repetitions.  Repeat 2 times. Complete this exercise 3 times per week. Exercise G: Straight Leg Raises - Hip Adductors  Lie on your side with your left / right leg in the bottom position. Lie so your head, shoulder, knee, and hip line up. You may place your upper foot in front to help you balance. Roll your hips slightly forward, so your hips are stacked directly over each other and your left / right knee is facing forward. Tense the muscles in your  inner thigh and lift your bottom leg 4-6 inches (10-15 cm). Hold this position for 1 second. Slowly return to the starting position. Let your muscles relax completely between repetitions. Repeat for a total of 10 repetitions. Repeat 2 times. Complete this exercise 3 times per week. Exercise H: Straight Leg Raises - Quadriceps  Lie on your back with your left / right leg extended and your other knee bent. Tense the muscles in the front of your left / right thigh. When you do this, you should see your kneecap slide up or see increased dimpling just above your knee. Tighten these muscles even more and raise your leg 4-6 inches (10-15 cm) off the floor. Hold this position for 3 seconds. Keep these muscles tense as you lower your leg. Relax the muscles slowly and  completely between repetitions. Repeat for a total of 10 repetitions. Repeat 2 times. Complete this exercise 3 times per week. Exercise I: Hip Abductors, Standing Tie one end of a rubber exercise band or tubing to a secure surface, such as a table or pole. Loop the other end of the band or tubing around your left / right ankle. Keeping your ankle with the band or tubing directly opposite of the secured end, step away until there is tension in the tubing or band. Hold onto a chair as needed for balance. Lift your left / right leg out to your side. While you do this: Keep your back upright. Keep your shoulders over your hips. Keep your toes pointing forward. Make sure to use your hip muscles to lift your leg. Do not "throw" your leg or tip your body to lift your leg. Hold this position for 1 second. Slowly return to the starting position. Repeat for a total of 10 repetitions. Repeat 2 times. Complete this exercise 3 times per week. Exercise J: Squats (Quadriceps) Stand in a door frame so your feet and knees are in line with the frame. You may place your hands on the frame for balance. Slowly bend your knees and lower your hips like you  are going to sit in a chair. Keep your lower legs in a straight-up-and-down position. Do not let your hips go lower than your knees. Do not bend your knees lower than told by your health care provider. If your hip pain increases, do not bend as low. Hold this position for 1 second. Slowly push with your legs to return to standing. Do not use your hands to pull yourself to standing. Repeat for a total of 10 repetitions. Repeat 2 times. Complete this exercise 3 times per week. Make sure you discuss any questions you have with your health care provider. Document Released: 08/30/2005 Document Revised: 05/06/2016 Document Reviewed: 08/07/2015 Elsevier Interactive Patient Education  [employer].

## 2022-09-02 NOTE — Progress Notes (Signed)
Musculoskeletal Exam  Patient: Kimberly Ali DOB: 02/24/1976  DOS: 09/02/2022  SUBJECTIVE:  Chief Complaint:   Chief Complaint  Patient presents with   Back Pain    Radiates down leg     Kimberly Ali is a 47 y.o.  female for evaluation and treatment of back pain.   Onset:  1 week ago.  No inj or change in activity.  Location: lower R, radiating into R thigh Character:  aching  Progression of issue:  is unchanged Associated symptoms: radiating down RLE Denies bowel/bladder incontinence, bruising, redness, swelling, falls or weakness Treatment: to date has been OTC NSAIDS and muscle relaxers.   Neurovascular symptoms: no  Past Medical History:  Diagnosis Date   Allergy    hayfever   Anal skin tag    Eczema    Headache    History of hay fever    History of migraine    Hypertension    Moderate persistent asthma 12/20/2016   Seizures ([place])    11-25-19- one time occurence, asthma induced, none since   Urticaria     Objective:  VITAL SIGNS: BP 118/72   Pulse 78   Temp 98.2 F (36.8 C)   Resp 16   Ht '5\' 5"'$  (1.651 m)   Wt 171 lb 9.6 oz (77.8 kg)   SpO2 98%   BMI 28.56 kg/m  Constitutional: Well formed, well developed. No acute distress. HENT: Normocephalic, atraumatic.  Thorax & Lungs:  No accessory muscle use Musculoskeletal: low back.   Tenderness to palpation: yes over lateral lumbar region on R and also R hip flexor Deformity: no Ecchymosis: no Straight leg test: negative for +Stinchfield Neg FADDIR, DABER, log roll Poor hamstring flexibility b/l. Neurologic: Normal sensory function. No focal deficits noted. DTR's equal and symmetric in LE's. No clonus. 5/5 strength in LE's b/l Psychiatric: Normal mood. Age appropriate judgment and insight. Alert & oriented x 3.    Assessment:  Low back pain radiating to right leg - Plan: Ambulatory referral to Physical Therapy  Hip pain, right - Plan: DG HIP UNILAT WITH PELVIS 2-3 VIEWS RIGHT,  Ambulatory referral to Physical Therapy  Plan: Stretches/exercises, heat, ice, Tylenol, NSAIDs. Refer PT. Ck XR to exlude OA.  Flu shot declined. Tdap today.  F/u prn. The patient voiced understanding and agreement to the plan.   [place], DO 09/02/22  8:36 AM

## 2022-09-06 ENCOUNTER — Other Ambulatory Visit: Payer: Self-pay | Admitting: Family Medicine

## 2022-09-06 DIAGNOSIS — Z1231 Encounter for screening mammogram for malignant neoplasm of breast: Secondary | ICD-10-CM
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
## 2022-09-12 ENCOUNTER — Ambulatory Visit: Payer: Medicaid Other | Attending: Family Medicine | Admitting: Physical Therapy

## 2022-09-12 ENCOUNTER — Encounter: Payer: Self-pay | Admitting: Physical Therapy

## 2022-09-12 DIAGNOSIS — R262 Difficulty in walking, not elsewhere classified: Secondary | ICD-10-CM | POA: Diagnosis not present

## 2022-09-12 DIAGNOSIS — M6281 Muscle weakness (generalized): Secondary | ICD-10-CM | POA: Diagnosis not present

## 2022-09-12 DIAGNOSIS — M79604 Pain in right leg: Secondary | ICD-10-CM | POA: Diagnosis not present

## 2022-09-12 DIAGNOSIS — M25551 Pain in right hip: Secondary | ICD-10-CM | POA: Insufficient documentation

## 2022-09-12 DIAGNOSIS — R278 Other lack of coordination: Secondary | ICD-10-CM | POA: Diagnosis not present

## 2022-09-12 DIAGNOSIS — M5459 Other low back pain: Secondary | ICD-10-CM | POA: Diagnosis not present

## 2022-09-12 DIAGNOSIS — M545 Low back pain, unspecified: Secondary | ICD-10-CM | POA: Insufficient documentation

## 2022-09-13 ENCOUNTER — Ambulatory Visit
Admission: RE | Admit: 2022-09-13 | Discharge: 2022-09-13 | Disposition: A | Discharge status: Home / Self Care | Payer: Medicaid Other | Source: Ambulatory Visit | Attending: Family Medicine | Admitting: Family Medicine

## 2022-09-13 DIAGNOSIS — Z1231 Encounter for screening mammogram for malignant neoplasm of breast: Secondary | ICD-10-CM | POA: Diagnosis not present

## 2022-09-19 ENCOUNTER — Ambulatory Visit: Payer: Medicaid Other | Admitting: Physical Therapy

## 2022-09-19 DIAGNOSIS — R278 Other lack of coordination: Secondary | ICD-10-CM | POA: Diagnosis not present

## 2022-09-19 DIAGNOSIS — M545 Low back pain, unspecified: Secondary | ICD-10-CM | POA: Diagnosis not present

## 2022-09-19 DIAGNOSIS — M5459 Other low back pain: Secondary | ICD-10-CM

## 2022-09-19 DIAGNOSIS — M25551 Pain in right hip: Secondary | ICD-10-CM | POA: Diagnosis not present

## 2022-09-19 DIAGNOSIS — M6281 Muscle weakness (generalized): Secondary | ICD-10-CM | POA: Diagnosis not present

## 2022-09-19 DIAGNOSIS — M79604 Pain in right leg: Secondary | ICD-10-CM | POA: Diagnosis not present

## 2022-09-19 DIAGNOSIS — R262 Difficulty in walking, not elsewhere classified: Secondary | ICD-10-CM

## 2022-09-19 NOTE — Therapy (Signed)
OUTPATIENT PHYSICAL THERAPY THORACOLUMBAR    Patient Name: Kimberly Ali MRN: [id number] DOB:1975/09/21, 47 y.o., female Today's Date: 09/19/2022  END OF SESSION:  PT End of Session - 09/19/22 1415     Visit Number 2    Date for PT Re-Evaluation 12/05/22    PT Start Time 4403    PT Stop Time 4742    PT Time Calculation (min) 32 min             Past Medical History:  Diagnosis Date   Allergy    hayfever   Anal skin tag    Eczema    Headache    History of hay fever    History of migraine    Hypertension    Moderate persistent asthma 12/20/2016   Seizures ([place])    11-25-19- one time occurence, asthma induced, none since   Urticaria    Past Surgical History:  Procedure Laterality Date   ABDOMINAL HYSTERECTOMY     Fibroids   CERVIX SURGERY     x2   DILATION AND CURETTAGE OF UTERUS     EXCISION OF SKIN TAG N/A 07/23/2017   Procedure: EXCISION OF SKIN TAG;  Surgeon: Clovis Riley, MD;  Location: [place];  Service: General;  Laterality: N/A;   [place]  2018   states laser surgery for one hemorrhoid   Patient Active Problem List   Diagnosis Date Noted   GAD (generalized anxiety disorder) 07/04/2021   OSA (obstructive sleep apnea) 07/17/2020   Healthcare maintenance 07/17/2020   Seasonal and perennial allergic rhinoconjunctivitis 03/20/2020   Severe persistent asthma without complication 59/56/3875   Adverse food reaction 02/17/2020   Other atopic dermatitis 02/17/2020   Cervical spondylosis 09/03/2019   Vestibular migraine 08/18/2019   Moderate persistent asthma 12/20/2016    PCP: Shelda Pal, DO  REFERRING PROVIDER: Shelda Pal, DO  REFERRING DIAG:  M54.50,M79.604 (ICD-10-CM) - Low back pain radiating to right leg  M25.551 (ICD-10-CM) - Hip pain, right    Rationale for Evaluation and Treatment: Rehabilitation  THERAPY DIAG:  Difficulty in walking, not elsewhere classified  Muscle  weakness (generalized)  Other low back pain  ONSET DATE: 09/02/22  SUBJECTIVE:                                                                                                                                                                                           SUBJECTIVE STATEMENT: Patient reports chronic LBP. Radiates into RT thigh- comes and goes without reason. Denies radiating pain at the moment   PERTINENT HISTORY:  Patient reports that she was told she has  scoliosis in the past, with her hips uneven.  Per Dr visit Daron Breeding is a 47 y.o.  female for evaluation and treatment of back pain.    Onset:  1 week ago.  No inj or change in activity.  Location: lower R, radiating into R thigh Character:  aching  Progression of issue:  is unchanged Associated symptoms: radiating down RLE Denies bowel/bladder incontinence, bruising, redness, swelling, falls or weakness Treatment: to date has been OTC NSAIDS and muscle relaxers.   Neurovascular symptoms: no  Musculoskeletal: low back.   Tenderness to palpation: yes over lateral lumbar region on R and also R hip flexor Deformity: no Ecchymosis: no Straight leg test: negative for +Stinchfield Neg FADDIR, DABER, log roll Poor hamstring flexibility b/l  PAIN:  Are you having pain? Yes: NPRS scale: 6/10 Pain location: Starts in R low back, goes into the back and front of her R leg, also hurting in the groin. Pain description: throbbing, aching Aggravating factors: Sitting, standing, lying down Relieving factors: none  PRECAUTIONS: None  WEIGHT BEARING RESTRICTIONS: No  FALLS:  Has patient fallen in last 6 months? 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 She walks and stretches as much as possible.  PLOF: Independent  PATIENT GOALS: Decrease pain, be  able to sleep, stretch out the tightness.  NEXT MD VISIT:   OBJECTIVE:   DIAGNOSTIC FINDINGS:  Hip and pelvis Xray Mild bilateral femoroacetabular osteoarthritis. No acute fracture.   SCREENING FOR RED FLAGS: Bowel or bladder incontinence: No Spinal tumors: No Cauda equina syndrome: No Compression fracture: No Abdominal aneurysm: No  COGNITION: Overall cognitive status: Within functional limits for tasks assessed     SENSATION: Patient reports that when she is having pain, she has T/N in her R foot and knee as well.  MUSCLE LENGTH: Hamstrings: Right 64 deg; Left 78 deg Thomas test: WFL  POSTURE: rounded shoulders, increased lumbar lordosis, anterior pelvic tilt, and right pelvic obliquity  PALPATION: Tight and TTP on L lumbar paraspinals, L QL, L gluts  LUMBAR ROM: WNL, mildly painful in R lat flex, forward flexion   LOWER EXTREMITY ROM:   Generally WFL, mildly tight and sore with L hip movement   LOWER EXTREMITY MMT:  L LE 5/5  MMT Right eval Left eval  Hip flexion 3+   Hip extension 3+   Hip abduction 4-   Hip adduction    Hip internal rotation 4-   Hip external rotation 4-   Knee flexion 4-   Knee extension 4-   Ankle dorsiflexion    Ankle plantarflexion    Ankle inversion    Ankle eversion     (Blank rows = not tested)  LUMBAR SPECIAL TESTS:  Slump test: Negative SI tests (-)  FUNCTIONAL TESTS:  Owestry scale  15 FGA 21  GAIT: Distance walked: In clinic distance Assistive device utilized: None Level of assistance: Complete Independence Comments: Mildly guarded  TODAY'S TREATMENT:  DATE:   09/19/22 Nustep L 4 5 min Dyna disc 4 way 10 x Scap stab on disc with core actviation cued 3 way red tband 10 each Core Stab supine Ppt 10 x Bridge 10 x Ppt with hip abd red tband 10 x Ppt with hip flex red tband 10 x HEP  [id number] Feet on ball bridge, KTC and obl Isometrc abs with ball 10 x hold 3 sec     09/12/22  Education  PATIENT EDUCATION:  Education details:HEP Person educated: Patient Education method: Investment banker, operational Education comprehension: verbalized understanding, returned demo  HOME EXERCISE PROGRAM: [id number]  ASSESSMENT:  CLINICAL IMPRESSION: pt arrived 13 min late for appt. States LBP 6/10 with Rt thigh pain that comes and goes- none currently. Porgressed core stab including HEP established OBJECTIVE IMPAIRMENTS: decreased activity tolerance, decreased coordination, decreased endurance, difficulty walking, decreased ROM, decreased strength, increased fascial restrictions, increased muscle spasms, impaired flexibility, improper body mechanics, postural dysfunction, and pain.   ACTIVITY LIMITATIONS: carrying, lifting, bending, sitting, standing, squatting, sleeping, and locomotion level  PARTICIPATION LIMITATIONS: meal prep, cleaning, laundry, community activity, and occupation  PERSONAL FACTORS: Past/current experiences are also affecting patient's functional outcome.   REHAB POTENTIAL: Good  CLINICAL DECISION MAKING: Stable/uncomplicated  EVALUATION COMPLEXITY: Moderate   GOALS: Goals reviewed with patient? Yes  SHORT TERM GOALS: Target date: 09/27/22  I with basic HEP Baseline: Goal status: 09/19/22 MET   LONG TERM GOALS: Target date: 12/05/22  I with final HEP Baseline:  Goal status: INITIAL  2.  Decrease Owestry score to <21% Baseline: 30% Goal status: INITIAL  3.  Increase FGA to at least 24 Baseline: 21 Goal status: INITIAL  4.  Increase R hip strength to at least 4+/5 Baseline:  Goal status: INITIAL  5.  Patient will be able to perform her normal daily activities with reports of pain < 3/10 Baseline:  Goal status: INITIAL  6.  Patient will ambulate at least 500' on level/ unlevel surfaces, I, no increase in pain. Baseline:  Goal status:  INITIAL  PLAN:  PT FREQUENCY: 2x/week  PT DURATION: 12 weeks  PLANNED INTERVENTIONS: Therapeutic exercises, Therapeutic activity, Neuromuscular re-education, Balance training, Gait training, Patient/Family education, Self Care, Joint mobilization, Dry Needling, Spinal mobilization, and Manual therapy.  PLAN FOR NEXT SESSION: assess today session and add HEP **no traction, Ionto, Estim, HP/CP, Vaso**   Angie Huberta Tompkins PTA 09/19/2022, 2:18 PM [place] [facility] [place]. [place], [place], [address] Phone: [phone]   Fax:  [phone]

## 2022-09-20 ENCOUNTER — Ambulatory Visit: Payer: Medicaid Other | Admitting: Physical Therapy

## 2022-09-26 ENCOUNTER — Ambulatory Visit: Payer: Medicaid Other | Attending: Family Medicine | Admitting: Physical Therapy

## 2022-09-26 DIAGNOSIS — R278 Other lack of coordination: Secondary | ICD-10-CM | POA: Insufficient documentation

## 2022-09-26 DIAGNOSIS — R262 Difficulty in walking, not elsewhere classified: Secondary | ICD-10-CM | POA: Insufficient documentation

## 2022-09-26 DIAGNOSIS — M25551 Pain in right hip: Secondary | ICD-10-CM | POA: Insufficient documentation

## 2022-09-26 DIAGNOSIS — M5459 Other low back pain: Secondary | ICD-10-CM | POA: Insufficient documentation

## 2022-09-26 DIAGNOSIS — M6281 Muscle weakness (generalized): Secondary | ICD-10-CM | POA: Insufficient documentation

## 2022-09-27 ENCOUNTER — Ambulatory Visit: Payer: Medicaid Other | Admitting: Physical Therapy

## 2022-09-27 DIAGNOSIS — M25551 Pain in right hip: Secondary | ICD-10-CM | POA: Diagnosis not present

## 2022-09-27 DIAGNOSIS — M6281 Muscle weakness (generalized): Secondary | ICD-10-CM

## 2022-09-27 DIAGNOSIS — R262 Difficulty in walking, not elsewhere classified: Secondary | ICD-10-CM

## 2022-09-27 DIAGNOSIS — M5459 Other low back pain: Secondary | ICD-10-CM

## 2022-09-27 DIAGNOSIS — R278 Other lack of coordination: Secondary | ICD-10-CM | POA: Diagnosis not present

## 2022-09-27 NOTE — Therapy (Signed)
OUTPATIENT PHYSICAL THERAPY THORACOLUMBAR    Patient Name: Kimberly Ali MRN: [id number] DOB:03-Jul-1976, 47 y.o., female Today's Date: 09/27/2022  END OF SESSION:  PT End of Session - 09/27/22 1000     Visit Number 3    Date for PT Re-Evaluation 12/05/22    PT Start Time 1000    PT Stop Time 1040    PT Time Calculation (min) 40 min             Past Medical History:  Diagnosis Date   Allergy    hayfever   Anal skin tag    Eczema    Headache    History of hay fever    History of migraine    Hypertension    Moderate persistent asthma 12/20/2016   Seizures ([place])    11-25-19- one time occurence, asthma induced, none since   Urticaria    Past Surgical History:  Procedure Laterality Date   ABDOMINAL HYSTERECTOMY     Fibroids   CERVIX SURGERY     x2   DILATION AND CURETTAGE OF UTERUS     EXCISION OF SKIN TAG N/A 07/23/2017   Procedure: EXCISION OF SKIN TAG;  Surgeon: Clovis Riley, MD;  Location: [place];  Service: General;  Laterality: N/A;   HEMORRHOID SURGERY  2018   states laser surgery for one hemorrhoid   Patient Active Problem List   Diagnosis Date Noted   GAD (generalized anxiety disorder) 07/04/2021   OSA (obstructive sleep apnea) 07/17/2020   Healthcare maintenance 07/17/2020   Seasonal and perennial allergic rhinoconjunctivitis 03/20/2020   Severe persistent asthma without complication 14/43/1540   Adverse food reaction 02/17/2020   Other atopic dermatitis 02/17/2020   Cervical spondylosis 09/03/2019   Vestibular migraine 08/18/2019   Moderate persistent asthma 12/20/2016    PCP: Kimberly Pal, DO  REFERRING PROVIDER: Shelda Pal, DO  REFERRING DIAG:  M54.50,M79.604 (ICD-10-CM) - Low back pain radiating to right leg  M25.551 (ICD-10-CM) - Hip pain, right    Rationale for Evaluation and Treatment: Rehabilitation  THERAPY DIAG:  Difficulty in walking, not elsewhere classified  Muscle  weakness (generalized)  Other low back pain  ONSET DATE: 09/02/22  SUBJECTIVE:                                                                                                                                                                                           SUBJECTIVE STATEMENT: Have not seen pt in 2 weeks. .Pt states she has been doing HEP. States while at work pain 7/10 but no radiating pain.  No pain today PERTINENT HISTORY:  Patient  reports that she was told she has scoliosis in the past, with her hips uneven.  Per Dr visit Kimberly Ali is a 47 y.o.  female for evaluation and treatment of back pain.    Onset:  1 week ago.  No inj or change in activity.  Location: lower R, radiating into R thigh Character:  aching  Progression of issue:  is unchanged Associated symptoms: radiating down RLE Denies bowel/bladder incontinence, bruising, redness, swelling, falls or weakness Treatment: to date has been OTC NSAIDS and muscle relaxers.   Neurovascular symptoms: no  Musculoskeletal: low back.   Tenderness to palpation: yes over lateral lumbar region on R and also R hip flexor Deformity: no Ecchymosis: no Straight leg test: negative for +Stinchfield Neg FADDIR, DABER, log roll Poor hamstring flexibility b/l  PAIN:  Are you having pain? Yes: NPRS scale: 0/10 Pain location: Starts in R low back, goes into the back and front of her R leg, also hurting in the groin. Pain description: throbbing, aching Aggravating factors: Sitting, standing, lying down Relieving factors: none  PRECAUTIONS: None  WEIGHT BEARING RESTRICTIONS: No  FALLS:  Has patient fallen in last 6 months? 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 She walks and stretches as much as possible.  PLOF: Independent  PATIENT GOALS: Decrease  pain, be able to sleep, stretch out the tightness.  NEXT MD VISIT:   OBJECTIVE:   DIAGNOSTIC FINDINGS:  Hip and pelvis Xray Mild bilateral femoroacetabular osteoarthritis. No acute fracture.   SCREENING FOR RED FLAGS: Bowel or bladder incontinence: No Spinal tumors: No Cauda equina syndrome: No Compression fracture: No Abdominal aneurysm: No  COGNITION: Overall cognitive status: Within functional limits for tasks assessed     SENSATION: Patient reports that when she is having pain, she has T/N in her R foot and knee as well.  MUSCLE LENGTH: Hamstrings: Right 64 deg; Left 78 deg Thomas test: WFL  POSTURE: rounded shoulders, increased lumbar lordosis, anterior pelvic tilt, and right pelvic obliquity  PALPATION: Tight and TTP on L lumbar paraspinals, L QL, L gluts  LUMBAR ROM: WNL, mildly painful in R lat flex, forward flexion   LOWER EXTREMITY ROM:   Generally WFL, mildly tight and sore with L hip movement   LOWER EXTREMITY MMT:  L LE 5/5  MMT Right eval Left eval  Hip flexion 3+   Hip extension 3+   Hip abduction 4-   Hip adduction    Hip internal rotation 4-   Hip external rotation 4-   Knee flexion 4-   Knee extension 4-   Ankle dorsiflexion    Ankle plantarflexion    Ankle inversion    Ankle eversion     (Blank rows = not tested)  LUMBAR SPECIAL TESTS:  Slump test: Negative SI tests (-)  FUNCTIONAL TESTS:  Owestry scale  15 FGA 21  GAIT: Distance walked: In clinic distance Assistive device utilized: None Level of assistance: Complete Independence Comments: Mildly guarded  TODAY'S TREATMENT:  DATE:   09/26/22 Nustep L 4 67mn Performed and issued HEP red tband scap stab and hip 3 way [id number] Reviewed BM ,sitting posture and desk ergonics  09/19/22 Nustep L 4 5 min Dyna disc 4 way 10 x Scap stab on disc with core  actviation cued 3 way red tband 10 each Core Stab supine Ppt 10 x Bridge 10 x Ppt with hip abd red tband 10 x Ppt with hip flex red tband 10 x HEP [id number] Feet on ball bridge, KTC and obl Isometrc abs with ball 10 x hold 3 sec     09/12/22  Education  PATIENT EDUCATION:  Education details:HEP Person educated: Patient Education method: EInvestment banker, operationalEducation comprehension: verbalized understanding, returned demo  HOME EXERCISE PROGRAM: [email or web address]  ASSESSMENT:  CLINICAL IMPRESSION: pt arrived feeling better. Radiating pain 50% better and only with sitting a desk job so edcu on sitting posture and desk ergonomics. Educ on centralization of pain.progressed strengthening HEP. Assessed goals.  OBJECTIVE IMPAIRMENTS: decreased activity tolerance, decreased coordination, decreased endurance, difficulty walking, decreased ROM, decreased strength, increased fascial restrictions, increased muscle spasms, impaired flexibility, improper body mechanics, postural dysfunction, and pain.   ACTIVITY LIMITATIONS: carrying, lifting, bending, sitting, standing, squatting, sleeping, and locomotion level  PARTICIPATION LIMITATIONS: meal prep, cleaning, laundry, community activity, and occupation  PERSONAL FACTORS: Past/current experiences are also affecting patient's functional outcome.   REHAB POTENTIAL: Good  CLINICAL DECISION MAKING: Stable/uncomplicated  EVALUATION COMPLEXITY: Moderate   GOALS: Goals reviewed with patient? Yes  SHORT TERM GOALS: Target date: 09/27/22  I with basic HEP Baseline: Goal status: 09/19/22 MET   LONG TERM GOALS: Target date: 12/05/22  I with final HEP Baseline:  Goal status: INITIAL  2.  Decrease Owestry score to <21% Baseline: 30% Goal status: INITIAL  3.  Increase FGA to at least 24 Baseline: 21 Goal status: INITIAL  4.  Increase R hip strength to at least 4+/5 Baseline:  Goal status: on going 09/27/22  5.  Patient will be able to perform  her normal daily activities with reports of pain < 3/10 Baseline:  Goal status: on going 09/27/22  6.  Patient will ambulate at least 500' on level/ unlevel surfaces, I, no increase in pain. Baseline:  Goal status: on going 09/27/22  PLAN:  PT FREQUENCY: 2x/week  PT DURATION: 12 weeks  PLANNED INTERVENTIONS: Therapeutic exercises, Therapeutic activity, Neuromuscular re-education, Balance training, Gait training, Patient/Family education, Self Care, Joint mobilization, Dry Needling, Spinal mobilization, and Manual therapy.  PLAN FOR NEXT SESSION: assess radiating symptoms with changes in desk set up **no traction, Ionto, Estim, HP/CP, Vaso**   Angie Samariya Rockhold PTA 09/27/2022, 10:01 AM [place] [address]Phone: [phone]  Fax:  [address] [place] [address]Phone: [phone]  Fax:  [phone] Patient Details  Name: LLeasha GoldbergerMRN: [id number] of Birth: 1November 22, 1977Referring Provider:  WShelda Ali  Encounter Date: 09/27/2022   PLaqueta Carina PTA 09/27/2022, 10:01 AM  [place] [address]Phone: [phone]  Fax:  [phone]

## 2022-10-09 ENCOUNTER — Other Ambulatory Visit: Payer: Self-pay | Admitting: Family Medicine

## 2022-10-09 ENCOUNTER — Telehealth: Payer: Self-pay | Admitting: Family Medicine

## 2022-10-09 MED ORDER — EPINEPHRINE 0.3 MG/0.3ML IJ SOAJ
0.3000 mg | INTRAMUSCULAR | 2 refills | Status: DC | PRN
Start: 2022-10-09 — End: 2024-05-12

## 2022-10-09 NOTE — Telephone Encounter (Signed)
Prescription Request  10/09/2022  Is this a "Controlled Substance" medicine? No  LOV: 09/02/2022  What is the name of the medication or equipment? EPINEPHrine 0.3 mg/0.3 mL IJ SOAJ injection   Have you contacted your pharmacy to request a refill? Yes - out of refills  Which pharmacy would you like this sent to?  [id number] [address]Phone:[phone]:[phone]     Patient notified that their request is being sent to the clinical staff for review and that they should receive a response within 2 business days.   Please advise at Mobile [phone](mobile)

## 2022-10-09 NOTE — Telephone Encounter (Signed)
Not on her list

## 2022-10-10 ENCOUNTER — Ambulatory Visit: Payer: Medicaid Other | Admitting: Physical Therapy

## 2022-10-15 ENCOUNTER — Ambulatory Visit: Payer: Medicaid Other

## 2022-10-15 ENCOUNTER — Other Ambulatory Visit: Payer: Self-pay

## 2022-10-15 DIAGNOSIS — R262 Difficulty in walking, not elsewhere classified: Secondary | ICD-10-CM | POA: Diagnosis not present

## 2022-10-15 DIAGNOSIS — M5459 Other low back pain: Secondary | ICD-10-CM

## 2022-10-15 DIAGNOSIS — M6281 Muscle weakness (generalized): Secondary | ICD-10-CM | POA: Diagnosis not present

## 2022-10-15 DIAGNOSIS — R278 Other lack of coordination: Secondary | ICD-10-CM

## 2022-10-15 DIAGNOSIS — M25551 Pain in right hip: Secondary | ICD-10-CM

## 2022-10-15 NOTE — Therapy (Signed)
OUTPATIENT PHYSICAL THERAPY THORACOLUMBAR    Patient Name: Kimberly Ali MRN: [id number] DOB:Nov 02, 1975, 47 y.o., female Today's Date: 10/15/2022  END OF SESSION:  PT End of Session - 10/15/22 0853     Visit Number 4    Date for PT Re-Evaluation 12/05/22    PT Start Time 0845    PT Stop Time 0930    PT Time Calculation (min) 45 min    Activity Tolerance Patient tolerated treatment well    Behavior During Therapy [facility] for tasks assessed/performed              Past Medical History:  Diagnosis Date   Allergy    hayfever   Anal skin tag    Eczema    Headache    History of hay fever    History of migraine    Hypertension    Moderate persistent asthma 12/20/2016   Seizures ([place])    11-25-19- one time occurence, asthma induced, none since   Urticaria    Past Surgical History:  Procedure Laterality Date   ABDOMINAL HYSTERECTOMY     Fibroids   CERVIX SURGERY     x2   DILATION AND CURETTAGE OF UTERUS     EXCISION OF SKIN TAG N/A 07/23/2017   Procedure: EXCISION OF SKIN TAG;  Surgeon: Clovis Riley, MD;  Location: [place];  Service: General;  Laterality: N/A;   [place]  2018   states laser surgery for one hemorrhoid   Patient Active Problem List   Diagnosis Date Noted   GAD (generalized anxiety disorder) 07/04/2021   OSA (obstructive sleep apnea) 07/17/2020   Healthcare maintenance 07/17/2020   Seasonal and perennial allergic rhinoconjunctivitis 03/20/2020   Severe persistent asthma without complication [id number]   Adverse food reaction 02/17/2020   Other atopic dermatitis 02/17/2020   Cervical spondylosis 09/03/2019   Vestibular migraine 08/18/2019   Moderate persistent asthma 12/20/2016    PCP: Shelda Pal, DO  REFERRING PROVIDER: Shelda Pal, DO  REFERRING DIAG:  M54.50,M79.604 (ICD-10-CM) - Low back pain radiating to right leg  M25.551 (ICD-10-CM) - Hip pain, right    Rationale for  Evaluation and Treatment: Rehabilitation  THERAPY DIAG:  Difficulty in walking, not elsewhere classified  Muscle weakness (generalized)  Other low back pain  Other lack of coordination  Pain in right hip  ONSET DATE: 09/02/22  SUBJECTIVE:                                                                                                                                                                                           SUBJECTIVE STATEMENT: Has been  out of town to help her mother in [place].  Woke up this am with deep pain R post hip/ SI and R lower back.  Still endorses pain with prolonged sitting PERTINENT HISTORY:  Patient reports that she was told she has scoliosis in the past, with her hips uneven.  Per Dr visit Kimberly Ali is a 47 y.o.  female for evaluation and treatment of back pain.    Onset:  1 week ago.  No inj or change in activity.  Location: lower R, radiating into R thigh Character:  aching  Progression of issue:  is unchanged Associated symptoms: radiating down RLE Denies bowel/bladder incontinence, bruising, redness, swelling, falls or weakness Treatment: to date has been OTC NSAIDS and muscle relaxers.   Neurovascular symptoms: no  Musculoskeletal: low back.   Tenderness to palpation: yes over lateral lumbar region on R and also R hip flexor Deformity: no Ecchymosis: no Straight leg test: negative for +Stinchfield Neg FADDIR, DABER, log roll Poor hamstring flexibility b/l  PAIN:  Are you having pain? Yes: NPRS scale: 7/10 Pain location: Starts in R low back, goes into the back and front of her R leg Pain description: throbbing, aching Aggravating factors: Sitting, standing, lying down Relieving factors: none  PRECAUTIONS: None  WEIGHT BEARING RESTRICTIONS: No  FALLS:  Has patient fallen in last 6 months? 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 She walks and stretches as much as possible.  PLOF: Independent  PATIENT GOALS: Decrease pain, be able to sleep, stretch out the tightness.  NEXT MD VISIT:   OBJECTIVE:   DIAGNOSTIC FINDINGS:  Hip and pelvis Xray Mild bilateral femoroacetabular osteoarthritis. No acute fracture.   SCREENING FOR RED FLAGS: Bowel or bladder incontinence: No Spinal tumors: No Cauda equina syndrome: No Compression fracture: No Abdominal aneurysm: No  COGNITION: Overall cognitive status: Within functional limits for tasks assessed     SENSATION: Patient reports that when she is having pain, she has T/N in her R foot and knee as well.  MUSCLE LENGTH: Hamstrings: Right 64 deg; Left 78 deg Thomas test: WFL  POSTURE: rounded shoulders, increased lumbar lordosis, anterior pelvic tilt, and right pelvic obliquity  PALPATION: Tight and TTP on L lumbar paraspinals, L QL, L gluts  LUMBAR ROM: WNL, mildly painful in R lat flex, forward flexion   LOWER EXTREMITY ROM:   Generally WFL, mildly tight and sore with L hip movement   LOWER EXTREMITY MMT:  L LE 5/5  MMT Right eval Left eval  Hip flexion 3+   Hip extension 3+   Hip abduction 4-   Hip adduction    Hip internal rotation 4-   Hip external rotation 4-   Knee flexion 4-   Knee extension 4-   Ankle dorsiflexion    Ankle plantarflexion    Ankle inversion    Ankle eversion     (Blank rows = not tested)  LUMBAR SPECIAL TESTS:  Slump test: Negative SI tests (-)  FUNCTIONAL TESTS:  Owestry scale  15 FGA 21  GAIT: Distance walked: In clinic distance Assistive device utilized: None Level of assistance: Complete Independence Comments: Mildly guarded  TODAY'S TREATMENT:  DATE:  10/15/22 Reassessed standing lumbar ROM, reproduction of  pain with FB, L side bending motions at end range. Observed less movement R lumbar region with all motions. SI cluster testing:  + R thigh thrust, faber's, compression. Rx: Manual:  muscle energy for pelvic rotation, performed 3 x in this session R side lying for muscle energy technique to improve R lumbar flexion/rot/side bending motion, 2 sets of 5 reps each  Therex: supine isometric hook lying hip abd/ER 30 sec holds, 3 reps Supine isometric alternating hip and knee extension to improve pelvic alignment, 5 sec holds, 3 reps Standing L lumbar side bending and rotation combined with flexion, L foot on step and sliding hands down L lower leg, 3 reps. Slow movement to open/ stretch R lumbar region Seated isometric hip abd/Er with strap 30 sec holds Briefly reviewed sitting position, to perform standing repeated lumbar extensions and intermittent isometric hip abd/Er during the day for pain management  09/26/22 Nustep L 4 75mn Performed and issued HEP red tband scap stab and hip 3 way [id number] Reviewed BM ,sitting posture and desk ergonics  09/19/22 Nustep L 4 5 min Dyna disc 4 way 10 x Scap stab on disc with core actviation cued 3 way red tband 10 each Core Stab supine Ppt 10 x Bridge 10 x Ppt with hip abd red tband 10 x Ppt with hip flex red tband 10 x HEP [id number] Feet on ball bridge, KTC and obl Isometrc abs with ball 10 x hold 3 sec     09/12/22  Education  PATIENT EDUCATION:  Education details:HEP Person educated: Patient Education method: EInvestment banker, operationalEducation comprehension: verbalized understanding, returned demo  HOME EXERCISE PROGRAM:\ Access Code: [email or web address] URL: [email or web address] Date: 10/15/2022 Prepared by: Demaya Hardge  Exercises - Hooklying Isometric Hip Abduction with Belt  - 1 x daily - 7 x weekly - 1 sets - 3 reps - 30 hold - Seated Hip Abduction with Resistance  - 1 x daily - 7 x weekly - 1 sets - 3 reps - 30 hold - Supine Single  Knee to Chest Stretch  - 1 x daily - 7 x weekly - 1 sets - 5 reps - 3 hold [id number]  ASSESSMENT:  CLINICAL IMPRESSION: pt woke this am with deep pain R gluteals.  Assessed and reassessed pain provoking movements throughout visit,including R hip flexion, R SLR, and lumbar flexion.  R hip flexion in supine limited to 90 degrees initially, did improve throughout visit to 105.  May have some SI involvement.  Responded well to Lumbar muscle energy as well.  Improved pain from 7 at initial appt to less than 3.  Still should benefit from PT to address her deficits.    OBJECTIVE IMPAIRMENTS: decreased activity tolerance, decreased coordination, decreased endurance, difficulty walking, decreased ROM, decreased strength, increased fascial restrictions, increased muscle spasms, impaired flexibility, improper body mechanics, postural dysfunction, and pain.   ACTIVITY LIMITATIONS: carrying, lifting, bending, sitting, standing, squatting, sleeping, and locomotion level  PARTICIPATION LIMITATIONS: meal prep, cleaning, laundry, community activity, and occupation  PERSONAL FACTORS: Past/current experiences are also affecting patient's functional outcome.   REHAB POTENTIAL: Good  CLINICAL DECISION MAKING: Stable/uncomplicated  EVALUATION COMPLEXITY: Moderate   GOALS: Goals reviewed with patient? Yes  SHORT TERM GOALS: Target date: 09/27/22  I with basic HEP Baseline: Goal status: 09/19/22 MET   LONG TERM GOALS: Target date: 12/05/22  I with final HEP Baseline:  Goal status: INITIAL  2.  Decrease Owestry score to <21% Baseline: 30% Goal  status: INITIAL  3.  Increase FGA to at least 24 Baseline: 21 Goal status: INITIAL  4.  Increase R hip strength to at least 4+/5 Baseline:  Goal status: on going 09/27/22  5.  Patient will be able to perform her normal daily activities with reports of pain < 3/10 Baseline:  Goal status: on going 09/27/22  6.  Patient will ambulate at least 500' on level/  unlevel surfaces, I, no increase in pain. Baseline:  Goal status: on going 09/27/22  PLAN:  PT FREQUENCY: 2x/week  PT DURATION: 12 weeks  PLANNED INTERVENTIONS: Therapeutic exercises, Therapeutic activity, Neuromuscular re-education, Balance training, Gait training, Patient/Family education, Self Care, Joint mobilization, Dry Needling, Spinal mobilization, and Manual therapy.  PLAN FOR NEXT SESSION: assess radiating symptoms with changes in desk set up **no traction, Ionto, Estim, HP/CP, Vaso**   Angie Payseur PTA 10/15/2022, 12:58 PM [place] at [place]. [place], [place], [address] Phone: [phone]   Fax:  [place] at [place]. [place], [place], [address] Phone: [phone]   Fax:  [phone]  Patient Details  Name: Kimberly Ali MRN: [id number] Date of Birth: 1975/10/21 Referring Provider:  Shelda Pal*  Encounter Date: 10/15/2022   Tim Lair, PT 10/15/2022, 12:58 PM  [place] at [place]. [place], [place], [address] Phone: [phone]   Fax:  [phone]

## 2022-10-18 ENCOUNTER — Ambulatory Visit: Payer: Medicaid Other | Admitting: Physical Therapy

## 2022-10-22 ENCOUNTER — Ambulatory Visit: Payer: Medicaid Other | Admitting: Physical Therapy

## 2022-10-22 DIAGNOSIS — M5459 Other low back pain: Secondary | ICD-10-CM

## 2022-10-22 DIAGNOSIS — M6281 Muscle weakness (generalized): Secondary | ICD-10-CM | POA: Diagnosis not present

## 2022-10-22 DIAGNOSIS — M25551 Pain in right hip: Secondary | ICD-10-CM

## 2022-10-22 DIAGNOSIS — R262 Difficulty in walking, not elsewhere classified: Secondary | ICD-10-CM | POA: Diagnosis not present

## 2022-10-22 DIAGNOSIS — R278 Other lack of coordination: Secondary | ICD-10-CM | POA: Diagnosis not present

## 2022-10-22 NOTE — Therapy (Signed)
OUTPATIENT PHYSICAL THERAPY THORACOLUMBAR    Patient Name: Kimberly Ali MRN: [id number] DOB:Apr 29, 1976, 47 y.o., female Today's Date: 10/22/2022  END OF SESSION:  PT End of Session - 10/22/22 0808     Visit Number 5    Date for PT Re-Evaluation 12/05/22    PT Start Time 0805    PT Stop Time 0845    PT Time Calculation (min) 40 min              Past Medical History:  Diagnosis Date   Allergy    hayfever   Anal skin tag    Eczema    Headache    History of hay fever    History of migraine    Hypertension    Moderate persistent asthma 12/20/2016   Seizures ([place])    11-25-19- one time occurence, asthma induced, none since   Urticaria    Past Surgical History:  Procedure Laterality Date   ABDOMINAL HYSTERECTOMY     Fibroids   CERVIX SURGERY     x2   DILATION AND CURETTAGE OF UTERUS     EXCISION OF SKIN TAG N/A 07/23/2017   Procedure: EXCISION OF SKIN TAG;  Surgeon: Kimberly Riley, MD;  Location: [facility];  Service: General;  Laterality: N/A;   Gravity  2018   states laser surgery for one hemorrhoid   Patient Active Problem List   Diagnosis Date Noted   GAD (generalized anxiety disorder) 07/04/2021   OSA (obstructive sleep apnea) 07/17/2020   Healthcare maintenance 07/17/2020   Seasonal and perennial allergic rhinoconjunctivitis 03/20/2020   Severe persistent asthma without complication [id number]   Adverse food reaction 02/17/2020   Other atopic dermatitis 02/17/2020   Cervical spondylosis 09/03/2019   Vestibular migraine 08/18/2019   Moderate persistent asthma 12/20/2016    PCP: Kimberly Pal, DO  REFERRING PROVIDER: Shelda Pal, DO  REFERRING DIAG:  M54.50,M79.604 (ICD-10-CM) - Low back pain radiating to right leg  M25.551 (ICD-10-CM) - Hip pain, right    Rationale for Evaluation and Treatment: Rehabilitation  THERAPY DIAG:  Muscle weakness (generalized)  Other low back pain  Pain  in right hip  ONSET DATE: 09/02/22  SUBJECTIVE:                                                                                                                                                                                           SUBJECTIVE STATEMENT: much better than last session, not sure what happen that day. Overall 50% better, no radiating pain and no sleeping issues. Some days pain is more intense and not sure why. Went to  gym and bike increased leg pain. PERTINENT HISTORY:  Patient reports that she was told she has scoliosis in the past, with her hips uneven.  Per Dr visit Kimberly Ali is a 47 y.o.  female for evaluation and treatment of back pain.    Onset:  1 week ago.  No inj or change in activity.  Location: lower R, radiating into R thigh Character:  aching  Progression of issue:  is unchanged Associated symptoms: radiating down RLE Denies bowel/bladder incontinence, bruising, redness, swelling, falls or weakness Treatment: to date has been OTC NSAIDS and muscle relaxers.   Neurovascular symptoms: no  Musculoskeletal: low back.   Tenderness to palpation: yes over lateral lumbar region on R and also R hip flexor Deformity: no Ecchymosis: no Straight leg test: negative for +[place]inchfield Neg FADDIR, DABER, log roll Poor hamstring flexibility b/l  PAIN:  Are you having pain? Yes: NPRS scale: 7/10 Pain location: Starts in R low back, goes into the back and front of her R leg Pain description: throbbing, aching Aggravating factors: Sitting, standing, lying down Relieving factors: none  PRECAUTIONS: None  WEIGHT BEARING RESTRICTIONS: No  FALLS:  Has patient fallen in last 6 months? 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 She walks and stretches as much as possible.  PLOF:  Independent  PATIENT GOALS: Decrease pain, be able to sleep, stretch out the tightness.  NEXT MD VISIT:   OBJECTIVE:   DIAGNOSTIC FINDINGS:  Hip and pelvis Xray Mild bilateral femoroacetabular osteoarthritis. No acute fracture.   SCREENING FOR RED FLAGS: Bowel or bladder incontinence: No Spinal tumors: No Cauda equina syndrome: No Compression fracture: No Abdominal aneurysm: No  COGNITION: Overall cognitive status: Within functional limits for tasks assessed     SENSATION: Patient reports that when she is having pain, she has T/N in her R foot and knee as well.  MUSCLE LENGTH: Hamstrings: Right 64 deg; Left 78 deg Thomas test: WFL  POSTURE: rounded shoulders, increased lumbar lordosis, anterior pelvic tilt, and right pelvic obliquity  PALPATION: Tight and TTP on L lumbar paraspinals, L QL, L gluts  LUMBAR ROM: WNL, mildly painful in R lat flex, forward flexion   LOWER EXTREMITY ROM:   Generally WFL, mildly tight and sore with L hip movement   LOWER EXTREMITY MMT:  L LE 5/5  MMT Right eval Left eval RT 10/22/22  Hip flexion 3+  4-  Hip extension 3+  4+  Hip abduction 4-  4  Hip adduction   4+  Hip internal rotation 4-    Hip external rotation 4-    Knee flexion 4-    Knee extension 4-    Ankle dorsiflexion     Ankle plantarflexion     Ankle inversion     Ankle eversion      (Blank rows = not tested)  LUMBAR SPECIAL TESTS:  Slump test: Negative SI tests (-)  FUNCTIONAL TESTS:  Owestry scale  15 FGA 21  GAIT: Distance walked: In clinic distance Assistive device utilized: None Level of assistance: Complete Independence Comments: Mildly guarded  TODAY'S TREATMENT:  DATE:   10/22/22 Nustep L 5 4mn Cable Pulleys hip 4 way 10# 10x each leg ( RT leg noteably easier) [place] heel raise and toe raises 20 x each STS 10 x with  green tband maintaining abd, then 10 x on airex Seated hip flex 15 x BIL green tband Seated hip ER/IR 15 x RT green tband Leg press 3 sets 10 feet 3 way 40#, SL 20# 10 x Dead Lift 5# 2 sets 10 6 inch step up with opp leg ext 10 x 6 inch step up laterally with opp leg abd 10 x        10/15/22 Reassessed standing lumbar ROM, reproduction of pain with FB, L side bending motions at end range. Observed less movement R lumbar region with all motions. SI cluster testing:  + R thigh thrust, faber's, compression. Rx: Manual:  muscle energy for pelvic rotation, performed 3 x in this session R side lying for muscle energy technique to improve R lumbar flexion/rot/side bending motion, 2 sets of 5 reps each  Therex: supine isometric hook lying hip abd/ER 30 sec holds, 3 reps Supine isometric alternating hip and knee extension to improve pelvic alignment, 5 sec holds, 3 reps Standing L lumbar side bending and rotation combined with flexion, L foot on step and sliding hands down L lower leg, 3 reps. Slow movement to open/ stretch R lumbar region Seated isometric hip abd/Er with strap 30 sec holds Briefly reviewed sitting position, to perform standing repeated lumbar extensions and intermittent isometric hip abd/Er during the day for pain management  09/26/22 Nustep L 4 614m Performed and issued HEP red tband scap stab and hip 3 way [id number] Reviewed BM ,sitting posture and desk ergonics  09/19/22 Nustep L 4 5 min Dyna disc 4 way 10 x Scap stab on disc with core actviation cued 3 way red tband 10 each Core Stab supine Ppt 10 x Bridge 10 x Ppt with hip abd red tband 10 x Ppt with hip flex red tband 10 x HEP [id number] Feet on ball bridge, KTC and obl Isometrc abs with ball 10 x hold 3 sec     09/12/22  Education  PATIENT EDUCATION:  Education details:HEP Person educated: Patient Education method: ExInvestment banker, operationalducation comprehension: verbalized understanding, returned demo  HOME  EXERCISE PROGRAM:\ Access Code: [email or web address] URL: [email or web address] Date: 10/15/2022 Prepared by: Amy Speaks  Exercises - Hooklying Isometric Hip Abduction with Belt  - 1 x daily - 7 x weekly - 1 sets - 3 reps - 30 hold - Seated Hip Abduction with Resistance  - 1 x daily - 7 x weekly - 1 sets - 3 reps - 30 hold - Supine Single Knee to Chest Stretch  - 1 x daily - 7 x weekly - 1 sets - 5 reps - 3 hold [id number]  ASSESSMENT:  CLINICAL IMPRESSION: pt overall 50% better. Pain in RT LB/hip. Progressed strengthening, RT hip weakness documented and fatigue noted with ex.Assessed goals as documented  OBJECTIVE IMPAIRMENTS: decreased activity tolerance, decreased coordination, decreased endurance, difficulty walking, decreased ROM, decreased strength, increased fascial restrictions, increased muscle spasms, impaired flexibility, improper body mechanics, postural dysfunction, and pain.   ACTIVITY LIMITATIONS: carrying, lifting, bending, sitting, standing, squatting, sleeping, and locomotion level  PARTICIPATION LIMITATIONS: meal prep, cleaning, laundry, community activity, and occupation  PERSONAL FACTORS: Past/current experiences are also affecting patient's functional outcome.   REHAB POTENTIAL: Good  CLINICAL DECISION MAKING: Stable/uncomplicated  EVALUATION COMPLEXITY: Moderate   GOALS: Goals reviewed with patient? Yes  SHORT TERM GOALS: Target date: 09/27/22  I with basic HEP Baseline: Goal status: 09/19/22 MET   LONG TERM GOALS: Target date: 12/05/22  I with final HEP Baseline:  Goal status: 2/27/partially met  2.  Decrease Owestry score to <21% Baseline: 30% Goal status: INITIAL  3.  Increase FGA to at least 24 Baseline: 21 Goal status: INITIAL  4.  Increase R hip strength to at least 4+/5 Baseline:  Goal status: on going 09/27/22 partially met 10/22/22 fatigues quickly  5.  Patient will be able to perform her normal daily activities with reports of pain  < 3/10 Baseline:  Goal status: on going 09/27/22  10/22/22 partially met  6.  Patient will ambulate at least 500' on level/ unlevel surfaces, I, no increase in pain. Baseline:  Goal status: on going 09/27/22 partially met 10/22/22  PLAN:  PT FREQUENCY: 2x/week  PT DURATION: 12 weeks  PLANNED INTERVENTIONS: Therapeutic exercises, Therapeutic activity, Neuromuscular re-education, Balance training, Gait training, Patient/Family education, Self Care, Joint mobilization, Dry Needling, Spinal mobilization, and Manual therapy.  PLAN FOR NEXT SESSION: next visit is last [place] and MD f/u in March **no traction, Ionto, Estim, HP/CP, Vaso**   Angie Lyndzie Zentz PTA 10/22/2022, 8:08 AM [facility] at [address]. [place], [place], [address] Phone: [phone]   Fax:  [facility] at [place]. [place], [place], [address] Phone: [phone]   Fax:  [phone]  Patient Details  Name: Kimberly Ali MRN: [id number] Date of Birth: 1976/04/22 Referring Provider:  Shelda Ali*  Encounter Date: 10/22/2022   Laqueta Carina, PTA 10/22/2022, 8:08 AM  [facility] at [place]. [place], [place], [address] Phone: [phone]   Fax:  [facility] at [place]. [place], [place], [address] Phone: [phone]   Fax:  [phone]  Patient Details  Name: Kimberly Ali MRN: [id number] Date of Birth: 1976/04/29 Referring Provider:  Shelda Ali*  Encounter Date: 10/22/2022   Laqueta Carina, PTA 10/22/2022, 8:08 AM  [facility] at [place]. [place], [place], [address] Phone: [phone]   Fax:  [phone]

## 2022-11-13 ENCOUNTER — Encounter: Payer: Self-pay | Admitting: Family Medicine

## 2022-11-13 ENCOUNTER — Ambulatory Visit: Payer: Medicaid Other | Admitting: Family Medicine

## 2022-11-13 VITALS — BP 120/72 | HR 66 | Temp 98.4°F | Ht 65.0 in | Wt 169.4 lb

## 2022-11-13 DIAGNOSIS — M79604 Pain in right leg: Secondary | ICD-10-CM | POA: Diagnosis not present

## 2022-11-13 DIAGNOSIS — M533 Sacrococcygeal disorders, not elsewhere classified: Secondary | ICD-10-CM | POA: Diagnosis not present

## 2022-11-13 DIAGNOSIS — L2089 Other atopic dermatitis: Secondary | ICD-10-CM

## 2022-11-13 DIAGNOSIS — F411 Generalized anxiety disorder: Secondary | ICD-10-CM

## 2022-11-13 DIAGNOSIS — M545 Low back pain, unspecified: Secondary | ICD-10-CM | POA: Diagnosis not present

## 2022-11-13 DIAGNOSIS — G8929 Other chronic pain: Secondary | ICD-10-CM | POA: Diagnosis not present

## 2022-11-13 MED ORDER — TRIAMCINOLONE ACETONIDE 0.025 % EX OINT: 1.0000 | TOPICAL_OINTMENT | Freq: Two times a day (BID) | CUTANEOUS | 0 refills | Status: DC

## 2022-11-13 MED ORDER — CITALOPRAM HYDROBROMIDE 20 MG PO TABS: 20.0000 mg | ORAL_TABLET | Freq: Every day | ORAL | 2 refills | Status: DC
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
## 2022-11-13 NOTE — Patient Instructions (Addendum)
Heat (pad or rice pillow in microwave) over affected area, 10-15 minutes twice daily.   Ice/cold pack over area for 10-15 min twice daily.  We will place a new order for PT/4 more sessions.  If you do not hear anything about your referral in the next 1-2 weeks, call our office and ask for an update.  If things are rough in the tailbone region, we can do an injection.   Let us know if you need anything.

## 2022-11-27 ENCOUNTER — Ambulatory Visit: Payer: Medicaid Other | Attending: Family Medicine

## 2022-11-27 ENCOUNTER — Other Ambulatory Visit: Payer: Self-pay

## 2022-11-27 DIAGNOSIS — M6281 Muscle weakness (generalized): Secondary | ICD-10-CM

## 2022-11-27 DIAGNOSIS — R278 Other lack of coordination: Secondary | ICD-10-CM

## 2022-11-27 DIAGNOSIS — M545 Low back pain, unspecified: Secondary | ICD-10-CM | POA: Diagnosis not present

## 2022-11-27 DIAGNOSIS — M25551 Pain in right hip: Secondary | ICD-10-CM

## 2022-11-27 DIAGNOSIS — M5459 Other low back pain: Secondary | ICD-10-CM | POA: Diagnosis not present

## 2022-11-27 DIAGNOSIS — R262 Difficulty in walking, not elsewhere classified: Secondary | ICD-10-CM | POA: Diagnosis not present

## 2022-11-27 DIAGNOSIS — M79604 Pain in right leg: Secondary | ICD-10-CM | POA: Insufficient documentation

## 2022-11-27 DIAGNOSIS — R29898 Other symptoms and signs involving the musculoskeletal system: Secondary | ICD-10-CM | POA: Diagnosis not present

## 2022-11-27 NOTE — Therapy (Signed)
OUTPATIENT PHYSICAL THERAPY THORACOLUMBAR    Patient Name: Kimberly Ali MRN: [id number] DOB:Nov 23, 1975, 47 y.o., female Today's Date: 11/27/2022  END OF SESSION:  PT End of Session - 11/27/22 1234     Visit Number 6    Date for PT Re-Evaluation 01/08/23    PT Start Time 0940    PT Stop Time [id number]    PT Time Calculation (min) 35 min    Activity Tolerance Patient tolerated treatment well    Behavior During Therapy [facility] for tasks assessed/performed               Past Medical History:  Diagnosis Date   Allergy    hayfever   Anal skin tag    Eczema    Headache    History of hay fever    History of migraine    Hypertension    Moderate persistent asthma 12/20/2016   Seizures    11-25-19- one time occurence, asthma induced, none since   Urticaria    Past Surgical History:  Procedure Laterality Date   ABDOMINAL HYSTERECTOMY     Fibroids   CERVIX SURGERY     x2   DILATION AND CURETTAGE OF UTERUS     EXCISION OF SKIN TAG N/A 07/23/2017   Procedure: EXCISION OF SKIN TAG;  Surgeon: Clovis Riley, MD;  Location: [place];  Service: General;  Laterality: N/A;   [place]  2018   states laser surgery for one hemorrhoid   Patient Active Problem List   Diagnosis Date Noted   GAD (generalized anxiety disorder) 07/04/2021   OSA (obstructive sleep apnea) 07/17/2020   Healthcare maintenance 07/17/2020   Seasonal and perennial allergic rhinoconjunctivitis 03/20/2020   Severe persistent asthma without complication [id number]   Adverse food reaction 02/17/2020   Other atopic dermatitis 02/17/2020   Cervical spondylosis 09/03/2019   Vestibular migraine 08/18/2019   Moderate persistent asthma 12/20/2016    PCP: Shelda Pal, DO  REFERRING PROVIDER: Shelda Pal, DO  REFERRING DIAG:  M54.50,M79.604 (ICD-10-CM) - Low back pain radiating to right leg  M25.551 (ICD-10-CM) - Hip pain, right    Rationale for  Evaluation and Treatment: Rehabilitation  THERAPY DIAG:  Other low back pain - Plan: PT plan of care cert/re-cert  Pain in right hip - Plan: PT plan of care cert/re-cert  Difficulty in walking, not elsewhere classified - Plan: PT plan of care cert/re-cert  Other lack of coordination - Plan: PT plan of care cert/re-cert  Muscle weakness (generalized) - Plan: PT plan of care cert/re-cert  Weakness of right hip  ONSET DATE: 09/02/22  SUBJECTIVE:  SUBJECTIVE STATEMENT: achy R hip back and front, (patient with c sign r lateral hip) reports since coming to this clinic has resumed working out and noticing stiffness R hip so was referred back by MD PERTINENT HISTORY:  Patient reports that she was told she has scoliosis in the past, with her hips uneven.  Per Dr visit [name] is a 47 y.o.  female for evaluation and treatment of back pain.    Onset:  1 week ago.  No inj or change in activity.  Location: lower R, radiating into R thigh Character:  aching  Progression of issue:  is unchanged Associated symptoms: radiating down RLE Denies bowel/bladder incontinence, bruising, redness, swelling, falls or weakness Treatment: to date has been OTC NSAIDS and muscle relaxers.   Neurovascular symptoms: no  Musculoskeletal: low back.   Tenderness to palpation: yes over lateral lumbar region on R and also R hip flexor Deformity: no Ecchymosis: no Straight leg test: negative for +Stinchfield Neg FADDIR, DABER, log roll Poor hamstring flexibility b/l  PAIN:  Are you having pain? Yes: NPRS scale:  /10 Pain location: Starts in R low back, goes into the back and front of her R leg Pain description: throbbing, aching Aggravating factors: Sitting, standing, lying down Relieving factors:  none  PRECAUTIONS: None  WEIGHT BEARING RESTRICTIONS: No  FALLS:  Has patient fallen in last 6 months? 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 She walks and stretches as much as possible.  PLOF: Independent  PATIENT GOALS: Decrease pain, be able to sleep, stretch out the tightness.  NEXT MD VISIT:   OBJECTIVE:   DIAGNOSTIC FINDINGS:  Hip and pelvis Xray Mild bilateral femoroacetabular osteoarthritis. No acute fracture.   SCREENING FOR RED FLAGS: Bowel or bladder incontinence: No Spinal tumors: No Cauda equina syndrome: No Compression fracture: No Abdominal aneurysm: No  COGNITION: Overall cognitive status: Within functional limits for tasks assessed     SENSATION: Patient reports that when she is having pain, she has T/N in her R foot and knee as well.  MUSCLE LENGTH: Hamstrings: Right 64 deg; Left 78 deg 11/27/22:  hamstrings equal and symmetrical B Thomas test: WFL  POSTURE: rounded shoulders, increased lumbar lordosis, anterior pelvic tilt, and right pelvic obliquity  PALPATION: Tight and TTP on L lumbar paraspinals, L QL, L gluts 11/27/22: Non tender R quadratus,very tender  R post SI jt line, R TFL musculature, R iliacus, R glut med, min, pirformis  LUMBAR ROM: WNL, mildly painful in R lat flex, forward flexion 11/27/22:  lumbar ROM wnl   LOWER EXTREMITY ROM:   Generally WFL, mildly tight and sore with L hip movement R hip flexion 105, R hip IR 10 L hip ROM wnl   LOWER EXTREMITY MMT:  L LE 5/5  MMT Right eval Left eval RT 10/22/22 11/27/22  Hip flexion 3+  4- 3+  Hip extension 3+  4+ 4  Hip abduction 4-  4 5  Hip adduction   4+ 5  Hip internal rotation 4-     Hip external rotation 4-   4-  Knee flexion 4-     Knee extension 4-     Ankle dorsiflexion      Ankle plantarflexion      Ankle  inversion      Ankle eversion       (Blank rows =  not tested)  LUMBAR SPECIAL TESTS:  Slump test: Negative SI tests (-) 11/27/22:  SI testing:  march test + restriction R PSIS Thigh thrust + R  Hip FAIR test with pain R groin  FUNCTIONAL TESTS:  Owestry scale  15 FGA 21 LEFS 45/80 or 56%   GAIT: Distance walked: In clinic distance Assistive device utilized: None Level of assistance: Complete Independence Comments: Mildly guarded  TODAY'S TREATMENT:                                                                                                                              DATE:  11/27/22: Reassessed the patient per findings above Manual: mob with movement for R hip flexion, with R hip lat and inferior distraction, 3 sets 12 reps, AAROM by patient Muscle energy to align pelvis, alternating hip and knee extension with manual resistance 5 sec holds, 3 reps each side at full hip flexion in supine L sidelying for massage gun R lateral and post hip musculature  Supine isometric hip abd/Er in hooklying 10/22/22 Nustep L 5 33min or 56% Cable Pulleys hip 4 way 10# 10x each leg ( RT leg noteably easier) [place] heel raise and toe raises 20 x each STS 10 x with green tband maintaining abd, then 10 x on airex Seated hip flex 15 x BIL green tband Seated hip ER/IR 15 x RT green tband Leg press 3 sets 10 feet 3 way 40#, SL 20# 10 x Dead Lift 5# 2 sets 10 6 inch step up with opp leg ext 10 x 6 inch step up laterally with opp leg abd 10 x        10/15/22 Reassessed standing lumbar ROM, reproduction of pain with FB, L side bending motions at end range. Observed less movement R lumbar region with all motions. SI cluster testing:  + R thigh thrust, faber's, compression. Rx: Manual:  muscle energy for pelvic rotation, performed 3 x in this session R side lying for muscle energy technique to improve R lumbar flexion/rot/side bending motion, 2 sets of 5 reps each  Therex: supine  isometric hook lying hip abd/ER 30 sec holds, 3 reps Supine isometric alternating hip and knee extension to improve pelvic alignment, 5 sec holds, 3 reps Standing L lumbar side bending and rotation combined with flexion, L foot on step and sliding hands down L lower leg, 3 reps. Slow movement to open/ stretch R lumbar region Seated isometric hip abd/Er with strap 30 sec holds Briefly reviewed sitting position, to perform standing repeated lumbar extensions and intermittent isometric hip abd/Er during the day for pain management  09/26/22 Nustep L 4 7min Performed and issued HEP red tband scap stab and hip 3 way [id number] Reviewed BM ,sitting posture and desk ergonics  09/19/22 Nustep L 4 5 min Dyna disc 4 way 10 x Scap stab on disc with core actviation cued 3 way red tband 10 each Core Stab supine Ppt 10 x  Bridge 10 x Ppt with hip abd red tband 10 x Ppt with hip flex red tband 10 x HEP [id number] Feet on ball bridge, KTC and obl Isometrc abs with ball 10 x hold 3 sec     09/12/22  Education  PATIENT EDUCATION:  Education details:HEP Person educated: Patient Education method: Investment banker, operational Education comprehension: verbalized understanding, returned demo  HOME EXERCISE PROGRAM:\ Access Code: [email or web address] URL: [email or web address] Date: 10/15/2022 Prepared by: Demorio Seeley  Exercises - Hooklying Isometric Hip Abduction with Belt  - 1 x daily - 7 x weekly - 1 sets - 3 reps - 30 hold - Seated Hip Abduction with Resistance  - 1 x daily - 7 x weekly - 1 sets - 3 reps - 30 hold - Supine Single Knee to Chest Stretch  - 1 x daily - 7 x weekly - 1 sets - 5 reps - 3 hold [id number]  ASSESSMENT:  CLINICAL IMPRESSION: patient returned today with new orders to continue and reassess, after 5 week hold from PT.  Today her lumbar Sx are much improved, she has been able to resume working out at gym and more active.  Objectively demonstrates findings consistent with SI dysfunction on  R, as well as R hip weakness particularly for R hip ER and flexion, with pain with FAIR positioning. Utilized R hip jt mobilizations for treatment, also for assessment to determine whether R hip jt restriction contributing to pain. Needs stabilization for hip external rotators.  Offered dry needling for lateral and post hip musculature but patient declined.would recommend extension of POC for patient for another 6 weeks , with new goal for LEFS.   OBJECTIVE IMPAIRMENTS: decreased activity tolerance, decreased coordination, decreased endurance, difficulty walking, decreased ROM, decreased strength, increased fascial restrictions, increased muscle spasms, impaired flexibility, improper body mechanics, postural dysfunction, and pain.   ACTIVITY LIMITATIONS: carrying, lifting, bending, sitting, standing, squatting, sleeping, and locomotion level  PARTICIPATION LIMITATIONS: meal prep, cleaning, laundry, community activity, and occupation  PERSONAL FACTORS: Past/current experiences are also affecting patient's functional outcome.   REHAB POTENTIAL: Good  CLINICAL DECISION MAKING: Stable/uncomplicated  EVALUATION COMPLEXITY: Moderate   GOALS: Goals reviewed with patient? Yes  SHORT TERM GOALS: Target date: 12/11/22  I with basic HEP Baseline: updated today Goal status: progressing   LONG TERM GOALS: Target date: 01/08/23  I with final HEP Baseline:  Goal status: 2/27/partially met 11/27/22: progressing  2.  Decrease Owestry score to <21% Baseline: 30% Goal status: INITIAL  3.  Increase FGA to at least 24 Baseline: 21 Goal status: MET 11/27/22  4.  Increase R hip strength to at least 4+/5 Baseline:  Goal status: on going 09/27/22 partially met 10/22/22 fatigues quickly 11/27/22: R hip ER, 3+. R hip flexion 3+. R hip ext 4/5  5.  Patient will be able to perform her normal daily activities with reports of pain < 3/10 Baseline:  Goal status: on going 09/27/22  10/22/22 partially met 11/27/22:   partially met 1/10 most days at worse  6.  Patient will ambulate at least 500' on level/ unlevel surfaces, I, no increase in pain. Baseline:  Goal status: on going 09/27/22 partially met 10/22/22   7. New goal 11/27/22:  LEFS score improve from 45/80 to 60/80 PLAN:  PT FREQUENCY: 2x/week  PT DURATION: 6 weeks  PLANNED INTERVENTIONS: Therapeutic exercises, Therapeutic activity, Neuromuscular re-education, Balance training, Gait training, Patient/Family education, Self Care, Joint mobilization, Dry Needling, Spinal mobilization, and Manual therapy.  PLAN FOR NEXT SESSION: ? How did  she respond to jt mobs r hip, additional soft tissue /pain management R lat and post hip, reassess SI jt **no traction, Ionto, Estim, HP/CP, Vaso**   Joram Venson, PT, DPT 11/27/2022, 1:06 PM [facility] [place]. [place], [place], [address] Phone: [phone]   Fax:  [facility] [place]. [place], [place], [address] Phone: [phone]   Fax:  [phone]  Patient Details  Name: Yasmeen Egerer MRN: [id number] Date of Birth: [birth year] Referring Provider:  Shelda Pal*  Encounter Date: 11/27/2022   Tim Lair, PT 11/27/2022, 1:06 PM  [facility] [place]. [place], [place], [address] Phone: [phone]   Fax:  [facility] [place]. [place], [place], [address] Phone: [phone]   Fax:  [phone]  Patient Details  Name: Liana Deener MRN: [id number] Date of Birth: [birth year] Referring Provider:  Shelda Pal*  Encounter Date: 11/27/2022   Tim Lair, PT 11/27/2022, 1:06 PM  [facility] at [place]. [place], [place],  [address] Phone: [phone]   Fax:  [phone]

## 2022-12-04 ENCOUNTER — Ambulatory Visit: Payer: Medicaid Other | Admitting: Physical Therapy

## 2022-12-05 ENCOUNTER — Ambulatory Visit: Payer: Medicaid Other | Admitting: Physical Therapy

## 2022-12-11 ENCOUNTER — Encounter: Payer: Self-pay | Admitting: Physical Therapy

## 2022-12-11 ENCOUNTER — Ambulatory Visit: Payer: Medicaid Other | Admitting: Physical Therapy

## 2022-12-11 DIAGNOSIS — M5459 Other low back pain: Secondary | ICD-10-CM

## 2022-12-11 DIAGNOSIS — R262 Difficulty in walking, not elsewhere classified: Secondary | ICD-10-CM

## 2022-12-11 DIAGNOSIS — M545 Low back pain, unspecified: Secondary | ICD-10-CM | POA: Diagnosis not present

## 2022-12-11 DIAGNOSIS — M79604 Pain in right leg: Secondary | ICD-10-CM | POA: Diagnosis not present

## 2022-12-11 DIAGNOSIS — M6281 Muscle weakness (generalized): Secondary | ICD-10-CM | POA: Diagnosis not present

## 2022-12-11 DIAGNOSIS — M25551 Pain in right hip: Secondary | ICD-10-CM | POA: Diagnosis not present

## 2022-12-11 DIAGNOSIS — R29898 Other symptoms and signs involving the musculoskeletal system: Secondary | ICD-10-CM | POA: Diagnosis not present

## 2022-12-11 DIAGNOSIS — R278 Other lack of coordination: Secondary | ICD-10-CM | POA: Diagnosis not present

## 2022-12-11 NOTE — Therapy (Signed)
OUTPATIENT PHYSICAL THERAPY THORACOLUMBAR    Patient Name: Kimberly Ali MRN: [id number] DOB:12/30/1975, 47 y.o., female Today's Date: 12/11/2022  END OF SESSION:  PT End of Session - 12/11/22 1019     Visit Number 7    Date for PT Re-Evaluation 01/08/23    PT Start Time 1019    PT Stop Time 1100    PT Time Calculation (min) 41 min    Activity Tolerance Patient tolerated treatment well    Behavior During Therapy [facility] for tasks assessed/performed               Past Medical History:  Diagnosis Date   Allergy    hayfever   Anal skin tag    Eczema    Headache    History of hay fever    History of migraine    Hypertension    Moderate persistent asthma 12/20/2016   Seizures    11-25-19- one time occurence, asthma induced, none since   Urticaria    Past Surgical History:  Procedure Laterality Date   ABDOMINAL HYSTERECTOMY     Fibroids   CERVIX SURGERY     x2   DILATION AND CURETTAGE OF UTERUS     EXCISION OF SKIN TAG N/A 07/23/2017   Procedure: EXCISION OF SKIN TAG;  Surgeon: Berna Bue, MD;  Location: [facility];  Service: General;  Laterality: N/A;   HEMORRHOID SURGERY  2018   states laser surgery for one hemorrhoid   Patient Active Problem List   Diagnosis Date Noted   GAD (generalized anxiety disorder) 07/04/2021   OSA (obstructive sleep apnea) 07/17/2020   Healthcare maintenance 07/17/2020   Seasonal and perennial allergic rhinoconjunctivitis 03/20/2020   Severe persistent asthma without complication 02/17/2020   Adverse food reaction 02/17/2020   Other atopic dermatitis 02/17/2020   Cervical spondylosis 09/03/2019   Vestibular migraine 08/18/2019   Moderate persistent asthma 12/20/2016    PCP: Sharlene Dory, DO  REFERRING PROVIDER: Sharlene Dory, DO  REFERRING DIAG:  M54.50,M79.604 (ICD-10-CM) - Low back pain radiating to right leg  M25.551 (ICD-10-CM) - Hip pain, right    Rationale for  Evaluation and Treatment: Rehabilitation  THERAPY DIAG:  Other low back pain  Pain in right hip  Difficulty in walking, not elsewhere classified  ONSET DATE: 09/02/22  SUBJECTIVE:                                                                                                                                                                                           SUBJECTIVE STATEMENT: Doing good, no aches and pains. Able to sleep  PERTINENT HISTORY:  Patient reports that she was told she has scoliosis in the past, with her hips uneven.  Per Dr visit Stachia Slutsky is a 47 y.o.  female for evaluation and treatment of back pain.    Onset:  1 week ago.  No inj or change in activity.  Location: lower R, radiating into R thigh Character:  aching  Progression of issue:  is unchanged Associated symptoms: radiating down RLE Denies bowel/bladder incontinence, bruising, redness, swelling, falls or weakness Treatment: to date has been OTC NSAIDS and muscle relaxers.   Neurovascular symptoms: no  Musculoskeletal: low back.   Tenderness to palpation: yes over lateral lumbar region on R and also R hip flexor Deformity: no Ecchymosis: no Straight leg test: negative for +Stinchfield Neg FADDIR, DABER, log roll Poor hamstring flexibility b/l  PAIN:  Are you having pain? Yes: NPRS scale: 0/10 Pain location: Starts in R low back, goes into the back and front of her R leg Pain description: throbbing, aching Aggravating factors: Sitting, standing, lying down Relieving factors: none  PRECAUTIONS: None  WEIGHT BEARING RESTRICTIONS: No  FALLS:  Has patient fallen in last 6 months? 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 She walks and stretches as much as possible.  PLOF: Independent  PATIENT GOALS: Decrease pain,  be able to sleep, stretch out the tightness.  NEXT MD VISIT:   OBJECTIVE:   DIAGNOSTIC FINDINGS:  Hip and pelvis Xray Mild bilateral femoroacetabular osteoarthritis. No acute fracture.   SCREENING FOR RED FLAGS: Bowel or bladder incontinence: No Spinal tumors: No Cauda equina syndrome: No Compression fracture: No Abdominal aneurysm: No  COGNITION: Overall cognitive status: Within functional limits for tasks assessed     SENSATION: Patient reports that when she is having pain, she has T/N in her R foot and knee as well.  MUSCLE LENGTH: Hamstrings: Right 64 deg; Left 78 deg 11/27/22:  hamstrings equal and symmetrical B Thomas test: WFL  POSTURE: rounded shoulders, increased lumbar lordosis, anterior pelvic tilt, and right pelvic obliquity  PALPATION: Tight and TTP on L lumbar paraspinals, L QL, L gluts 11/27/22: Non tender R quadratus,very tender  R post SI jt line, R TFL musculature, R iliacus, R glut med, min, pirformis  LUMBAR ROM: WNL, mildly painful in R lat flex, forward flexion 11/27/22:  lumbar ROM wnl   LOWER EXTREMITY ROM:   Generally WFL, mildly tight and sore with L hip movement R hip flexion 105, R hip IR 10 L hip ROM wnl   LOWER EXTREMITY MMT:  L LE 5/5  MMT Right eval Left eval RT 10/22/22 11/27/22  Hip flexion 3+  4- 3+  Hip extension 3+  4+ 4  Hip abduction 4-  4 5  Hip adduction   4+ 5  Hip internal rotation 4-     Hip external rotation 4-   4-  Knee flexion 4-     Knee extension 4-     Ankle dorsiflexion      Ankle plantarflexion      Ankle inversion      Ankle eversion       (Blank rows = not tested)  LUMBAR SPECIAL TESTS:  Slump test: Negative SI tests (-) 11/27/22:  SI testing:  march test + restriction R PSIS Thigh thrust + R  Hip FAIR test with pain R groin  FUNCTIONAL TESTS:  Owestry scale  15 FGA 21 LEFS 45/80 or 56%   GAIT: Distance walked: In clinic distance Assistive device utilized: None Level of assistance: Complete  Independence Comments: Mildly guarded  TODAY'S TREATMENT:                                                                                                                              DATE:  NuStep L 5 x 6 min Shoulder Ext 5lb 2x10 Standing Rows 5lb 2x10 Leg press 30lb 2x10 Hamstring curls 20lb 2x10 Leg Ext 5lb 2x10 S2S OHP w/ yellow ball 2x10 Bridges x10 LE on Pball bridges, oblq, K2C     11/27/22: Reassessed the patient per findings above Manual: mob with movement for R hip flexion, with R hip lat and inferior distraction, 3 sets 12 reps, AAROM by patient Muscle energy to align pelvis, alternating hip and knee extension with manual resistance 5 sec holds, 3 reps each side at full hip flexion in supine L sidelying for massage gun R lateral and post hip musculature  Supine isometric hip abd/Er in hooklying 10/22/22 Nustep L 5 or 56% Cable Pulleys hip 4 way 10# 10x each leg ( RT leg noteably easier) [place] heel raise and toe raises 20 x each STS 10 x with green tband maintaining abd, then 10 x on airex Seated hip flex 15 x BIL green tband Seated hip ER/IR 15 x RT green tband Leg press 3 sets 10 feet 3 way 40#, SL 20# 10 x Dead Lift 5# 2 sets 10 6 inch step up with opp leg ext 10 x 6 inch step up laterally with opp leg abd 10 x   10/15/22 Reassessed standing lumbar ROM, reproduction of pain with FB, L side bending motions at end range. Observed less movement R lumbar region with all motions. SI cluster testing:  + R thigh thrust, faber's, compression. Rx: Manual:  muscle energy for pelvic rotation, performed 3 x in this session R side lying for muscle energy technique to improve R lumbar flexion/rot/side bending motion, 2 sets of 5 reps each  Therex: supine isometric hook lying hip abd/ER 30 sec holds, 3 reps Supine isometric alternating hip and knee extension to improve pelvic alignment, 5 sec holds, 3 reps Standing L lumbar side bending and rotation combined with  flexion, L foot on step and sliding hands down L lower leg, 3 reps. Slow movement to open/ stretch R lumbar region Seated isometric hip abd/Er with strap 30 sec holds Briefly reviewed sitting position, to perform standing repeated lumbar extensions and intermittent isometric hip abd/Er during the day for pain management  09/26/22 Nustep L 4 Performed and issued HEP red tband scap stab and hip 3 way [id number] Reviewed BM ,sitting posture and desk ergonics  09/19/22 Nustep L 4 5 min Dyna disc 4 way 10 x Scap stab on disc with core actviation cued 3 way red tband 10 each Core Stab supine  Ppt 10 x Bridge 10 x Ppt with hip abd red tband 10 x Ppt with hip flex red tband 10 x HEP [id number] Feet on ball bridge, KTC and obl Isometrc abs with ball 10 x hold 3 sec     09/12/22  Education  PATIENT EDUCATION:  Education details:HEP Person educated: Patient Education method: IT trainer Education comprehension: verbalized understanding, returned demo  HOME EXERCISE PROGRAM:\ Access Code: [email or web address] URL: [email or web address] Date: 10/15/2022 Prepared by: Amy Speaks  Exercises - Hooklying Isometric Hip Abduction with Belt  - 1 x daily - 7 x weekly - 1 sets - 3 reps - 30 hold - Seated Hip Abduction with Resistance  - 1 x daily - 7 x weekly - 1 sets - 3 reps - 30 hold - Supine Single Knee to Chest Stretch  - 1 x daily - 7 x weekly - 1 sets - 5 reps - 3 hold [id number]  ASSESSMENT:  CLINICAL IMPRESSION: Pt enters doing well with no pain. Session consisted of postural and core strength. Postural cues needed with standing shoulder Ext. Cues needed for core engagement with supine bridges. Pt expressed some difficulty with bridges while LE on Pball. No reports of pain post session.   OBJECTIVE IMPAIRMENTS: decreased activity tolerance, decreased coordination, decreased endurance, difficulty walking, decreased ROM, decreased strength, increased fascial restrictions,  increased muscle spasms, impaired flexibility, improper body mechanics, postural dysfunction, and pain.   ACTIVITY LIMITATIONS: carrying, lifting, bending, sitting, standing, squatting, sleeping, and locomotion level  PARTICIPATION LIMITATIONS: meal prep, cleaning, laundry, community activity, and occupation  PERSONAL FACTORS: Past/current experiences are also affecting patient's functional outcome.   REHAB POTENTIAL: Good  CLINICAL DECISION MAKING: Stable/uncomplicated  EVALUATION COMPLEXITY: Moderate   GOALS: Goals reviewed with patient? Yes  SHORT TERM GOALS: Target date: 12/11/22  I with basic HEP Baseline: updated today Goal status: Met 12/11/22   LONG TERM GOALS: Target date: 01/08/23  I with final HEP Baseline:  Goal status: 2/27/partially met 11/27/22: progressing  2.  Decrease Owestry score to <21% Baseline: 30% Goal status: INITIAL  3.  Increase FGA to at least 24 Baseline: 21 Goal status: MET 11/27/22  4.  Increase R hip strength to at least 4+/5 Baseline:  Goal status: on going 09/27/22 partially met 10/22/22 fatigues quickly 11/27/22: R hip ER, 3+. R hip flexion 3+. R hip ext 4/5  5.  Patient will be able to perform her normal daily activities with reports of pain < 3/10 Baseline:  Goal status: Met 12/11/22  6.  Patient will ambulate at least 500' on level/ unlevel surfaces, I, no increase in pain. Baseline:  Goal status: on going 09/27/22 partially met 10/22/22   7. New goal 11/27/22:  LEFS score improve from 45/80 to 60/80 PLAN:  PT FREQUENCY: 2x/week  PT DURATION: 6 weeks  PLANNED INTERVENTIONS: Therapeutic exercises, Therapeutic activity, Neuromuscular re-education, Balance training, Gait training, Patient/Family education, Self Care, Joint mobilization, Dry Needling, Spinal mobilization, and Manual therapy.  PLAN FOR NEXT SESSION: ? How did she respond to jt mobs r hip, additional soft tissue /pain management R lat and post hip, reassess SI jt **no  traction, Ionto, Estim, HP/CP, Vaso**   Amy Speaks, PT, DPT 12/11/2022, 10:20 AM [facility] at [facility] [address] [facility]. [place], [place], [address] Phone: [phone]   Fax:  [phone] at [facility] [address] [place], [place], [address] Phone: [phone]   Fax:  [phone]  Patient Details  Name: Kimberly Ali MRN: [id number] Date of Birth: 1975-12-10 Referring Provider:  Sharlene Dory*  Encounter Date: 12/11/2022   Grayce Sessions, PTA 12/11/2022, 10:20 AM  [place] [facility] at [facility] [address] [facility]. [place], [place], [address] Phone: [phone]   Fax:  [phone] at [facility] [address] [place], [place], [address] Phone: [phone]   Fax:  [phone]  Patient Details  Name: Kimberly Ali MRN: [id number] Date of Birth: 09/07/1975 Referring Provider:  Sharlene Dory*  Encounter Date: 12/11/2022   Grayce Sessions, PTA 12/11/2022, 10:20 AM  [place] [facility] at [facility] [address] [facility] [place], [place], [address] Phone: [phone]   Fax:  [phone]

## 2022-12-18 ENCOUNTER — Encounter: Payer: Self-pay | Admitting: Obstetrics & Gynecology

## 2022-12-18 ENCOUNTER — Ambulatory Visit: Payer: Medicaid Other

## 2022-12-18 ENCOUNTER — Other Ambulatory Visit (HOSPITAL_COMMUNITY)
Admission: RE | Admit: 2022-12-18 | Discharge: 2022-12-18 | Disposition: A | Discharge status: Home / Self Care | Payer: Medicaid Other | Source: Ambulatory Visit | Attending: Obstetrics & Gynecology | Admitting: Obstetrics & Gynecology

## 2022-12-18 ENCOUNTER — Other Ambulatory Visit: Payer: Self-pay | Admitting: Family Medicine

## 2022-12-18 ENCOUNTER — Other Ambulatory Visit: Payer: Self-pay

## 2022-12-18 ENCOUNTER — Ambulatory Visit (INDEPENDENT_AMBULATORY_CARE_PROVIDER_SITE_OTHER): Payer: Medicaid Other | Admitting: Obstetrics & Gynecology

## 2022-12-18 VITALS — BP 166/90 | HR 94

## 2022-12-18 DIAGNOSIS — N898 Other specified noninflammatory disorders of vagina: Secondary | ICD-10-CM

## 2022-12-18 DIAGNOSIS — R278 Other lack of coordination: Secondary | ICD-10-CM | POA: Diagnosis not present

## 2022-12-18 DIAGNOSIS — M25551 Pain in right hip: Secondary | ICD-10-CM

## 2022-12-18 DIAGNOSIS — R29898 Other symptoms and signs involving the musculoskeletal system: Secondary | ICD-10-CM

## 2022-12-18 DIAGNOSIS — Z01419 Encounter for gynecological examination (general) (routine) without abnormal findings: Secondary | ICD-10-CM | POA: Diagnosis not present

## 2022-12-18 DIAGNOSIS — M6281 Muscle weakness (generalized): Secondary | ICD-10-CM | POA: Diagnosis not present

## 2022-12-18 DIAGNOSIS — M79604 Pain in right leg: Secondary | ICD-10-CM | POA: Diagnosis not present

## 2022-12-18 DIAGNOSIS — M5459 Other low back pain: Secondary | ICD-10-CM

## 2022-12-18 DIAGNOSIS — R262 Difficulty in walking, not elsewhere classified: Secondary | ICD-10-CM | POA: Diagnosis not present

## 2022-12-18 DIAGNOSIS — M545 Low back pain, unspecified: Secondary | ICD-10-CM | POA: Diagnosis not present

## 2022-12-18 NOTE — Progress Notes (Signed)
Subjective:     Kimberly Ali is a 47 y.o. female here for a routine exam.  Current complaints:  No GYN complaints. Parnter still wants to consider donor eggs. Pt not interested in contraception. Pt reports not feeling as 'fresh ' over the last few days. Concerned that her pH is off.    Gynecologic History No LMP recorded. Patient has had a hysterectomy. Contraception: status post hysterectomy Last Pap: s/p hysterectomy  Last mammogram: 09/13/2022. Results were: birad 2 -favor benign  Obstetric History OB History  Gravida Para Term Preterm AB Living  6 4 1 3 2 4   SAB IAB Ectopic Multiple Live Births  2 0 0 1 4    # Outcome Date GA Lbr Len/2nd Weight Sex Delivery Anes PTL Lv  6 Preterm 2010 [redacted]w[redacted]d   F Vag-Spont None Y LIV  5 SAB 2009          4 Preterm 2006 [redacted]w[redacted]d   M Vag-Spont EPI Y LIV  3 Term 2002 [redacted]w[redacted]d   F  None N LIV  2 SAB 2000          1 Preterm 1997 [redacted]w[redacted]d   F  None Y LIV    The following portions of the patient's history were reviewed and updated as appropriate: allergies, current medications, past family history, past medical history, past social history, past surgical history, and problem list.  Review of Systems Pertinent items are noted in HPI.    Objective:  BP (!) 166/90   Pulse 94   General Appearance:    Alert, cooperative, no distress, appears stated age  Head:    Normocephalic, without obvious abnormality, atraumatic  Eyes:    conjunctiva/corneas clear, EOM's intact, both eyes  Ears:    Normal external ear canals, both ears  Nose:   Nares normal, septum midline, mucosa normal, no drainage    or sinus tenderness  Throat:   Lips, mucosa, and tongue normal; teeth and gums normal  Neck:   Supple, symmetrical, trachea midline, no adenopathy;    thyroid:  no enlargement/tenderness/nodules  Back:     Symmetric, no curvature, ROM normal, no CVA tenderness  Lungs:     respirations unlabored  Chest Wall:    No tenderness or deformity   Heart:    Regular rate  and rhythm  Breast Exam:    No tenderness, masses, or nipple abnormality  Abdomen:     Soft, non-tender, bowel sounds active all four quadrants,    no masses, no organomegaly  Genitalia:    Normal female without lesion, discharge or tenderness     Extremities:   Extremities normal, atraumatic, no cyanosis or edema  Pulses:   2+ and symmetric all extremities  Skin:   Skin color, texture, turgor normal, no rashes or lesions     Assessment:    Healthy female exam.  UTD with age appropriate screening tests   Plan:   Kimberly Ali was seen today for gynecologic exam and annual exam.  Diagnoses and all orders for this visit:  Vaginal discharge -     Cervicovaginal ancillary only( [place])   F/u in 1 year or sooner prn   Kimberly Ali, M.D., [place]

## 2022-12-18 NOTE — Progress Notes (Signed)
Last mammogram Jan 2024 Hx of Hysterectomy.  Armandina Stammer RN

## 2022-12-18 NOTE — Therapy (Signed)
OUTPATIENT PHYSICAL THERAPY THORACOLUMBAR    Patient Name: Kimberly Ali MRN: [id number] DOB:02-09-1976, 47 y.o., female Today's Date: 12/18/2022  END OF SESSION:  PT End of Session - 12/18/22 1053     Visit Number 8    Date for PT Re-Evaluation 01/08/23    PT Start Time 1019    PT Stop Time 1100    PT Time Calculation (min) 41 min    Activity Tolerance Patient tolerated treatment well    Behavior During Therapy [facility] for tasks assessed/performed                Past Medical History:  Diagnosis Date   Allergy    hayfever   Anal skin tag    Eczema    Headache    History of hay fever    History of migraine    Hypertension    Moderate persistent asthma 12/20/2016   Seizures    11-25-19- one time occurence, asthma induced, none since   Urticaria    Past Surgical History:  Procedure Laterality Date   ABDOMINAL HYSTERECTOMY     Fibroids   CERVIX SURGERY     x2   DILATION AND CURETTAGE OF UTERUS     EXCISION OF SKIN TAG N/A 07/23/2017   Procedure: EXCISION OF SKIN TAG;  Surgeon: Berna Bue, MD;  Location: [facility];  Service: General;  Laterality: N/A;   HEMORRHOID SURGERY  2018   states laser surgery for one hemorrhoid   Patient Active Problem List   Diagnosis Date Noted   GAD (generalized anxiety disorder) 07/04/2021   OSA (obstructive sleep apnea) 07/17/2020   Healthcare maintenance 07/17/2020   Seasonal and perennial allergic rhinoconjunctivitis 03/20/2020   Severe persistent asthma without complication 02/17/2020   Adverse food reaction 02/17/2020   Other atopic dermatitis 02/17/2020   Cervical spondylosis 09/03/2019   Vestibular migraine 08/18/2019   Moderate persistent asthma 12/20/2016    PCP: Sharlene Dory, DO  REFERRING PROVIDER: Sharlene Dory, DO  REFERRING DIAG:  M54.50,M79.604 (ICD-10-CM) - Low back pain radiating to right leg  M25.551 (ICD-10-CM) - Hip pain, right    Rationale for  Evaluation and Treatment: Rehabilitation  THERAPY DIAG:  Other low back pain  Pain in right hip  Difficulty in walking, not elsewhere classified  Other lack of coordination  Muscle weakness (generalized)  Weakness of right hip  ONSET DATE: 09/02/22  SUBJECTIVE:  SUBJECTIVE STATEMENT: Doing good, no pain today,    PERTINENT HISTORY:  Patient reports that she was told she has scoliosis in the past, with her hips uneven.  Per Dr visit Henya Aguallo is a 47 y.o.  female for evaluation and treatment of back pain.    Onset:  1 week ago.  No inj or change in activity.  Location: lower R, radiating into R thigh Character:  aching  Progression of issue:  is unchanged Associated symptoms: radiating down RLE Denies bowel/bladder incontinence, bruising, redness, swelling, falls or weakness Treatment: to date has been OTC NSAIDS and muscle relaxers.   Neurovascular symptoms: no  Musculoskeletal: low back.   Tenderness to palpation: yes over lateral lumbar region on R and also R hip flexor Deformity: no Ecchymosis: no Straight leg test: negative for +Stinchfield Neg FADDIR, DABER, log roll Poor hamstring flexibility b/l  PAIN:  Are you having pain? Yes: NPRS scale: 0/10 Pain location: Starts in R low back, goes into the back and front of her R leg Pain description: throbbing, aching Aggravating factors: Sitting, standing, lying down Relieving factors: none  PRECAUTIONS: None  WEIGHT BEARING RESTRICTIONS: No  FALLS:  Has patient fallen in last 6 months? 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 She walks and stretches as  much as possible.  PLOF: Independent  PATIENT GOALS: Decrease pain, be able to sleep, stretch out the tightness.  NEXT MD VISIT:   OBJECTIVE:   DIAGNOSTIC FINDINGS:  Hip and pelvis Xray Mild bilateral femoroacetabular osteoarthritis. No acute fracture.   SCREENING FOR RED FLAGS: Bowel or bladder incontinence: No Spinal tumors: No Cauda equina syndrome: No Compression fracture: No Abdominal aneurysm: No  COGNITION: Overall cognitive status: Within functional limits for tasks assessed     SENSATION: Patient reports that when she is having pain, she has T/N in her R foot and knee as well.  MUSCLE LENGTH: Hamstrings: Right 64 deg; Left 78 deg 11/27/22:  hamstrings equal and symmetrical B Thomas test: WFL  POSTURE: rounded shoulders, increased lumbar lordosis, anterior pelvic tilt, and right pelvic obliquity  PALPATION: Tight and TTP on L lumbar paraspinals, L QL, L gluts 11/27/22: Non tender R quadratus,very tender  R post SI jt line, R TFL musculature, R iliacus, R glut med, min, pirformis  LUMBAR ROM: WNL, mildly painful in R lat flex, forward flexion 11/27/22:  lumbar ROM wnl   LOWER EXTREMITY ROM:   Generally WFL, mildly tight and sore with L hip movement R hip flexion 105, R hip IR 10 L hip ROM wnl   LOWER EXTREMITY MMT:  L LE 5/5  MMT Right eval Left eval RT 10/22/22 11/27/22  Hip flexion 3+  4- 3+  Hip extension 3+  4+ 4  Hip abduction 4-  4 5  Hip adduction   4+ 5  Hip internal rotation 4-     Hip external rotation 4-   4-  Knee flexion 4-     Knee extension 4-     Ankle dorsiflexion      Ankle plantarflexion      Ankle inversion      Ankle eversion       (Blank rows = not tested)  LUMBAR SPECIAL TESTS:  Slump test: Negative SI tests (-) 11/27/22:  SI testing:  march test + restriction R PSIS Thigh thrust +  R  Hip FAIR test with pain R groin  FUNCTIONAL TESTS:  Owestry scale  15 FGA 21 LEFS 45/80 or 56%   GAIT: Distance walked: In clinic  distance Assistive device utilized: None Level of assistance: Complete Independence Comments: Mildly guarded  TODAY'S TREATMENT:                                                                                                                              DATE: 12/18/22:  TM , 30 sec  B shoulder ext pulleys, 5 #  B shoulder rows pulley 5# Leg press 30#, 2 x 10 Supine B heels to chest , feet on 65cm ball,while performing/maintaining pelvic tilt 2 sets 10 reps Supine pelvic tilt combined with therapeutic ball LTR's 10 x Supine pelvic tilt , thighs on therapeutic ball, alt hip flexion 10x Supine bridge thighs on therapeutic ball 10x 2 Seated B leg extension 5#, 2 x 10 Hamstring curls 20# 2 x 10 Nustep level 5 B Ue's and LE's 5 min    12/11/22:NuStep L 5 x 6 min Shoulder Ext 5lb 2x10 Standing Rows 5lb 2x10 Leg press 30lb 2x10 Hamstring curls 20lb 2x10 Leg Ext 5lb 2x10 S2S OHP w/ yellow ball 2x10 Bridges x10 LE on Pball bridges, oblq, K2C     11/27/22: Reassessed the patient per findings above Manual: mob with movement for R hip flexion, with R hip lat and inferior distraction, 3 sets 12 reps, AAROM by patient Muscle energy to align pelvis, alternating hip and knee extension with manual resistance 5 sec holds, 3 reps each side at full hip flexion in supine L sidelying for massage gun R lateral and post hip musculature  Supine isometric hip abd/Er in hooklying 10/22/22 Nustep L 5 or 56% Cable Pulleys hip 4 way 10# 10x each leg ( RT leg noteably easier) [place] heel raise and toe raises 20 x each STS 10 x with green tband maintaining abd, then 10 x on airex Seated hip flex 15 x BIL green tband Seated hip ER/IR 15 x RT green tband Leg press 3 sets 10 feet 3 way 40#, SL 20# 10 x Dead Lift 5# 2 sets 10 6 inch step up with opp leg ext 10 x 6 inch step up laterally with opp leg abd 10 x   10/15/22 Reassessed standing lumbar ROM, reproduction of pain with FB, L side  bending motions at end range. Observed less movement R lumbar region with all motions. SI cluster testing:  + R thigh thrust, faber's, compression. Rx: Manual:  muscle energy for pelvic rotation, performed 3 x in this session R side lying for muscle energy technique to improve R lumbar flexion/rot/side bending motion, 2 sets of 5 reps each  Therex: supine isometric hook lying hip abd/ER 30 sec holds, 3 reps Supine isometric alternating hip and knee extension to improve pelvic alignment, 5 sec holds, 3 reps Standing L lumbar side bending and rotation combined with  flexion, L foot on step and sliding hands down L lower leg, 3 reps. Slow movement to open/ stretch R lumbar region Seated isometric hip abd/Er with strap 30 sec holds Briefly reviewed sitting position, to perform standing repeated lumbar extensions and intermittent isometric hip abd/Er during the day for pain management  09/26/22 Nustep L 4 Performed and issued HEP red tband scap stab and hip 3 way [id number] Reviewed BM ,sitting posture and desk ergonics  09/19/22 Nustep L 4 5 min Dyna disc 4 way 10 x Scap stab on disc with core actviation cued 3 way red tband 10 each Core Stab supine Ppt 10 x Bridge 10 x Ppt with hip abd red tband 10 x Ppt with hip flex red tband 10 x HEP [id number] Feet on ball bridge, KTC and obl Isometrc abs with ball 10 x hold 3 sec     09/12/22  Education  PATIENT EDUCATION:  Education details:HEP Person educated: Patient Education method: IT trainer Education comprehension: verbalized understanding, returned demo  HOME EXERCISE PROGRAM:\ Access Code: [email or web address] URL: [email or web address] Date: 10/15/2022 Prepared by: Viann Nielson  Exercises - Hooklying Isometric Hip Abduction with Belt  - 1 x daily - 7 x weekly - 1 sets - 3 reps - 30 hold - Seated Hip Abduction with Resistance  - 1 x daily - 7 x weekly - 1 sets - 3 reps - 30 hold - Supine Single Knee to Chest Stretch  -  1 x daily - 7 x weekly - 1 sets - 5 reps - 3 hold [id number]  ASSESSMENT:  CLINICAL IMPRESSION: Pt attending skilled PT for LBP and R hip weakness.  Continued with therex to engage core and postural muscles, progressed with additional abdominal strengthening.  Tolerated well, no pain during session.  OBJECTIVE IMPAIRMENTS: decreased activity tolerance, decreased coordination, decreased endurance, difficulty walking, decreased ROM, decreased strength, increased fascial restrictions, increased muscle spasms, impaired flexibility, improper body mechanics, postural dysfunction, and pain.   ACTIVITY LIMITATIONS: carrying, lifting, bending, sitting, standing, squatting, sleeping, and locomotion level  PARTICIPATION LIMITATIONS: meal prep, cleaning, laundry, community activity, and occupation  PERSONAL FACTORS: Past/current experiences are also affecting patient's functional outcome.   REHAB POTENTIAL: Good  CLINICAL DECISION MAKING: Stable/uncomplicated  EVALUATION COMPLEXITY: Moderate   GOALS: Goals reviewed with patient? Yes  SHORT TERM GOALS: Target date: 12/11/22  I with basic HEP Baseline: updated today Goal status: Met 12/11/22   LONG TERM GOALS: Target date: 01/08/23  I with final HEP Baseline:  Goal status: 2/27/partially met 11/27/22: progressing  2.  Decrease Owestry score to <21% Baseline: 30% Goal status: INITIAL  3.  Increase FGA to at least 24 Baseline: 21 Goal status: MET 11/27/22  4.  Increase R hip strength to at least 4+/5 Baseline:  Goal status: on going 09/27/22 partially met 10/22/22 fatigues quickly 11/27/22: R hip ER, 3+. R hip flexion 3+. R hip ext 4/5  5.  Patient will be able to perform her normal daily activities with reports of pain < 3/10 Baseline:  Goal status: Met 12/11/22  6.  Patient will ambulate at least 500' on level/ unlevel surfaces, I, no increase in pain. Baseline:  Goal status: on going 09/27/22 partially met 10/22/22   7. New goal 11/27/22:   LEFS score improve from 45/80 to 60/80 PLAN:  PT FREQUENCY: 2x/week  PT DURATION: 6 weeks  PLANNED INTERVENTIONS: Therapeutic exercises, Therapeutic activity, Neuromuscular re-education, Balance training, Gait training, Patient/Family education, Self Care, Joint mobilization, Dry Needling, Spinal mobilization, and Manual  therapy.  PLAN FOR NEXT SESSION: ? How did she respond to jt mobs r hip, additional soft tissue /pain management R lat and post hip, reassess SI jt **no traction, Ionto, Estim, HP/CP, Vaso**   Kyle Stansell, PT, DPT 12/18/2022, 10:55 AM [facility] [facility] [address] [facility]. [place], [place], [address] Phone: [phone]   Fax:  [phone] [address] [facility] [address] [place], [place], [address] Phone: [phone]   Fax:  [phone]  Patient Details  Name: Kimberly Ali MRN: [id number] Date of Birth: 01/30/1976 Referring Provider:  Sharlene Dory*  Encounter Date: 12/18/2022   Early Chars, PT 12/18/2022, 10:55 AM  [place] [facility] [address] [facility]. [place], [place], [address] Phone: [phone]   Fax:  [phone] [address] [facility] [address] [place], [place], [address] Phone: [phone]   Fax:  [phone]  Patient Details  Name: Kimberly Ali MRN: [id number] Date of Birth: March 07, 1976 Referring Provider:  Sharlene Dory*  Encounter Date: 12/18/2022   Early Chars, PT 12/18/2022, 10:55 AM  [place] [facility] [place] [facility] [address] [facility]. [place], [place], [address] Phone: [phone]   Fax:  [phone]

## 2022-12-20 LAB — CERVICOVAGINAL ANCILLARY ONLY
Bacterial Vaginitis (gardnerella): NEGATIVE
Candida Glabrata: NEGATIVE
Candida Vaginitis: NEGATIVE
Comment: NEGATIVE
Comment: NEGATIVE
Comment: NEGATIVE

## 2022-12-25 ENCOUNTER — Encounter: Payer: Self-pay | Admitting: Physical Therapy

## 2022-12-25 ENCOUNTER — Ambulatory Visit: Payer: Medicaid Other | Attending: Family Medicine | Admitting: Physical Therapy

## 2022-12-25 DIAGNOSIS — M25551 Pain in right hip: Secondary | ICD-10-CM | POA: Insufficient documentation

## 2022-12-25 DIAGNOSIS — R262 Difficulty in walking, not elsewhere classified: Secondary | ICD-10-CM

## 2022-12-25 DIAGNOSIS — M5459 Other low back pain: Secondary | ICD-10-CM | POA: Insufficient documentation

## 2022-12-25 NOTE — Therapy (Signed)
OUTPATIENT PHYSICAL THERAPY THORACOLUMBAR    Patient Name: Kimberly Ali MRN: [id number] DOB:[birth year], 47 y.o., female Today's Date: 12/25/2022  END OF SESSION:  PT End of Session - 12/25/22 1014     Visit Number 9    Date for PT Re-Evaluation 01/08/23    PT Start Time 1015    PT Stop Time 1100    PT Time Calculation (min) 45 min    Activity Tolerance Patient tolerated treatment well    Behavior During Therapy [facility] for tasks assessed/performed                Past Medical History:  Diagnosis Date   Allergy    hayfever   Anal skin tag    Eczema    Headache    History of hay fever    History of migraine    Hypertension    Moderate persistent asthma 12/20/2016   Seizures (HCC)    11-25-19- one time occurence, asthma induced, none since   Urticaria    Past Surgical History:  Procedure Laterality Date   ABDOMINAL HYSTERECTOMY     Fibroids   CERVIX SURGERY     x2   DILATION AND CURETTAGE OF UTERUS     EXCISION OF SKIN TAG N/A 07/23/2017   Procedure: EXCISION OF SKIN TAG;  Surgeon: Berna Bue, MD;  Location:  SURGERY CENTER;  Service: General;  Laterality: N/A;   HEMORRHOID SURGERY  2018   states laser surgery for one hemorrhoid   Patient Active Problem List   Diagnosis Date Noted   GAD (generalized anxiety disorder) 07/04/2021   OSA (obstructive sleep apnea) 07/17/2020   Healthcare maintenance 07/17/2020   Seasonal and perennial allergic rhinoconjunctivitis 03/20/2020   Severe persistent asthma without complication 02/17/2020   Adverse food reaction 02/17/2020   Other atopic dermatitis 02/17/2020   Cervical spondylosis 09/03/2019   Vestibular migraine 08/18/2019   Moderate persistent asthma 12/20/2016    PCP: Sharlene Dory, DO  REFERRING PROVIDER: Sharlene Dory, DO  REFERRING DIAG:  M54.50,M79.604 (ICD-10-CM) - Low back pain radiating to right leg  M25.551 (ICD-10-CM) - Hip pain, right    Rationale for  Evaluation and Treatment: Rehabilitation  THERAPY DIAG:  Other low back pain  Pain in right hip  Difficulty in walking, not elsewhere classified  ONSET DATE: 09/02/22  SUBJECTIVE:                                                                                                                                                                                           SUBJECTIVE STATEMENT: "Im good"   PERTINENT HISTORY:  Patient reports  that she was told she has scoliosis in the past, with her hips uneven.  Per Dr visit Mahaley Schwering is a 47 y.o.  female for evaluation and treatment of back pain.    Onset:  1 week ago.  No inj or change in activity.  Location: lower R, radiating into R thigh Character:  aching  Progression of issue:  is unchanged Associated symptoms: radiating down RLE Denies bowel/bladder incontinence, bruising, redness, swelling, falls or weakness Treatment: to date has been OTC NSAIDS and muscle relaxers.   Neurovascular symptoms: no  Musculoskeletal: low back.   Tenderness to palpation: yes over lateral lumbar region on R and also R hip flexor Deformity: no Ecchymosis: no Straight leg test: negative for +Stinchfield Neg FADDIR, DABER, log roll Poor hamstring flexibility b/l  PAIN:  Are you having pain? Yes: NPRS scale: 0/10 Pain location: Starts in R low back, goes into the back and front of her R leg Pain description: throbbing, aching Aggravating factors: Sitting, standing, lying down Relieving factors: none  PRECAUTIONS: None  WEIGHT BEARING RESTRICTIONS: No  FALLS:  Has patient fallen in last 6 months? 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 She walks and stretches as much as possible.  PLOF: Independent  PATIENT GOALS: Decrease pain, be able to sleep, stretch out the  tightness.  NEXT MD VISIT:   OBJECTIVE:   DIAGNOSTIC FINDINGS:  Hip and pelvis Xray Mild bilateral femoroacetabular osteoarthritis. No acute fracture.   SCREENING FOR RED FLAGS: Bowel or bladder incontinence: No Spinal tumors: No Cauda equina syndrome: No Compression fracture: No Abdominal aneurysm: No  COGNITION: Overall cognitive status: Within functional limits for tasks assessed     SENSATION: Patient reports that when she is having pain, she has T/N in her R foot and knee as well.  MUSCLE LENGTH: Hamstrings: Right 64 deg; Left 78 deg 11/27/22:  hamstrings equal and symmetrical B Thomas test: WFL  POSTURE: rounded shoulders, increased lumbar lordosis, anterior pelvic tilt, and right pelvic obliquity  PALPATION: Tight and TTP on L lumbar paraspinals, L QL, L gluts 11/27/22: Non tender R quadratus,very tender  R post SI jt line, R TFL musculature, R iliacus, R glut med, min, pirformis  LUMBAR ROM: WNL, mildly painful in R lat flex, forward flexion 11/27/22:  lumbar ROM wnl   LOWER EXTREMITY ROM:   Generally WFL, mildly tight and sore with L hip movement R hip flexion 105, R hip IR 10 L hip ROM wnl   LOWER EXTREMITY MMT:  L LE 5/5  MMT Right eval Left eval RT 10/22/22 11/27/22 R 12/25/22  Hip flexion 3+  4- 3+ 4+  Hip extension 3+  4+ 4   Hip abduction 4-  4 5   Hip adduction   4+ 5   Hip internal rotation 4-      Hip external rotation 4-   4- 4+  Knee flexion 4-      Knee extension 4-      Ankle dorsiflexion       Ankle plantarflexion       Ankle inversion       Ankle eversion        (Blank rows = not tested)  LUMBAR SPECIAL TESTS:  Slump test: Negative SI tests (-) 11/27/22:  SI testing:  march test + restriction R PSIS Thigh thrust + R  Hip FAIR test with pain R groin  FUNCTIONAL TESTS:  Owestry scale  15 FGA 21 LEFS 45/80 or 56%   GAIT: Distance walked: In clinic distance Assistive device utilized: None Level of assistance: Complete  Independence Comments: Mildly guarded  TODAY'S TREATMENT:                                                                                                                              DATE:  12/25/22 NuStep L 5 x 6 min B shoulder ext pulleys, 5 #  B shoulder rows pulley 5# Leg press 40#, 2 x 10 Resisted side step 30lb x4 each Bridges x10 Passibe stretching to piriformis, HS, and ITB  12/18/22: TM , 30 sec  B shoulder ext pulleys, 5 #  B shoulder rows pulley 5# Leg press 30#, 2 x 10 Supine B heels to chest , feet on 65cm ball,while performing/maintaining pelvic tilt 2 sets 10 reps Supine pelvic tilt combined with therapeutic ball LTR's 10 x Supine pelvic tilt , thighs on therapeutic ball, alt hip flexion 10x Supine bridge thighs on therapeutic ball 10x 2 Seated B leg extension 5#, 2 x 10 Hamstring curls 20# 2 x 10 Nustep level 5 B Ue's and LE's 5 min    12/11/22:NuStep L 5 x 6 min Shoulder Ext 5lb 2x10 Standing Rows 5lb 2x10 Leg press 30lb 2x10 Hamstring curls 20lb 2x10 Leg Ext 5lb 2x10 S2S OHP w/ yellow ball 2x10 Bridges x10 LE on Pball bridges, oblq, K2C     11/27/22: Reassessed the patient per findings above Manual: mob with movement for R hip flexion, with R hip lat and inferior distraction, 3 sets 12 reps, AAROM by patient Muscle energy to align pelvis, alternating hip and knee extension with manual resistance 5 sec holds, 3 reps each side at full hip flexion in supine L sidelying for massage gun R lateral and post hip musculature  Supine isometric hip abd/Er in hooklying 10/22/22 Nustep L 5 or 56% Cable Pulleys hip 4 way 10# 10x each leg ( RT leg noteably easier) [place] heel raise and toe raises 20 x each STS 10 x with green tband maintaining abd, then 10 x on airex Seated hip flex 15 x BIL green tband Seated hip ER/IR 15 x RT green tband Leg press 3 sets 10 feet 3 way 40#, SL 20# 10 x Dead Lift 5# 2 sets 10 6 inch step up with opp leg ext 10 x 6  inch step up laterally with opp leg abd 10 x   10/15/22 Reassessed standing lumbar ROM, reproduction of pain with FB, L side bending motions at end range. Observed less movement R lumbar region with all motions. SI cluster testing:  + R thigh thrust, faber's, compression. Rx: Manual:  muscle energy for pelvic rotation, performed 3 x in this session R side lying for muscle energy technique to improve R lumbar flexion/rot/side bending motion, 2 sets of 5 reps  each  Therex: supine isometric hook lying hip abd/ER 30 sec holds, 3 reps Supine isometric alternating hip and knee extension to improve pelvic alignment, 5 sec holds, 3 reps Standing L lumbar side bending and rotation combined with flexion, L foot on step and sliding hands down L lower leg, 3 reps. Slow movement to open/ stretch R lumbar region Seated isometric hip abd/Er with strap 30 sec holds Briefly reviewed sitting position, to perform standing repeated lumbar extensions and intermittent isometric hip abd/Er during the day for pain management  09/26/22 Nustep L 4 Performed and issued HEP red tband scap stab and hip 3 way [id number] Reviewed BM ,sitting posture and desk ergonics  09/19/22 Nustep L 4 5 min Dyna disc 4 way 10 x Scap stab on disc with core actviation cued 3 way red tband 10 each Core Stab supine Ppt 10 x Bridge 10 x Ppt with hip abd red tband 10 x Ppt with hip flex red tband 10 x HEP [id number] Feet on ball bridge, KTC and obl Isometrc abs with ball 10 x hold 3 sec     09/12/22  Education  PATIENT EDUCATION:  Education details:HEP Person educated: Patient Education method: IT trainer Education comprehension: verbalized understanding, returned demo  HOME EXERCISE PROGRAM:\ Access Code: [email or web address] URL: [email or web address] Date: 10/15/2022 Prepared by: Amy Speaks  Exercises - Hooklying Isometric Hip Abduction with Belt  - 1 x daily - 7 x weekly - 1 sets - 3 reps - 30 hold -  Seated Hip Abduction with Resistance  - 1 x daily - 7 x weekly - 1 sets - 3 reps - 30 hold - Supine Single Knee to Chest Stretch  - 1 x daily - 7 x weekly - 1 sets - 5 reps - 3 hold [id number]  ASSESSMENT:  CLINICAL IMPRESSION: Pt attending skilled PT for LBP and R hip weakness. She enter feeling well reporting no pain for the past three weeks. She is pleased with her current functional status and reports no functional limitation. She has progresses increasing her hip strength.    OBJECTIVE IMPAIRMENTS: decreased activity tolerance, decreased coordination, decreased endurance, difficulty walking, decreased ROM, decreased strength, increased fascial restrictions, increased muscle spasms, impaired flexibility, improper body mechanics, postural dysfunction, and pain.   ACTIVITY LIMITATIONS: carrying, lifting, bending, sitting, standing, squatting, sleeping, and locomotion level  PARTICIPATION LIMITATIONS: meal prep, cleaning, laundry, community activity, and occupation  PERSONAL FACTORS: Past/current experiences are also affecting patient's functional outcome.   REHAB POTENTIAL: Good  CLINICAL DECISION MAKING: Stable/uncomplicated  EVALUATION COMPLEXITY: Moderate   GOALS: Goals reviewed with patient? Yes  SHORT TERM GOALS: Target date: 12/11/22  I with basic HEP Baseline: updated today Goal status: Met 12/11/22   LONG TERM GOALS: Target date: 01/08/23  I with final HEP Baseline:  Goal status: 2/27/partially met 12/25/22: Met  2.  Decrease Owestry score to <21% Baseline: 30% Goal status: INITIAL  3.  Increase FGA to at least 24 Baseline: 21 Goal status: MET 11/27/22  4.  Increase R hip strength to at least 4+/5 Baseline:  Goal status Met 12/25/22  5.  Patient will be able to perform her normal daily activities with reports of pain < 3/10 Baseline:  Goal status: Met 12/11/22  6.  Patient will ambulate at least 500' on level/ unlevel surfaces, I, no increase in pain. Baseline:   Goal status: Met 12/25/22   7. New goal 11/27/22:  LEFS score improve from 45/80 to 60/80 PLAN:  PT FREQUENCY: 2x/week  PT  DURATION: 6 weeks  PLANNED INTERVENTIONS: Therapeutic exercises, Therapeutic activity, Neuromuscular re-education, Balance training, Gait training, Patient/Family education, Self Care, Joint mobilization, Dry Needling, Spinal mobilization, and Manual therapy.  PLAN FOR NEXT SESSION: D/C PT  PHYSICAL THERAPY DISCHARGE SUMMARY  Visits from Start of Care: 9  Patient agrees to discharge. Patient goals were partially met. Patient is being discharged due to being pleased with the current functional level.  Oley Balm DPT 12/25/22 12:29 PM   Grayce Sessions, PTA 12/25/2022, 10:14 AM

## 2023-01-21 ENCOUNTER — Other Ambulatory Visit: Payer: Self-pay | Admitting: Family Medicine

## 2023-02-28 ENCOUNTER — Encounter: Payer: Self-pay | Admitting: Obstetrics & Gynecology

## 2023-02-28 ENCOUNTER — Encounter: Payer: Self-pay | Admitting: Family Medicine

## 2023-03-03 ENCOUNTER — Ambulatory Visit: Payer: Medicaid Other | Admitting: Family Medicine

## 2023-03-03 ENCOUNTER — Encounter: Payer: Self-pay | Admitting: Family Medicine

## 2023-03-03 ENCOUNTER — Other Ambulatory Visit (HOSPITAL_COMMUNITY)
Admission: RE | Admit: 2023-03-03 | Discharge: 2023-03-03 | Disposition: A | Discharge status: Home / Self Care | Payer: Medicaid Other | Source: Ambulatory Visit | Attending: Obstetrics & Gynecology | Admitting: Obstetrics & Gynecology

## 2023-03-03 ENCOUNTER — Ambulatory Visit: Payer: Medicaid Other

## 2023-03-03 VITALS — BP 149/90 | HR 77 | Ht 65.0 in | Wt 170.0 lb

## 2023-03-03 VITALS — BP 120/80 | HR 81 | Temp 98.6°F | Ht 65.0 in | Wt 170.4 lb

## 2023-03-03 DIAGNOSIS — G43809 Other migraine, not intractable, without status migrainosus: Secondary | ICD-10-CM | POA: Diagnosis not present

## 2023-03-03 DIAGNOSIS — N898 Other specified noninflammatory disorders of vagina: Secondary | ICD-10-CM | POA: Insufficient documentation

## 2023-03-03 DIAGNOSIS — F411 Generalized anxiety disorder: Secondary | ICD-10-CM

## 2023-03-03 DIAGNOSIS — J4540 Moderate persistent asthma, uncomplicated: Secondary | ICD-10-CM | POA: Diagnosis not present

## 2023-03-03 DIAGNOSIS — N760 Acute vaginitis: Secondary | ICD-10-CM

## 2023-03-03 MED ORDER — FLUTICASONE FUROATE-VILANTEROL 200-25 MCG/ACT IN AEPB
INHALATION_SPRAY | RESPIRATORY_TRACT | 2 refills | Status: DC
Start: 2023-03-03 — End: 2023-09-03

## 2023-03-03 MED ORDER — RIZATRIPTAN BENZOATE 10 MG PO TABS
10.0000 mg | ORAL_TABLET | ORAL | 1 refills | Status: DC | PRN
Start: 2023-03-03 — End: 2024-01-12

## 2023-03-03 MED ORDER — CITALOPRAM HYDROBROMIDE 20 MG PO TABS
20.0000 mg | ORAL_TABLET | Freq: Every day | ORAL | 2 refills | Status: AC
Start: 2023-03-03 — End: ?

## 2023-03-03 NOTE — Progress Notes (Signed)
Chief Complaint  Patient presents with   Headache   Migraine    Kimberly Ali is a 47 y.o. female here for evaluation of right headache.  Treatment: no rx med currently Aura: a vibration symptom Palliation: Meds as below, quiet/dark Provocation: no obvious trigger Associated symptoms: nausea, vomiting, sonophobia, photophobia, vertigo Denies: Gum chewing, jaw pain, injury, diplopia, neck stiffness Currently with headache? Slightly Failed therapies: Has been using Aleve w limited results, Excedrin sometimes helps; Imitrex made her feel like a zombie, propranolol did not work, Elavil did not work.   BP 120/80 (BP Location: Left Arm, Patient Position: Sitting, Cuff Size: Normal)   Pulse 81   Temp 98.6 F (37 C) (Oral)   Ht 5\' 5"  (1.651 m)   Wt 170 lb 6 oz (77.3 kg)   SpO2 99%   BMI 28.35 kg/m  General: awake, alert, appearing stated age Eyes: PERRLA, EOMi Lungs: no accessory muscle use Neuro: CN 2-12 intact, no cerebellar signs, DTR's equal and symmetry, no clonus MSK: 5/5 strength throughout, normal gait, no TTP over posterior cervical triangle or paraspinal cervical musculature Psych: Age appropriate judgment and insight, mood and affect normal  GAD (generalized anxiety disorder) - Plan: citalopram (CELEXA) 20 MG tablet  Moderate persistent asthma without complication - Plan: fluticasone furoate-vilanterol (BREO ELLIPTA) 200-25 MCG/ACT AEPB  Chronic, uncontrolled. Failed Imitrex. Not bad enough to start prophylactic at this point. Start Maxalt 10 mg every day prn.  Follow up in 4 weeks. The patient voiced understanding and agreement to the plan.  [name] [place], [place] 9:48 AM 03/03/23

## 2023-03-03 NOTE — Progress Notes (Signed)
Patient presents for self swab. Patient has noticed discharge and vaginal irritation for a few days. Patient has just returned from vacation at the [facility].  Patient requests testing for "everything" on the self swab. Armandina Stammer RN

## 2023-03-03 NOTE — Patient Instructions (Signed)
I am OK filling out paperwork for the migraines.  Let us know if you need anything.

## 2023-03-04 LAB — CERVICOVAGINAL ANCILLARY ONLY
Bacterial Vaginitis (gardnerella): POSITIVE — AB
Candida Glabrata: NEGATIVE
Candida Vaginitis: NEGATIVE
Chlamydia: NEGATIVE
Comment: NEGATIVE
Comment: NEGATIVE
Comment: NEGATIVE
Comment: NEGATIVE
Comment: NEGATIVE
Comment: NORMAL
Neisseria Gonorrhea: NEGATIVE
Trichomonas: NEGATIVE

## 2023-03-04 MED ORDER — METRONIDAZOLE 500 MG PO TABS
500.00 mg | ORAL_TABLET | Freq: Two times a day (BID) | ORAL | 0 refills | Status: AC
Start: 2023-03-04 — End: 2023-03-11

## 2023-03-04 NOTE — Addendum Note (Signed)
Addended by: Jaynie Collins A on: 03/04/2023 01:05 PM   Modules accepted: Orders

## 2023-03-05 ENCOUNTER — Encounter: Payer: Self-pay | Admitting: Obstetrics & Gynecology

## 2023-03-05 ENCOUNTER — Other Ambulatory Visit: Payer: Self-pay

## 2023-03-05 DIAGNOSIS — B9689 Other specified bacterial agents as the cause of diseases classified elsewhere: Secondary | ICD-10-CM

## 2023-03-05 MED ORDER — METRONIDAZOLE 0.75 % VA GEL
VAGINAL | 0 refills | Status: DC
Start: 2023-03-05 — End: 2023-05-07

## 2023-03-06 ENCOUNTER — Ambulatory Visit: Payer: Medicaid Other | Admitting: Dermatology

## 2023-03-10 ENCOUNTER — Telehealth: Payer: Self-pay | Admitting: Family Medicine

## 2023-03-10 NOTE — Telephone Encounter (Signed)
Pt dropped off FMLA paperwork to be filled out by PCP. Pt asks to be called once the forms have been completed and faxed to number on form. Papers placed in PCP's tray in front office.

## 2023-03-12 NOTE — Telephone Encounter (Signed)
Paperwork completed by PCP Patient will pickup original Made a copy for scan Faxed original to [employer] -- Received fax confirmation [phone]  Employee [phone] Case # [id number]

## 2023-03-20 ENCOUNTER — Encounter: Payer: Self-pay | Admitting: Family Medicine

## 2023-04-16 ENCOUNTER — Ambulatory Visit (INDEPENDENT_AMBULATORY_CARE_PROVIDER_SITE_OTHER): Payer: Medicaid Other | Admitting: Dermatology

## 2023-04-16 ENCOUNTER — Encounter: Payer: Self-pay | Admitting: Dermatology

## 2023-04-16 VITALS — BP 147/100

## 2023-04-16 DIAGNOSIS — L309 Dermatitis, unspecified: Secondary | ICD-10-CM | POA: Diagnosis not present

## 2023-04-16 DIAGNOSIS — L408 Other psoriasis: Secondary | ICD-10-CM

## 2023-04-16 DIAGNOSIS — H019 Unspecified inflammation of eyelid: Secondary | ICD-10-CM | POA: Diagnosis not present

## 2023-04-16 MED ORDER — TRIAMCINOLONE ACETONIDE 0.1 % EX OINT: 1.0000 | TOPICAL_OINTMENT | Freq: Two times a day (BID) | CUTANEOUS | 0 refills | Status: DC

## 2023-04-16 MED ORDER — PIMECROLIMUS 1 % EX CREA: TOPICAL_CREAM | Freq: Two times a day (BID) | CUTANEOUS | 3 refills | Status: DC

## 2023-04-16 NOTE — Progress Notes (Signed)
   New Patient Visit   Subjective  Kimberly Ali is a 47 y.o. female who presents for the following: Rash. She has a HX of Eczema since childhood. She is flared today on the eyelids, neck and underarms. She uses Triamcinolone 0.025% prescribed by the PCP for the eyelids daily x 4-5 months. It goes away and comes back. She last saw a Dermatologist in 2017. She is using her sons Triamcinolone 0.1 cream on her body for flares.     The following portions of the chart were reviewed this encounter and updated as appropriate: medications, allergies, medical history  Review of Systems:  No other skin or systemic complaints except as noted in HPI or Assessment and Plan.  Objective  Well appearing patient in no apparent distress; mood and affect are within normal limits.         A focused examination was performed of the following areas: Face, axillae, neck, buttocks  Relevant exam findings are noted in the Assessment and Plan.    Assessment & Plan   1. Eyelid Dermatitis - Assessment: Possible contact dermatitis versus presentation of psoriasis. - Plan: Discontinue hydrocortisone use on eyelids. Prescribe Tacrolimus ointment, apply twice a day until clear. Advise patient to identify and avoid potential triggers such as face wash, shampoo, hand lotion, face lotion, mascara. Recommend using Aquaphor as a barrier when eyelids are clear. Suggest switching to a different moisturizer with sunscreen, such as La Roche-Posay.  2. Inverse Psoriasis - Assessment: Shiny atrophic plaques involving bilateral axilla, scaly plaque on the gluteal cleft, and scaly plaque on the left clavicular neck, lower neck area. - Plan: Prescribe Triamcinolone ointment, apply twice a day for two weeks, then stop and alternate with Tacrolimus. Continue alternating Triamcinolone and Tacrolimus until clear. Schedule follow-up appointment in two months to assess progress and discuss oral medications if  necessary.  3. Body Eczema - Assessment: Persistent eczema on arms, occasional flares under breasts. - Plan: Continue using Triamcinolone as needed. Encourage patient to avoid known allergens and triggers.  Return in about 2 months (around 06/16/2023).  Jaclynn Guarneri, CMA, am acting as scribe for [employer], DO.   Documentation: I have reviewed the above documentation for accuracy and completeness, and I agree with the above.  Langston Reusing, DO

## 2023-04-16 NOTE — Patient Instructions (Addendum)
Dear Patient,  Thank you for visiting my office today. Your dedication to addressing your dermatological concerns and improving your health is greatly appreciated. Below is a summary of the key instructions from our discussion:  - Eyelid Dermatitis Management:   - Discontinue Use: Stop using hydrocortisone on your eyelids.   - New Prescription: Start applying Tacrolimus ointment twice daily on the eyelids until the dermatitis clears. There is no usage duration limit for Tacrolimus.   - Moisturizer Change: Consider switching your facial moisturizer to La Roche-Posay to avoid irritants.  - Body Eczema and Possible Inverse Psoriasis Treatment:   - Initial Treatment: Apply Triamcinolone ointment twice daily to areas affected by eczema or psoriasis for two weeks.   - Subsequent Treatment: After two weeks, switch to Tacrolimus cream for another two weeks.   - Treatment Cycle: Alternate between Triamcinolone and Tacrolimus every two weeks until the affected areas are clear and no longer itchy.  - General Skin Care Advice:   - Preventive Measure: Use Aquaphor as a barrier on clear eyelids to prevent flare-ups.   - Allergen Avoidance: Avoid known allergens in skincare products and monitor for any new triggers.   - Hygiene: Ensure to keep the affected areas clean and dry, especially under the breasts and other skin folds.  - Follow-Up Appointment:   - Next Steps: Schedule a follow-up visit in two months to assess the effectiveness of the treatment and discuss further options if necessary.  - Prescriptions and Pharmacy:   - Medication Pickup: Your prescriptions for Tacrolimus and Triamcinolone have been sent to your pharmacy. Please check with them to ensure they have been received.   - Pharmacy Communication: If there are any issues with medication availability or if you prefer a different formulation, please contact our office immediately.  We are committed to helping you manage your condition  effectively and eagerly anticipate seeing your progress. Should you have any questions or concerns before your next visit, please do not hesitate to reach out to our office.  Warm regards,  Dr. Langston Reusing, MD Dermatology  Important Information  Due to recent changes in healthcare laws, you may see results of your pathology and/or laboratory studies on MyChart before the doctors have had a chance to review them. We understand that in some cases there may be results that are confusing or concerning to you. Please understand that not all results are received at the same time and often the doctors may need to interpret multiple results in order to provide you with the best plan of care or course of treatment. Therefore, we ask that you please give [place] 2 business days to thoroughly review all your results before contacting the office for clarification. Should we see a critical lab result, you will be contacted sooner.   If You Need Anything After Your Visit  If you have any questions or concerns for your doctor, please call our main line at [phone] If no one answers, please leave a voicemail as directed and we will return your call as soon as possible. Messages left after 4 pm will be answered the following business day.   You may also send [place] a message via MyChart. We typically respond to MyChart messages within 1-2 business days.  For prescription refills, please ask your pharmacy to contact our office. Our fax number is [phone].  If you have an urgent issue when the clinic is closed that cannot wait until the next business day, you can page your doctor at  the number below.    Please note that while we do our best to be available for urgent issues outside of office hours, we are not available 24/7.   If you have an urgent issue and are unable to reach [place], you may choose to seek medical care at your doctor's office, retail clinic, urgent care center, or emergency room.  If you have  a medical emergency, please immediately call 911 or go to the emergency department. In the event of inclement weather, please call our main line at [phone] for an update on the status of any delays or closures.  Dermatology Medication Tips: Please keep the boxes that topical medications come in in order to help keep track of the instructions about where and how to use these. Pharmacies typically print the medication instructions only on the boxes and not directly on the medication tubes.   If your medication is too expensive, please contact our office at [phone] or send [place] a message through MyChart.   We are unable to tell what your co-pay for medications will be in advance as this is different depending on your insurance coverage. However, we may be able to find a substitute medication at lower cost or fill out paperwork to get insurance to cover a needed medication.   If a prior authorization is required to get your medication covered by your insurance company, please allow [place] 1-2 business days to complete this process.  Drug prices often vary depending on where the prescription is filled and some pharmacies may offer cheaper prices.  The website www.goodrx.com contains coupons for medications through different pharmacies. The prices here do not account for what the cost may be with help from insurance (it may be cheaper with your insurance), but the website can give you the price if you did not use any insurance.  - You can print the associated coupon and take it with your prescription to the pharmacy.  - You may also stop by our office during regular business hours and pick up a GoodRx coupon card.  - If you need your prescription sent electronically to a different pharmacy, notify our office through [facility] or by phone at [phone]

## 2023-05-02 ENCOUNTER — Telehealth: Payer: Self-pay

## 2023-05-02 NOTE — Telephone Encounter (Signed)
Patient called to say the pharmacy is waiting on authorization for her medication. I looked at the note and it says Tacrolimus in under one diagnosis and Pimecrolimus under the other. I see where Pimecrolimus was sent in and I just want to clarify that this correct or should it be Tacrolimus. The patient did state that she has used Pimecrolimus in the past when her hands flared and it did not not work well.

## 2023-05-06 MED ORDER — TACROLIMUS 0.1 % EX OINT: TOPICAL_OINTMENT | Freq: Two times a day (BID) | CUTANEOUS | 2 refills | Status: DC

## 2023-05-06 NOTE — Telephone Encounter (Signed)
I do not see that tacrolimus was ever sent to the pharmacy, only pimecrolimus. I can send the tacrolimus and see if they will cover it.

## 2023-05-06 NOTE — Telephone Encounter (Signed)
Yes, it should be tacrolimus according to the notes.  Did the pharmacy reject tacrolimus and we switched to pimecrolimus?  If neither are covered lets use calcipotriene cream.  Thanks!

## 2023-05-07 ENCOUNTER — Encounter: Payer: Self-pay | Admitting: Family Medicine

## 2023-05-07 ENCOUNTER — Telehealth (INDEPENDENT_AMBULATORY_CARE_PROVIDER_SITE_OTHER): Payer: Medicaid Other | Admitting: Family Medicine

## 2023-05-07 DIAGNOSIS — G43809 Other migraine, not intractable, without status migrainosus: Secondary | ICD-10-CM | POA: Diagnosis not present

## 2023-05-07 NOTE — Telephone Encounter (Signed)
Sounds good!  Let's send tacrolimus.  Thanks :)

## 2023-05-07 NOTE — Progress Notes (Signed)
Chief Complaint  Patient presents with   Headache    paperwork    Subjective: Patient is a 47 y.o. female here for headaches. We are interacting via web portal for an electronic face-to-face visit. I verified patient's ID using 2 identifiers. Patient agreed to proceed with visit via this method. Patient is at home, I am at office. Patient and I are present for visit.   Patient has a history of migraines.  Over the past couple weeks, she been waking up daily with 1.  For severe headaches, she will take Maxalt.  Otherwise she takes Excedrin which does help.  Stress levels have been stable.  She is compliant with her medications.  She is not on a prophylactic.  She gets very sensitive to sound and light when she has migraines.  No nausea, vomiting, vision changes, difficulty with speech, trouble swallowing.  She needs her FMLA form redone as it was reported to be incomplete.  She looked at it and could not find out what they were talking about.  Past Medical History:  Diagnosis Date   Allergy    hayfever   Anal skin tag    Eczema    Headache    History of hay fever    History of migraine    Hypertension    Moderate persistent asthma 12/20/2016   Seizures (HCC)    11-25-19- one time occurence, asthma induced, none since   Urticaria     Objective: No conversational dyspnea Age appropriate judgment and insight Nml affect and mood  Assessment and Plan: Vestibular migraine  Chronic, not controlled.  Start magnesium 200-250 mg daily.  Continue Maxalt 10 mg daily as needed.  Follow-up in 2 to 3 weeks if no improvement. The patient voiced understanding and agreement to the plan.  [name] [place], DO 05/07/23  11:44 AM

## 2023-05-15 ENCOUNTER — Telehealth: Payer: Self-pay | Admitting: Family Medicine

## 2023-05-15 NOTE — Telephone Encounter (Signed)
Pt dropped off fmla papers. Placed in pcp's folder.

## 2023-05-16 NOTE — Telephone Encounter (Signed)
PCP completed and faxed to [employer] at [phone] Received fax confirmation Patient made aware of completion.

## 2023-05-26 ENCOUNTER — Encounter: Payer: Self-pay | Admitting: Family Medicine

## 2023-06-18 ENCOUNTER — Ambulatory Visit: Payer: Medicaid Other | Admitting: Dermatology

## 2023-07-07 ENCOUNTER — Emergency Department (HOSPITAL_COMMUNITY)
Admission: EM | Admit: 2023-07-07 | Discharge: 2023-07-07 | Disposition: A | Discharge status: Home / Self Care | Payer: Medicaid Other | Attending: Emergency Medicine | Admitting: Emergency Medicine

## 2023-07-07 ENCOUNTER — Emergency Department (HOSPITAL_COMMUNITY): Payer: Medicaid Other

## 2023-07-07 DIAGNOSIS — Z79899 Other long term (current) drug therapy: Secondary | ICD-10-CM | POA: Insufficient documentation

## 2023-07-07 DIAGNOSIS — R0789 Other chest pain: Secondary | ICD-10-CM | POA: Diagnosis not present

## 2023-07-07 DIAGNOSIS — R000 Tachycardia, unspecified: Secondary | ICD-10-CM | POA: Diagnosis not present

## 2023-07-07 DIAGNOSIS — I10 Essential (primary) hypertension: Secondary | ICD-10-CM | POA: Diagnosis not present

## 2023-07-07 DIAGNOSIS — J4541 Moderate persistent asthma with (acute) exacerbation: Secondary | ICD-10-CM | POA: Diagnosis not present

## 2023-07-07 DIAGNOSIS — Z91010 Allergy to peanuts: Secondary | ICD-10-CM | POA: Insufficient documentation

## 2023-07-07 DIAGNOSIS — J45909 Unspecified asthma, uncomplicated: Secondary | ICD-10-CM | POA: Diagnosis not present

## 2023-07-07 DIAGNOSIS — Z7951 Long term (current) use of inhaled steroids: Secondary | ICD-10-CM | POA: Diagnosis not present

## 2023-07-07 DIAGNOSIS — R0602 Shortness of breath: Secondary | ICD-10-CM | POA: Diagnosis not present

## 2023-07-07 LAB — CBC WITH DIFFERENTIAL/PLATELET
Abs Immature Granulocytes: 0.02 10*3/uL (ref 0.00–0.07)
Basophils Absolute: 0 10*3/uL (ref 0.0–0.1)
Basophils Relative: 1 %
Eosinophils Absolute: 0.1 10*3/uL (ref 0.0–0.5)
Eosinophils Relative: 1 %
HCT: 36.4 % (ref 36.0–46.0)
Hemoglobin: 11.4 g/dL — ABNORMAL LOW (ref 12.0–15.0)
Immature Granulocytes: 0 %
Lymphocytes Relative: 23 %
Lymphs Abs: 1.7 10*3/uL (ref 0.7–4.0)
MCH: 27.3 pg (ref 26.0–34.0)
MCHC: 31.3 g/dL (ref 30.0–36.0)
MCV: 87.1 fL (ref 80.0–100.0)
Monocytes Absolute: 0.7 10*3/uL (ref 0.1–1.0)
Monocytes Relative: 10 %
Neutro Abs: 4.9 10*3/uL (ref 1.7–7.7)
Neutrophils Relative %: 65 %
Platelets: 304 10*3/uL (ref 150–400)
RBC: 4.18 MIL/uL (ref 3.87–5.11)
RDW: 14 % (ref 11.5–15.5)
WBC: 7.5 10*3/uL (ref 4.0–10.5)
nRBC: 0 % (ref 0.0–0.2)

## 2023-07-07 LAB — BASIC METABOLIC PANEL
Anion gap: 8 (ref 5–15)
BUN: 9 mg/dL (ref 6–20)
CO2: 26 mmol/L (ref 22–32)
Calcium: 9.2 mg/dL (ref 8.9–10.3)
Chloride: 104 mmol/L (ref 98–111)
Creatinine, Ser: 0.9 mg/dL (ref 0.44–1.00)
GFR, Estimated: >60 mL/min (ref >60)
Glucose, Bld: 89 mg/dL (ref 70–99)
Potassium: 3.6 mmol/L (ref 3.5–5.1)
Sodium: 138 mmol/L (ref 135–145)

## 2023-07-07 LAB — TROPONIN I (HIGH SENSITIVITY)
Troponin I (High Sensitivity): 9 ng/L (ref <18)
Troponin I (High Sensitivity): 10 ng/L (ref <18)

## 2023-07-07 MED ORDER — METHYLPREDNISOLONE SODIUM SUCC 125 MG IJ SOLR
125.0000 mg | Freq: Once | INTRAMUSCULAR | Status: AC
  Administered 2023-07-07: 125 mg via INTRAVENOUS
  Filled 2023-07-07: qty 2

## 2023-07-07 MED ORDER — IPRATROPIUM-ALBUTEROL 0.5-2.5 (3) MG/3ML IN SOLN
3.0000 mL | Freq: Once | RESPIRATORY_TRACT | Status: AC
  Administered 2023-07-07: 3 mL via RESPIRATORY_TRACT
  Filled 2023-07-07: qty 3

## 2023-07-07 MED ORDER — IPRATROPIUM-ALBUTEROL 0.5-2.5 (3) MG/3ML IN SOLN: 3.0000 mL | RESPIRATORY_TRACT | 0 refills | Status: AC | PRN

## 2023-07-07 MED ORDER — PREDNISONE 20 MG PO TABS: 20.0000 mg | ORAL_TABLET | Freq: Every day | ORAL | 0 refills | Status: AC

## 2023-07-07 NOTE — Discharge Instructions (Signed)
It was a pleasure taking care of you here in the emergency department  Please take the steroids over the next 4 days I have also refilled your nebulizer treatments  Return for any new or worsening symptoms

## 2023-07-07 NOTE — ED Provider Notes (Signed)
[place] [facility]ENC[facility] DE[facility]MENT AT [facility] Provider Note   CSN: [id number] Arrival date & time: 07/07/23  1503    History  Chief Complaint  Patient presents with   Shortness of Breath    Kimberly Ali is a 47 y.o. female hx of asthma here for evaluation of shortness of breath.  She has history of asthma and feels similar.  She used her home albuterol inhaler and still with some wheezing.  Stated that she is unable to do nebulizer as she is out of the tubing as well as the DuoNeb medication.  When EMS arrived she had wheezing and diminished lung sounds.  They gave her 1 DuoNeb with significant improvement.  Patient denies any current chest pain.  She states her shortness breath is significantly improved.  No history of PE or DVT.  Some associated nonproductive cough.  She denies any pain or swelling to lower extremities.  No history of malignancy, OCPs, long road trips, pain or swelling to lower extremities.  No recent illnesses.  No fever, congestion, rhinorrhea, back pain, abdominal pain.  She does state that she has had to use her rescue inhaler more frequently over the last few months due to the "changes of the season."  HPI     Home Medications Prior to Admission medications   Medication Sig Start Date End Date Taking? Authorizing Provider  ipratropium-albuterol (DUONEB) 0.5-2.5 (3) MG/3ML SOLN Take 3 mLs by nebulization every 4 (four) hours as needed. 07/07/23  Yes Ural Acree A, PA-C  predniSONE (DELTASONE) 20 MG tablet Take 1 tablet (20 mg total) by mouth daily for 4 days. 07/07/23 07/11/23 Yes Jenaye Rickert A, PA-C  citalopram (CELEXA) 20 MG tablet Take 1 tablet (20 mg total) by mouth daily. 03/03/23   Sharlene Dory, DO  EPINEPHrine 0.3 mg/0.3 mL IJ SOAJ injection Inject 0.3 mg into the muscle as needed for anaphylaxis. 10/09/22   Sharlene Dory, DO  fluticasone (FLONASE) 50 MCG/ACT nasal spray Place 2 sprays into both nostrils  daily. 12/17/21   Sharlene Dory, DO  fluticasone furoate-vilanterol (BREO ELLIPTA) 200-25 MCG/ACT AEPB INHALE 1 PUFF BY MOUTH DAILY 03/03/23   Sharlene Dory, DO  montelukast (SINGULAIR) 10 MG tablet TAKE 1 TABLET BY MOUTH EVERYDAY AT BEDTIME 08/22/20   Wendling, [name], DO  omeprazole (PRILOSEC) 20 MG capsule Take 1 capsule (20 mg total) by mouth daily. 01/17/22   Palumbo, April, MD  pimecrolimus (ELIDEL) 1 % cream Apply topically 2 (two) times daily. Apply to eyelids twice daily until clear. Use on body twice daily for 2 weeks alternating with Triamcinolone ointment. 04/16/23   Terri Piedra, DO  rizatriptan (MAXALT) 10 MG tablet Take 1 tablet (10 mg total) by mouth as needed for migraine. May repeat in 2 hours if needed 03/03/23   Sharlene Dory, DO  tacrolimus (PROTOPIC) 0.1 % ointment Apply topically 2 (two) times daily. 05/06/23   Terri Piedra, DO  triamcinolone ointment (KENALOG) 0.1 % Apply 1 Application topically 2 (two) times daily. Apply to affected areas on the body twice daily for 2 weeks. Alternate with Pimecrolimus cream 04/16/23   Terri Piedra, DO  valACYclovir (VALTREX) 1000 MG tablet TAKE 2 TABS AND REPEAT IN 12 HRS FOR COLD SORE OUTBREAKS. 04/10/22   Sharlene Dory, DO  VENTOLIN HFA 108 (90 Base) MCG/ACT inhaler INHALE 1-2 PUFFS BY MOUTH EVERY 6 HOURS AS NEEDED FOR WHEEZE OR SHORTNESS OF BREATH 01/21/23   Wendling, [name],  DO      Allergies    Egg-derived products, Haemophilus influenzae vaccines, Peanut-containing drug products, Almond (diagnostic), and Dust mite extract    Review of Systems   Review of Systems  Constitutional: Negative.   HENT: Negative.    Respiratory:  Positive for cough, chest tightness, shortness of breath and wheezing. Negative for apnea, choking and stridor.   Cardiovascular: Negative.   Gastrointestinal: Negative.   Genitourinary: Negative.   Musculoskeletal: Negative.   Skin: Negative.    Neurological: Negative.   All other systems reviewed and are negative.   Physical Exam Updated Vital Signs BP (!) 157/100 (BP Location: Right Arm)   Pulse 95   Temp 98.7 F (37.1 C) (Oral)   Resp 14   SpO2 99%  Physical Exam Vitals and nursing note reviewed.  Constitutional:      General: She is not in acute distress.    Appearance: She is well-developed. She is not ill-appearing, toxic-appearing or diaphoretic.  HENT:     Head: Normocephalic and atraumatic.  Eyes:     Pupils: Pupils are equal, round, and reactive to light.  Cardiovascular:     Rate and Rhythm: Normal rate.     Pulses: Normal pulses.     Heart sounds: Normal heart sounds.  Pulmonary:     Effort: Pulmonary effort is normal. No respiratory distress.     Breath sounds: Wheezing present.     Comments: Minimal expiratory wheeze, peaks in full sentences without difficulty Abdominal:     General: Bowel sounds are normal. There is no distension.     Palpations: Abdomen is soft.     Tenderness: There is no abdominal tenderness. There is no rebound.  Musculoskeletal:        General: Normal range of motion.     Cervical back: Normal range of motion.     Right lower leg: No tenderness. No edema.     Left lower leg: No tenderness. No edema.     Comments: No bony tenderness, compartments soft.  Nontender calves bilaterally  Skin:    General: Skin is warm and dry.     Capillary Refill: Capillary refill takes less than 2 seconds.     Comments: No obvious rashes or lesions on exposed skin  Neurological:     General: No focal deficit present.     Mental Status: She is alert.  Psychiatric:        Mood and Affect: Mood normal.     ED Results / Procedures / Treatments   Labs (all labs ordered are listed, but only abnormal results are displayed) Labs Reviewed  CBC WITH DIFFERENTIAL/PLATELET - Abnormal; Notable for the following components:      Result Value   Hemoglobin 11.4 (*)    All other components within  normal limits  BASIC METABOLIC PANEL  TROPONIN I (HIGH SENSITIVITY)  TROPONIN I (HIGH SENSITIVITY)    EKG EKG Interpretation Date/Time:  Monday July 07 2023 16:48:41 EST Ventricular Rate:  96 PR Interval:  176 QRS Duration:  82 QT Interval:  335 QTC Calculation: 424 R Axis:   80  Text Interpretation: Sinus rhythm Nonspecific T abnormalities, diffuse leads flipped t waves in inferior and lateral leads not seen on prior Otherwise no significant change Confirmed by Melene Plan [phone]) on 07/07/2023 5:41:13 PM  Radiology DG Chest 2 View  Result Date: 07/07/2023 CLINICAL DATA:  Shortness of breath. EXAM: CHEST - 2 VIEW COMPARISON:  01/17/2022 FINDINGS: The cardiomediastinal contours are normal. The lungs  are clear. Pulmonary vasculature is normal. No consolidation, pleural effusion, or pneumothorax. No acute osseous abnormalities are seen. IMPRESSION: No active cardiopulmonary disease. Electronically Signed   By: Narda Rutherford M.D.   On: 07/07/2023 18:38    Procedures Procedures    Medications Ordered in ED Medications  methylPREDNISolone sodium succinate (SOLU-MEDROL) 125 mg/2 mL injection 125 mg (125 mg Intravenous Given 07/07/23 1645)  ipratropium-albuterol (DUONEB) 0.5-2.5 (3) MG/3ML nebulizer solution 3 mL (3 mLs Nebulization Given 07/07/23 1645)   ED Course/ Medical Decision Making/ A&P    47 year old here for evaluation of shortness of breath which she feels is consistent with her prior asthma exacerbations.  She feels significantly improved after DuoNeb with EMS, states she is unable to perform her home nebulizer due to being out of the medication as well as the tubing.  She is no clinical evidence of VTE on exam.  Has mild expiratory wheeze.  She denies any chest pain.  Speaks in full sentences without difficulty.  Will plan on labs, imaging and reassess  Labs and imaging personally viewed and interpreted:  CBC without leukocytosis Metabolic panel without  significant changes delta troponin flat EKG without ischemic changes Chest x-ray without pneumonia, pneumothorax, edema, widened mediastinum  Patient reassessed after nebulizer, steroids.  Has clear lung sounds.  She is now asymptomatic.  Will refill with her home nebulizer treatment which she has been out of, we will start her on short course of steroids.  At this time I low suspicion for acute ACS, PE, dissection, pneumothorax, pneumonia, sepsis, pulmonary edema, myocarditis, pericarditis,  unstable angina.  Will have her follow-up outpatient, return for any worsening symptoms  The patient has been appropriately medically screened and/or stabilized in the ED. I have low suspicion for any other emergent medical condition which would require further screening, evaluation or treatment in the ED or require inpatient management.  Patient is hemodynamically stable and in no acute distress.  Patient able to ambulate in department prior to ED.  Evaluation does not show acute pathology that would require ongoing or additional emergent interventions while in the emergency department or further inpatient treatment.  I have discussed the diagnosis with the patient and answered all questions.  Pain is been managed while in the emergency department and patient has no further complaints prior to discharge.  Patient is comfortable with plan discussed in room and is stable for discharge at this time.  I have discussed strict return precautions for returning to the emergency department.  Patient was encouraged to follow-up with PCP/specialist refer to at discharge.                                 Medical Decision Making Amount and/or Complexity of Data Reviewed External Data Reviewed: labs, radiology, ECG and notes. Labs: ordered. Decision-making details documented in ED Course. Radiology: ordered and independent interpretation performed. Decision-making details documented in ED Course. ECG/medicine tests:  ordered and independent interpretation performed. Decision-making details documented in ED Course.  Risk OTC drugs. Prescription drug management. Parenteral controlled substances. Decision regarding hospitalization. Diagnosis or treatment significantly limited by social determinants of health.          Final Clinical Impression(s) / ED Diagnoses Final diagnoses:  Moderate persistent asthma with exacerbation    Rx / DC Orders ED Discharge Orders          Ordered    predniSONE (DELTASONE) 20 MG tablet  Daily  07/07/23 2030    ipratropium-albuterol (DUONEB) 0.5-2.5 (3) MG/3ML SOLN  Every 4 hours PRN        07/07/23 2030              Chester Romero A, PA-C 07/07/23 2046    Melene Plan, DO 07/07/23 2048

## 2023-07-07 NOTE — ED Triage Notes (Signed)
Pt BIBA with c/o recurrent SOB, hx of asthma. Pt took inhaler and received some relief, but SOB returned. Lung sounds diminished when EMS arrived along with reports of chest tightness. 5mg  albuterol/ 5mg  atrovent duoneb given by EMS. Lung sounds now clear.   99% RA- 170/100- RR 24- HR 106

## 2023-07-08 ENCOUNTER — Encounter: Payer: Self-pay | Admitting: Family Medicine

## 2023-07-08 NOTE — Telephone Encounter (Signed)
Pt was called and scheduled with Kimberly Ali on 11.13.24 due to PCP not being in office

## 2023-07-09 ENCOUNTER — Encounter: Payer: Self-pay | Admitting: Physician Assistant

## 2023-07-09 ENCOUNTER — Ambulatory Visit: Payer: Medicaid Other | Admitting: Physician Assistant

## 2023-07-09 VITALS — BP 150/100 | HR 76 | Temp 98.2°F | Ht 65.0 in | Wt 168.0 lb

## 2023-07-09 DIAGNOSIS — R030 Elevated blood-pressure reading, without diagnosis of hypertension: Secondary | ICD-10-CM

## 2023-07-09 DIAGNOSIS — J4541 Moderate persistent asthma with (acute) exacerbation: Secondary | ICD-10-CM

## 2023-07-09 MED ORDER — MONTELUKAST SODIUM 10 MG PO TABS: 10.0000 mg | ORAL_TABLET | Freq: Every day | ORAL | 1 refills | Status: DC

## 2023-07-09 NOTE — Progress Notes (Signed)
Established patient visit   Patient: Kimberly Ali   DOB: Apr 03, 1976   47 y.o. Female  MRN: [id number] Visit Date: 07/09/2023  Today's healthcare provider: Alfredia Ferguson, PA-C   Cc. ED f/u  Subjective     Pt was seen 11/11 in the ED for an asthma exacerbation, brought by EMS. She was given methylprednisone, labs were checked d/t chest pain, trops normal.  CXR normal. She was discharged with duoneb and prednisone.  She regularly manages with asthma with prn duonebs, albuterol inhaler, and daily breo ellipta.  Today, patient reports overall her breathing has improved.  She did not need to take her albuterol inhaler at all yesterday, but did needed it this morning. Improved wheezing and chest tightness.  She reports her nebulizer at home is not functioning despite the new tubing.   Medications: Outpatient Medications Prior to Visit  Medication Sig   citalopram (CELEXA) 20 MG tablet Take 1 tablet (20 mg total) by mouth daily.   EPINEPHrine 0.3 mg/0.3 mL IJ SOAJ injection Inject 0.3 mg into the muscle as needed for anaphylaxis.   fluticasone (FLONASE) 50 MCG/ACT nasal spray Place 2 sprays into both nostrils daily.   fluticasone furoate-vilanterol (BREO ELLIPTA) 200-25 MCG/ACT AEPB INHALE 1 PUFF BY MOUTH DAILY   ipratropium-albuterol (DUONEB) 0.5-2.5 (3) MG/3ML SOLN Take 3 mLs by nebulization every 4 (four) hours as needed.   omeprazole (PRILOSEC) 20 MG capsule Take 1 capsule (20 mg total) by mouth daily.   pimecrolimus (ELIDEL) 1 % cream Apply topically 2 (two) times daily. Apply to eyelids twice daily until clear. Use on body twice daily for 2 weeks alternating with Triamcinolone ointment.   predniSONE (DELTASONE) 20 MG tablet Take 1 tablet (20 mg total) by mouth daily for 4 days.   rizatriptan (MAXALT) 10 MG tablet Take 1 tablet (10 mg total) by mouth as needed for migraine. May repeat in 2 hours if needed   tacrolimus (PROTOPIC) 0.1 % ointment Apply topically 2 (two)  times daily.   triamcinolone ointment (KENALOG) 0.1 % Apply 1 Application topically 2 (two) times daily. Apply to affected areas on the body twice daily for 2 weeks. Alternate with Pimecrolimus cream   valACYclovir (VALTREX) 1000 MG tablet TAKE 2 TABS AND REPEAT IN 12 HRS FOR COLD SORE OUTBREAKS.   VENTOLIN HFA 108 (90 Base) MCG/ACT inhaler INHALE 1-2 PUFFS BY MOUTH EVERY 6 HOURS AS NEEDED FOR WHEEZE OR SHORTNESS OF BREATH   [DISCONTINUED] montelukast (SINGULAIR) 10 MG tablet TAKE 1 TABLET BY MOUTH EVERYDAY AT BEDTIME   No facility-administered medications prior to visit.    Review of Systems  Constitutional:  Negative for fatigue and fever.  Respiratory:  Negative for cough and shortness of breath.   Cardiovascular:  Negative for chest pain and leg swelling.  Gastrointestinal:  Negative for abdominal pain.  Neurological:  Negative for dizziness and headaches.       Objective    BP (!) 150/100   Pulse 76   Temp 98.2 F (36.8 C) (Oral)   Ht 5\' 5"  (1.651 m)   Wt 168 lb (76.2 kg)   SpO2 100%   BMI 27.96 kg/m    Physical Exam Constitutional:      General: She is awake.     Appearance: She is well-developed.  HENT:     Head: Normocephalic.  Eyes:     Conjunctiva/sclera: Conjunctivae normal.  Cardiovascular:     Rate and Rhythm: Normal rate and regular rhythm.  Heart sounds: Normal heart sounds.  Pulmonary:     Effort: Pulmonary effort is normal.     Breath sounds: Normal breath sounds. No wheezing.  Skin:    General: Skin is warm.  Neurological:     Mental Status: She is alert and oriented to person, place, and time.  Psychiatric:        Attention and Perception: Attention normal.        Mood and Affect: Mood normal.        Speech: Speech normal.        Behavior: Behavior is cooperative.      No results found for any visits on 07/09/23.  Assessment & Plan    Moderate persistent asthma with acute exacerbation -     For home use only DME Nebulizer machine -      Montelukast Sodium; Take 1 tablet (10 mg total) by mouth at bedtime.  Dispense: 90 tablet; Refill: 1  Elevated blood pressure reading in office without diagnosis of hypertension   Encouraged patient to follow-up in office as soon as she feels her flares are beginning.  Continue with prescribed treatment from the emergency room.  Will send in a prescription to get her a new nebulizer machine. Refilled regular montelukast.   Over the last few visits her blood pressure has been elevated thought to be 2/2 asthma exacerbation and steroid/albuterol use.  Still elevated in office today.  She does have a blood pressure machine at home, advised to monitor her BP regularly at home, if her pressure is still consistently over 130/90 to follow back in office with her PCP.  Return if symptoms worsen or fail to improve.      Alfredia Ferguson, PA-C  [facility] [phone] (phone) [phone] (fax)  [facility]

## 2023-09-03 ENCOUNTER — Other Ambulatory Visit: Payer: Self-pay

## 2023-09-03 DIAGNOSIS — J4540 Moderate persistent asthma, uncomplicated: Secondary | ICD-10-CM

## 2023-09-03 MED ORDER — FLUTICASONE FUROATE-VILANTEROL 200-25 MCG/ACT IN AEPB: INHALATION_SPRAY | RESPIRATORY_TRACT | 2 refills | Status: AC

## 2023-09-16 ENCOUNTER — Other Ambulatory Visit: Payer: Self-pay | Admitting: Family Medicine

## 2023-09-16 DIAGNOSIS — Z1231 Encounter for screening mammogram for malignant neoplasm of breast: Secondary | ICD-10-CM

## 2023-09-17 ENCOUNTER — Ambulatory Visit
Admission: RE | Admit: 2023-09-17 | Discharge: 2023-09-17 | Discharge status: Home / Self Care | Payer: Medicaid Other | Source: Ambulatory Visit | Attending: Family Medicine

## 2023-09-17 DIAGNOSIS — Z1231 Encounter for screening mammogram for malignant neoplasm of breast: Secondary | ICD-10-CM

## 2023-10-11 ENCOUNTER — Encounter: Payer: Self-pay | Admitting: Obstetrics & Gynecology

## 2023-10-20 ENCOUNTER — Encounter: Payer: Self-pay | Admitting: Family Medicine

## 2023-10-20 ENCOUNTER — Ambulatory Visit (INDEPENDENT_AMBULATORY_CARE_PROVIDER_SITE_OTHER): Payer: BC Managed Care – PPO | Admitting: Family Medicine

## 2023-10-20 VITALS — BP 128/82 | HR 73 | Temp 98.0°F | Resp 16 | Ht 65.0 in | Wt 168.0 lb

## 2023-10-20 DIAGNOSIS — K644 Residual hemorrhoidal skin tags: Secondary | ICD-10-CM | POA: Diagnosis not present

## 2023-10-20 MED ORDER — FLUTICASONE PROPIONATE 50 MCG/ACT NA SUSP: 2.0000 | Freq: Every day | NASAL | 6 refills | Status: DC

## 2023-10-20 MED ORDER — HYDROCORTISONE 2.5 % EX CREA: TOPICAL_CREAM | Freq: Two times a day (BID) | CUTANEOUS | 0 refills | Status: DC

## 2023-10-20 NOTE — Progress Notes (Addendum)
 Chief Complaint  Patient presents with   Rash    Discuss Rash    Kimberly Ali is a 48 y.o. female here for a skin complaint.  Duration: several months Location: anal region Pruritic? Yes Painful? No Drainage? No Hygiene is not an issue.  New soaps/lotions/topicals/detergents? No No recent travel or Other associated symptoms: no fevers; interestingly does not hurt every day.  Therapies tried thus far: hydrocortisone helped a little, butt paste  Past Medical History:  Diagnosis Date   Allergy    hayfever   Anal skin tag    Eczema    Headache    History of hay fever    History of migraine    Hypertension    Moderate persistent asthma 12/20/2016   Seizures (HCC)    11-25-19- one time occurence, asthma induced, none since   Urticaria     BP 128/82 (BP Location: Left Arm, Cuff Size: Normal)   Pulse 73   Temp 98 F (36.7 C) (Oral)   Resp 16   Ht 5\' 5"  (1.651 m)   Wt 168 lb (76.2 kg)   SpO2 95%   BMI 27.96 kg/m  Gen: awake, alert, appearing stated age Lungs: No accessory muscle use Skin: Rectal area examined in the presence of a female chaperone.  No signs of bleeding or deformity, no scaling.  There is an external hemorrhoid that is not thrombosed or TTP.  Upon palpation, she notes that is the area that itches. No drainage, erythema, fluctuance, excoriation Psych: Age appropriate judgment and insight  External hemorrhoid - Plan: hydrocortisone 2.5 % cream  Chronic issue, not controlled.  Push hydration, fiber supplement.  Hydrocortisone cream 2.5% twice daily for itching relief.  Consider sitz bath's. F/u prn. The patient voiced understanding and agreement to the plan.  [name] [place], DO 10/20/23 9:35 AM

## 2023-10-20 NOTE — Patient Instructions (Addendum)
 Take Metamucil or Benefiber daily.  Stay hydrated.  Let us know if you need anything.

## 2023-10-22 ENCOUNTER — Ambulatory Visit (INDEPENDENT_AMBULATORY_CARE_PROVIDER_SITE_OTHER): Payer: BC Managed Care – PPO | Admitting: Obstetrics & Gynecology

## 2023-10-22 ENCOUNTER — Other Ambulatory Visit (HOSPITAL_COMMUNITY)
Admission: RE | Admit: 2023-10-22 | Discharge: 2023-10-22 | Disposition: A | Discharge status: Home / Self Care | Payer: BC Managed Care – PPO | Source: Ambulatory Visit | Attending: Obstetrics & Gynecology | Admitting: Obstetrics & Gynecology

## 2023-10-22 VITALS — BP 157/86 | HR 82 | Wt 169.0 lb

## 2023-10-22 DIAGNOSIS — L292 Pruritus vulvae: Secondary | ICD-10-CM | POA: Diagnosis not present

## 2023-10-22 DIAGNOSIS — N898 Other specified noninflammatory disorders of vagina: Secondary | ICD-10-CM | POA: Insufficient documentation

## 2023-10-22 DIAGNOSIS — R21 Rash and other nonspecific skin eruption: Secondary | ICD-10-CM | POA: Diagnosis not present

## 2023-10-22 MED ORDER — CLOBETASOL PROPIONATE 0.05 % EX CREA
1.0000 | TOPICAL_CREAM | Freq: Two times a day (BID) | CUTANEOUS | 2 refills | Status: AC
Start: 2023-10-22 — End: ?

## 2023-10-22 MED ORDER — CLOTRIMAZOLE-BETAMETHASONE 1-0.05 % EX CREA
1.0000 | TOPICAL_CREAM | Freq: Two times a day (BID) | CUTANEOUS | 1 refills | Status: AC
Start: 2023-10-22 — End: ?

## 2023-10-22 NOTE — Progress Notes (Signed)
 History:  48 y.o. [id number] here today for eval of perianal itching. Pt not sexually active since April 2024. Pt with a h/o Psoriasis. Took OTC steroid cream with no relief of sx. Denies vaginal discharge.   The following portions of the patient's history were reviewed and updated as appropriate: allergies, current medications, past family history, past medical history, past social history, past surgical history and problem list.  Review of Systems:  Pertinent items are noted in HPI.    Objective:  Physical Exam Blood pressure (!) 157/86, pulse 82, weight 169 lb (76.7 kg).  CONSTITUTIONAL: Well-developed, well-nourished female in no acute distress.  HENT:  Normocephalic, atraumatic EYES: Conjunctivae and EOM are normal. No scleral icterus.  NECK: Normal range of motion SKIN: Skin is warm and dry. No rash noted. Not diaphoretic.No pallor. NEUROLGIC: Alert and oriented to person, place, and time. Normal coordination.  Pelvic: there is hypopigmentation around the anus. This area is excoriated. No satellite lesions noted. I suspect that is this c/w pts h/o psoriasis although it is not a usual location for this.   Assessment & Plan:  Kimberly Ali" was seen today for gynecologic exam.  Diagnoses and all orders for this visit:  Vaginal itching -     Cervicovaginal ancillary only -     clotrimazole-betamethasone (LOTRISONE) cream; Apply 1 Application topically 2 (two) times daily. For 2 weeks  Perianal rash -     Cervicovaginal ancillary only -     clotrimazole-betamethasone (LOTRISONE) cream; Apply 1 Application topically 2 (two) times daily. For 2 weeks -     clobetasol cream (TEMOVATE) 0.05 %; Apply 1 Application topically 2 (two) times daily. Apply to affected area   F/u in 4 weeks or sooner prn   Mayank Teuscher L. Harraway-Smith, M.D., [place] [place]

## 2023-10-23 LAB — CERVICOVAGINAL ANCILLARY ONLY
Bacterial Vaginitis (gardnerella): NEGATIVE
Candida Glabrata: NEGATIVE
Candida Vaginitis: POSITIVE — AB
Comment: NEGATIVE
Comment: NEGATIVE
Comment: NEGATIVE

## 2023-10-27 ENCOUNTER — Telehealth: Payer: Self-pay

## 2023-10-27 DIAGNOSIS — B379 Candidiasis, unspecified: Secondary | ICD-10-CM

## 2023-10-27 MED ORDER — FLUCONAZOLE 150 MG PO TABS: ORAL_TABLET | ORAL | 1 refills | Status: DC

## 2023-10-27 NOTE — Telephone Encounter (Signed)
 Called patient to inform her that she tested positive for yeast. Diflucan 150 mg take 1 tablet PO was sent to her pharmacy. Understanding was voiced. Shiro Ellerman l Kashena Novitski, CMA

## 2023-10-29 ENCOUNTER — Other Ambulatory Visit: Payer: Self-pay | Admitting: Obstetrics & Gynecology

## 2023-11-10 ENCOUNTER — Ambulatory Visit (INDEPENDENT_AMBULATORY_CARE_PROVIDER_SITE_OTHER): Payer: BC Managed Care – PPO | Admitting: Obstetrics and Gynecology

## 2023-11-10 VITALS — BP 155/107 | HR 76 | Wt 166.0 lb

## 2023-11-10 DIAGNOSIS — N898 Other specified noninflammatory disorders of vagina: Secondary | ICD-10-CM

## 2023-11-10 NOTE — Progress Notes (Signed)
    GYNECOLOGY VISIT  Patient name: Kimberly Ali MRN [id number]  Date of birth: March 23, 1976 Chief Complaint:   Pruritis  History:  Kimberly Ali is a 48 y.o. [id number] being seen today for follow up of perianal/vulvar itching. Used the cream and itching has completely resolved and she feels much better. Able to sleep through the night now.     2/26 visit with [facility]: presented with perianal itching without response to OTC steroid cream. Exam demonstrated hypopigmentation around anus with excoriations, no satellite lesions consistent with history of psoriasis. Vaginitis and started on clobetasol.   Past Medical History:  Diagnosis Date   Allergy    hayfever   Anal skin tag    Eczema    Headache    History of hay fever    History of migraine    Hypertension    Moderate persistent asthma 12/20/2016   Seizures (HCC)    11-25-19- one time occurence, asthma induced, none since   Urticaria     Past Surgical History:  Procedure Laterality Date   ABDOMINAL HYSTERECTOMY     Fibroids   CERVIX SURGERY     x2   DILATION AND CURETTAGE OF UTERUS     EXCISION OF SKIN TAG N/A 07/23/2017   Procedure: EXCISION OF SKIN TAG;  Surgeon: Berna Bue, MD;  Location: [facility];  Service: General;  Laterality: N/A;   HEMORRHOID SURGERY  2018   states laser surgery for one hemorrhoid    The following portions of the patient's history were reviewed and updated as appropriate: allergies, current medications, past family history, past medical history, past social history, past surgical history and problem list.   Review of Systems:  Pertinent items are noted in HPI. Comprehensive review of systems was otherwise negative.   Objective:  Physical Exam BP (!) 155/107 (BP Location: Right Arm, Patient Position: Sitting, Cuff Size: Large)   Pulse 76   Wt 166 lb (75.3 kg)   BMI 27.62 kg/m    Physical Exam Vitals and nursing note reviewed. Exam conducted with a  chaperone present.  Constitutional:      Appearance: Normal appearance.  HENT:     Head: Normocephalic and atraumatic.  Pulmonary:     Effort: Pulmonary effort is normal.     Breath sounds: Normal breath sounds.  Genitourinary:    General: Normal vulva.     Exam position: Lithotomy position.     Vagina: Normal.     Cervix: Normal.     Comments: Resolving hypopigmentation of perianal skin No excoriations noted Normal vulvar skin  Skin:    General: Skin is warm and dry.  Neurological:     General: No focal deficit present.     Mental Status: She is alert.  Psychiatric:        Mood and Affect: Mood normal.        Behavior: Behavior normal.        Thought Content: Thought content normal.        Judgment: Judgment normal.        Assessment & Plan:   1. Vaginal itching (Primary) Now resolved with clobetasol. Recommend to apply again if there is recurrence.    Routine preventative health maintenance measures emphasized.  Lorriane Shire, MD [facility] for [facility], [facility]

## 2023-12-04 ENCOUNTER — Other Ambulatory Visit: Payer: Self-pay | Admitting: Dermatology

## 2023-12-04 ENCOUNTER — Encounter: Payer: Self-pay | Admitting: Family Medicine

## 2023-12-04 MED ORDER — TRIAMCINOLONE ACETONIDE 0.1 % EX OINT: 1.0000 | TOPICAL_OINTMENT | Freq: Two times a day (BID) | CUTANEOUS | 0 refills | Status: AC

## 2023-12-22 ENCOUNTER — Ambulatory Visit: Admitting: Medical

## 2024-01-12 ENCOUNTER — Telehealth: Admitting: Family Medicine

## 2024-01-12 ENCOUNTER — Encounter: Payer: Self-pay | Admitting: Family Medicine

## 2024-01-12 ENCOUNTER — Ambulatory Visit

## 2024-01-12 DIAGNOSIS — G43809 Other migraine, not intractable, without status migrainosus: Secondary | ICD-10-CM

## 2024-01-12 MED ORDER — KETOROLAC TROMETHAMINE 60 MG/2ML IM SOLN
60.0000 mg | Freq: Once | INTRAMUSCULAR | Status: AC
  Administered 2024-01-12: 60 mg via INTRAMUSCULAR

## 2024-01-12 MED ORDER — RIZATRIPTAN BENZOATE 10 MG PO TABS: 10.0000 mg | ORAL_TABLET | ORAL | 1 refills | Status: DC | PRN

## 2024-01-12 NOTE — Progress Notes (Signed)
 Chief Complaint  Patient presents with   Headache    Headaches     Kimberly Ali is a 48 y.o. female here for evaluation of bilateral headache. We are interacting via web portal for an electronic face-to-face visit. I verified patient's ID using 2 identifiers. Patient agreed to proceed with visit via this method. Patient is at home, I am at office. Patient and I are present for visit.   Flared up over the past 6 weeks. No obvious cause/trigger for worsening HA's.  Treatment: no chronic tx; Aleve and Excedrin acutely without sig relief.  Aura: None Palliation: Sleep, darkness Provocation: possibly computer screen or driving at night Associated symptoms: nausea, light sensitivity Denies: Gum chewing, jaw pain, injury, diplopia, hemianopsia, ataxia, neck stiffness, conjunctival injection, lacrimation Currently with headache? Yes Failed therapies: No prescribed medications have been used recently that have failed.  Objective No conversational dyspnea Age appropriate judgment and insight Nml affect and mood  Vestibular migraine - Plan: rizatriptan  (MAXALT ) 10 MG tablet  Exacerbation of chronic issue.  Refill Maxalt  as needed.  Will have her come in for Toradol  injection 60 mg IM.  We did discuss a prophylactic medication which she would like to hold off on for now.  She will let us  know if she changes her mind. The patient voiced understanding and agreement to the plan.  Shellie Dials [place], [place] 11:51 AM 01/12/24

## 2024-01-12 NOTE — Progress Notes (Signed)
 Patient here today for tordoal 60 mg , which was discussed during virtual visit today 01/12/24.   Pt tolerated injection well.

## 2024-01-20 ENCOUNTER — Encounter: Payer: Self-pay | Admitting: Dermatology

## 2024-01-20 ENCOUNTER — Ambulatory Visit (INDEPENDENT_AMBULATORY_CARE_PROVIDER_SITE_OTHER): Payer: BC Managed Care – PPO | Admitting: Dermatology

## 2024-01-20 VITALS — BP 153/107

## 2024-01-20 DIAGNOSIS — L309 Dermatitis, unspecified: Secondary | ICD-10-CM

## 2024-01-20 DIAGNOSIS — L408 Other psoriasis: Secondary | ICD-10-CM

## 2024-01-20 MED ORDER — NYSTATIN-TRIAMCINOLONE 100000-0.1 UNIT/GM-% EX OINT: 1.0000 | TOPICAL_OINTMENT | Freq: Two times a day (BID) | CUTANEOUS | 0 refills | Status: DC

## 2024-01-20 MED ORDER — HYDROCORTISONE 2.5 % EX OINT: TOPICAL_OINTMENT | Freq: Two times a day (BID) | CUTANEOUS | 0 refills | Status: AC

## 2024-01-20 NOTE — Patient Instructions (Addendum)
 Date: Tue Jan 20 2024  Hello Ms. Kimberly Ali,  Thank you for visiting today. Here is a summary of the key instructions:  - Medications:   - Use hydrocortisone  2.5% ointment for eyelid dermatitis for up to 10 days   - If eyelid dermatitis persists after 10 days, switch to tacrolimus    - Use nystatin-triamcinolone  ointment for underarm rash  - Skin Care:   - After special events requiring makeup, wash it off right away   - Apply hydrocortisone  ointment after removing makeup to prevent flares   - Try VaniCream for underarms (with or without aluminum)   - After 2 weeks of triamcinolone , consider using aluminum chloride or Drysol at night   - Do not use benzoyl peroxide or aluminum chloride until underarm rash has healed  - Follow-up: Return for follow-up appointment in 3 months  Please reach out if you have any questions or concerns.  Warm regards,  Dr. Louana Roup,  Dermatology         Important Information  Due to recent changes in healthcare laws, you may see results of your pathology and/or laboratory studies on MyChart before the doctors have had a chance to review them. We understand that in some cases there may be results that are confusing or concerning to you. Please understand that not all results are received at the same time and often the doctors may need to interpret multiple results in order to provide you with the best plan of care or course of treatment. Therefore, we ask that you please give us  2 business days to thoroughly review all your results before contacting the office for clarification. Should we see a critical lab result, you will be contacted sooner.   If You Need Anything After Your Visit  If you have any questions or concerns for your doctor, please call our main line at [phone] If no one answers, please leave a voicemail as directed and we will return your call as soon as possible. Messages left after 4 pm will be answered the following business  day.   You may also send us  a message via MyChart. We typically respond to MyChart messages within 1-2 business days.  For prescription refills, please ask your pharmacy to contact our office. Our fax number is [phone].  If you have an urgent issue when the clinic is closed that cannot wait until the next business day, you can page your doctor at the number below.    Please note that while we do our best to be available for urgent issues outside of office hours, we are not available 24/7.   If you have an urgent issue and are unable to reach us , you may choose to seek medical care at your doctor's office, retail clinic, urgent care center, or emergency room.  If you have a medical emergency, please immediately call 911 or go to the emergency department. In the event of inclement weather, please call our main line at [phone] for an update on the status of any delays or closures.  Dermatology Medication Tips: Please keep the boxes that topical medications come in in order to help keep track of the instructions about where and how to use these. Pharmacies typically print the medication instructions only on the boxes and not directly on the medication tubes.   If your medication is too expensive, please contact our office at [phone] or send us  a message through MyChart.   We are unable to tell what your co-pay for medications  will be in advance as this is different depending on your insurance coverage. However, we may be able to find a substitute medication at lower cost or fill out paperwork to get insurance to cover a needed medication.   If a prior authorization is required to get your medication covered by your insurance company, please allow us  1-2 business days to complete this process.  Drug prices often vary depending on where the prescription is filled and some pharmacies may offer cheaper prices.  The website www.goodrx.com contains coupons for medications through  different pharmacies. The prices here do not account for what the cost may be with help from insurance (it may be cheaper with your insurance), but the website can give you the price if you did not use any insurance.  - You can print the associated coupon and take it with your prescription to the pharmacy.  - You may also stop by our office during regular business hours and pick up a GoodRx coupon card.  - If you need your prescription sent electronically to a different pharmacy, notify our office through [facility] or by phone at [phone]

## 2024-01-20 NOTE — Progress Notes (Signed)
 Follow-Up Visit   Subjective  Kimberly Ali is a 48 y.o. 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 She is alternating TMC and Tacrolimus  every 2 weeks.  It cleared up within the first 2 weeks of starting medications and stayed clear for about a month. She says that works fine as long as she doesn't use deodorant. She says she has a small that is similar to spoiled milk or yeast. She has tried several deodorants. Some have worked for a while then stopped working and she gets irritated. She has even tried powder and baking soda. She is now starting to sweat and feels like she needs deodorant. She also has eyelid dermatitis vs psoriasis and she is using tacrolimus  as she needs it. Her daughter got married the end of April and she wore make up and it has caused a flare.    The following portions of the chart were reviewed this encounter and updated as appropriate: medications, allergies, medical history  Review of Systems:  No other skin or systemic complaints except as noted in HPI or Assessment and Plan.  Objective  Well appearing patient in no apparent distress; mood and affect are within normal limits.   A focused examination was performed of the following areas:   Relevant exam findings are noted in the Assessment and Plan.    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 History of treatment using triamcinolone  and tacrolimus , which cleared the condition within two weeks, but recurrence noted with sweating. Condition stayed clear for about a month after initial treatment. Current examination reveals a hyperpigmented patch with an atrophic plaque of active inflammation. Eyelid involvement typically takes 3-4 weeks to clear with tacrolimus . Recent flare-up  occurred after makeup use for a wedding on April 27th.  - Plan:    For acute flares on eyelids:     - Prescribe hydrocortisone  2.5% ointment for up to 10 days     - If symptoms persist after 10 days: Switch to tacrolimus     For underarms:     - Continue triamcinolone  for two weeks     - After two weeks: Consider aluminum chloride (Drysol) at night     - Recommend Vanny Cream with or without aluminum     - If odor persists: Consider switching to nystatin-triamcinolone  ointment    Consider benzoyl peroxide wash to reduce bacteria    Discuss at follow-up:     - Potential systemic treatment (e.g., Otezla/apremilast) if needed     - Glycopyrrolate for sweating if needed    Schedule follow-up appointment in 3 months  2. Hyperhidrosis - Assessment:  Patient reports excessive sweating, particularly in the underarm area, exacerbating the inverse psoriasis. Multiple over-the-counter deodorants tried without success, causing skin irritation. Aluminum-free and alcohol-free options ineffective long-term.  - Plan:    Prescribe Drysol (aluminum chloride) for nighttime use after two weeks of triamcinolone  treatment    Recommend VaniCream with or without aluminum for daytime use   Consider ashing with BPO when note flared     Discuss potential use of glycopyrrolate for sweating at follow-up appointment   Return in about 3 months (around 04/21/2024) for Follow up.  I, Eliot Guernsey, CMA, am acting as scribe for [employer], DO .   Documentation: I have reviewed the above documentation for  accuracy and completeness, and I agree with the above.  Louana Roup, DO

## 2024-03-15 ENCOUNTER — Other Ambulatory Visit (HOSPITAL_COMMUNITY)
Admission: RE | Admit: 2024-03-15 | Discharge: 2024-03-15 | Disposition: A | Discharge status: Home / Self Care | Source: Ambulatory Visit | Attending: Obstetrics and Gynecology | Admitting: Obstetrics and Gynecology

## 2024-03-15 ENCOUNTER — Ambulatory Visit (INDEPENDENT_AMBULATORY_CARE_PROVIDER_SITE_OTHER)

## 2024-03-15 VITALS — BP 149/89 | HR 75 | Ht 65.0 in | Wt 165.0 lb

## 2024-03-15 DIAGNOSIS — N898 Other specified noninflammatory disorders of vagina: Secondary | ICD-10-CM | POA: Diagnosis not present

## 2024-03-15 NOTE — Progress Notes (Signed)
 SUBJECTIVE:  48 y.o. female who desires a STI screen. Denies bleeding or significant pelvic pain. Complaining of white discharge with odorNo UTI symptoms. Denies history of known exposure to STD.  No LMP recorded. Patient has had a hysterectomy.  OBJECTIVE:  She appears well.   ASSESSMENT:  STI Screen   PLAN:  Pt offered STI blood screening-declined GC, chlamydia, and trichomonas probe sent to lab.  Treatment: To be determined once lab results are received.  Pt follow up as needed.

## 2024-03-17 ENCOUNTER — Other Ambulatory Visit: Payer: Self-pay

## 2024-03-17 ENCOUNTER — Telehealth: Payer: Self-pay

## 2024-03-17 MED ORDER — ALBUTEROL SULFATE HFA 108 (90 BASE) MCG/ACT IN AERS: INHALATION_SPRAY | RESPIRATORY_TRACT | 5 refills | Status: AC

## 2024-03-17 NOTE — Telephone Encounter (Signed)
 Refill sent and sent pt message letting her know it was sent.

## 2024-03-17 NOTE — Telephone Encounter (Signed)
 Copied from CRM [id number]. Topic: Clinical - Prescription Issue >> Mar 17, 2024  8:33 AM [place] E wrote: Reason for CRM: Patient is currently out of town in [place]  and accidentally grabbed an empty inhaler. Questioning if she could have a refill of her VENTOLIN  HFA 108 (90 Base) MCG/ACT inhaler and EPINEPHrine  0.3 mg/0.3 mL IJ SOAJ injection sent to this location instead:  [facility] [address] [place], [place] [address]

## 2024-03-18 ENCOUNTER — Ambulatory Visit: Payer: Self-pay | Admitting: Obstetrics and Gynecology

## 2024-03-18 DIAGNOSIS — B9689 Other specified bacterial agents as the cause of diseases classified elsewhere: Secondary | ICD-10-CM

## 2024-03-18 LAB — CERVICOVAGINAL ANCILLARY ONLY
Bacterial Vaginitis (gardnerella): POSITIVE — AB
Candida Glabrata: NEGATIVE
Candida Vaginitis: NEGATIVE
Chlamydia: NEGATIVE
Comment: NEGATIVE
Comment: NEGATIVE
Comment: NEGATIVE
Comment: NEGATIVE
Comment: NEGATIVE
Comment: NORMAL
Neisseria Gonorrhea: NEGATIVE
Trichomonas: NEGATIVE

## 2024-03-18 MED ORDER — METRONIDAZOLE 0.75 % VA GEL: 1.0000 | Freq: Every day | VAGINAL | 1 refills | Status: AC

## 2024-03-26 ENCOUNTER — Ambulatory Visit: Admission: EM | Admit: 2024-03-26 | Discharge: 2024-03-26 | Disposition: A | Discharge status: Home / Self Care

## 2024-03-26 DIAGNOSIS — G43909 Migraine, unspecified, not intractable, without status migrainosus: Secondary | ICD-10-CM

## 2024-03-26 DIAGNOSIS — L309 Dermatitis, unspecified: Secondary | ICD-10-CM | POA: Insufficient documentation

## 2024-03-26 HISTORY — DX: Anxiety disorder, unspecified: F41.9

## 2024-03-26 MED ORDER — ONDANSETRON 4 MG PO TBDP
4.0000 mg | ORAL_TABLET | Freq: Once | ORAL | Status: AC
  Administered 2024-03-26: 4 mg via ORAL

## 2024-03-26 MED ORDER — KETOROLAC TROMETHAMINE 30 MG/ML IJ SOLN
30.0000 mg | Freq: Once | INTRAMUSCULAR | Status: AC
  Administered 2024-03-26: 30 mg via INTRAMUSCULAR

## 2024-03-26 NOTE — ED Provider Notes (Signed)
 [facility]    CSN: [id number] Arrival date & time: 03/26/24  1540      History   Chief Complaint Chief Complaint  Patient presents with   Headache    HPI Kimberly Ali is a 48 y.o. female.   Patient here today for evaluation of migraine headache.  She reports that she woke up this morning with that and that has continued through the day.  She tried taking a dose of Excedrin Migraine without resolution.  She reports some nausea and photosensitivity but has not vomited.  The history is provided by the patient.  Headache Associated symptoms: nausea and photophobia   Associated symptoms: no abdominal pain, no fever, no numbness and no vomiting     Past Medical History:  Diagnosis Date   Allergy    hayfever   Anal skin tag    Anxiety    Eczema    Headache    History of hay fever    History of migraine    Hypertension    Moderate persistent asthma 12/20/2016   Seizures (HCC)    11-25-19- one time occurence, asthma induced, none since   Urticaria     Patient Active Problem List   Diagnosis Date Noted   Dermatitis 03/26/2024   Elevated blood pressure reading in office without diagnosis of hypertension 07/09/2023   GAD (generalized anxiety disorder) 07/04/2021   OSA (obstructive sleep apnea) 07/17/2020   Healthcare maintenance 07/17/2020   Seasonal and perennial allergic rhinoconjunctivitis 03/20/2020   Severe persistent asthma without complication 02/17/2020   Adverse food reaction 02/17/2020   Other atopic dermatitis 02/17/2020   Cervical spondylosis 09/03/2019   Vestibular migraine 08/18/2019   Moderate persistent asthma 12/20/2016    Past Surgical History:  Procedure Laterality Date   ABDOMINAL HYSTERECTOMY     Fibroids   CERVIX SURGERY     x2   DILATION AND CURETTAGE OF UTERUS     EXCISION OF SKIN TAG N/A 07/23/2017   Procedure: EXCISION OF SKIN TAG;  Surgeon: Signe Mitzie LABOR, MD;  Location: [facility];  Service:  General;  Laterality: N/A;   HEMORRHOID SURGERY  2018   states laser surgery for one hemorrhoid    OB History     Gravida  6   Para  4   Term  1   Preterm  3   AB  2   Living  4      SAB  2   IAB  0   Ectopic  0   Multiple  1   Live Births  4            Home Medications    Prior to Admission medications   Medication Sig Start Date End Date Taking? Authorizing Provider  aspirin -acetaminophen -caffeine (EXCEDRIN MIGRAINE) 250-250-65 MG tablet Take by mouth every 6 (six) hours as needed for headache.   Yes [provider]  cetirizine (ZYRTEC ALLERGY) 10 MG tablet Take 10 mg by mouth daily. 09/17/16  Yes [provider]  rizatriptan  (MAXALT ) 10 MG tablet Take 1 tablet (10 mg total) by mouth as needed for migraine. May repeat in 2 hours if needed 01/12/24  Yes Wendling, [name] Mt, DO  albuterol  (VENTOLIN  HFA) 108 (90 Base) MCG/ACT inhaler INHALE 1-2 PUFFS BY MOUTH EVERY 6 HOURS AS NEEDED FOR WHEEZE OR SHORTNESS OF BREATH 03/17/24   Wendling, [name] Mt, DO  Albuterol  Sulfate (PROAIR  RESPICLICK) 108 (90 Base) MCG/ACT AEPB Inhale 2 puffs into the lungs every  6 (six) hours as needed.    [provider]  citalopram  (CELEXA ) 20 MG tablet Take 1 tablet (20 mg total) by mouth daily. 03/03/23   Frann [name] Mt, DO  clobetasol  cream (TEMOVATE ) 0.05 % Apply 1 Application topically 2 (two) times daily. Apply to affected area 10/22/23   Corene Coy, MD  clotrimazole -betamethasone  (LOTRISONE ) cream Apply 1 Application topically 2 (two) times daily. For 2 weeks 10/22/23   Corene Coy, MD  EPINEPHrine  0.3 mg/0.3 mL IJ SOAJ injection Inject 0.3 mg into the muscle as needed for anaphylaxis. 10/09/22   Frann [name] Mt, DO  fluticasone  furoate-vilanterol (BREO ELLIPTA ) 200-25 MCG/ACT AEPB INHALE 1 PUFF BY MOUTH DAILY 09/03/23   Frann [name] Mt, DO  Fluticasone  Propionate HFA (FLOVENT  HFA IN) Flovent  HFA    [provider]  hydrocortisone  2.5 % ointment Apply topically 2 (two) times daily. Apply to affected areas of eyelids for 10 days as needed for flares. 01/20/24   Alm Delon SAILOR, DO  hydrOXYzine (ATARAX) 10 MG tablet Take 10 mg by mouth as directed.    [provider]  ipratropium-albuterol  (DUONEB) 0.5-2.5 (3) MG/3ML SOLN Take 3 mLs by nebulization every 4 (four) hours as needed. 07/07/23   Henderly, Britni A, PA-C  metroNIDAZOLE  (METROGEL ) 0.75 % vaginal gel Place 1 Applicatorful vaginally at bedtime. Apply one applicatorful to vagina at bedtime for 5 days 03/18/24   Abigail Rollo DASEN, MD  montelukast  (SINGULAIR ) 10 MG tablet Take 1 tablet (10 mg total) by mouth at bedtime. 07/09/23   Cyndi Shaver, PA-C  nystatin -triamcinolone  ointment (MYCOLOG) Apply 1 Application topically 2 (two) times daily. Apply to underarms Patient not taking: Reported on 03/15/2024 01/20/24   Alm Delon SAILOR, DO  pimecrolimus  (ELIDEL ) 1 % cream Apply topically 2 (two) times daily. Apply to eyelids twice daily until clear. Use on body twice daily for 2 weeks alternating with Triamcinolone  ointment. Patient not taking: Reported on 03/15/2024 04/16/23   Alm Delon SAILOR, DO  triamcinolone  ointment (KENALOG ) 0.1 % Apply 1 Application topically 2 (two) times daily. Apply to affected areas on the body twice daily for 2 weeks. Alternate with Pimecrolimus  cream 12/04/23   Alm Delon SAILOR, DO    Family History Family History  Problem Relation Age of Onset   Cancer Father        prostate   Hypertension Father    Diabetes Father    Allergic rhinitis Father    Alcohol abuse Maternal Grandmother    Alcohol abuse Maternal Grandfather    Asthma Neg Hx    Urticaria Neg Hx    Eczema Neg Hx    Colon cancer Neg Hx    Colon polyps Neg Hx    Esophageal cancer Neg Hx    Rectal cancer Neg Hx    Stomach cancer Neg Hx     Social History Social History   Tobacco Use   Smoking status: Former    Current packs/day:  0.00    Types: Cigarettes    Quit date: 12/16/2002    Years since quitting: 21.2   Smokeless tobacco: Never   Tobacco comments:    > 16 yrs ago, 1 pack would last her a week  Vaping Use   Vaping status: Never Used  Substance Use Topics   Alcohol use: Yes    Alcohol/week: 1.0 standard drink of alcohol    Types: 1 Glasses of wine per week    Comment: social    Drug use: No  Allergies   Egg-derived products, Haemophilus influenzae vaccines, Peanut-containing drug products, Almond (diagnostic), Dust mite extract, and Mixed ragweed   Review of Systems Review of Systems  Constitutional:  Negative for chills and fever.  Eyes:  Positive for photophobia. Negative for discharge, redness and visual disturbance.  Respiratory:  Negative for shortness of breath.   Gastrointestinal:  Positive for nausea. Negative for abdominal pain and vomiting.  Neurological:  Positive for headaches. Negative for numbness.     Physical Exam Triage Vital Signs ED Triage Vitals  Encounter Vitals Group     BP 03/26/24 1550 122/85     Girls Systolic BP Percentile --      Girls Diastolic BP Percentile --      Boys Systolic BP Percentile --      Boys Diastolic BP Percentile --      Pulse Rate 03/26/24 1550 69     Resp 03/26/24 1550 18     Temp 03/26/24 1550 98.4 F (36.9 C)     Temp Source 03/26/24 1550 Oral     SpO2 03/26/24 1550 99 %     Weight 03/26/24 1547 160 lb (72.6 kg)     Height 03/26/24 1547 5' 5 (1.651 m)     Head Circumference --      Peak Flow --      Pain Score 03/26/24 1545 7     Pain Loc --      Pain Education --      Exclude from Growth Chart --    No data found.  Updated Vital Signs BP 122/85 (BP Location: Left Arm)   Pulse 69   Temp 98.4 F (36.9 C) (Oral)   Resp 18   Ht 5' 5 (1.651 m)   Wt 160 lb (72.6 kg)   SpO2 99%   BMI 26.63 kg/m   Visual Acuity Right Eye Distance:   Left Eye Distance:   Bilateral Distance:    Right Eye Near:   Left Eye Near:     Bilateral Near:     Physical Exam Vitals and nursing note reviewed.  Constitutional:      General: She is not in acute distress.    Appearance: Normal appearance. She is not ill-appearing.  HENT:     Head: Normocephalic and atraumatic.     Right Ear: Tympanic membrane and ear canal normal. There is no impacted cerumen.     Left Ear: Tympanic membrane and ear canal normal. There is no impacted cerumen.     Nose: Nose normal. No congestion or rhinorrhea.  Eyes:     Extraocular Movements: Extraocular movements intact.     Conjunctiva/sclera: Conjunctivae normal.     Pupils: Pupils are equal, round, and reactive to light.  Cardiovascular:     Rate and Rhythm: Normal rate and regular rhythm.  Pulmonary:     Effort: Pulmonary effort is normal. No respiratory distress.     Breath sounds: Normal breath sounds. No wheezing or rales.  Chest:     Chest wall: No tenderness.  Neurological:     Mental Status: She is alert.     Comments: Normal speech, no facial droop, normal finger-nose and heel-to-shin  Psychiatric:        Mood and Affect: Mood normal.        Behavior: Behavior normal.        Thought Content: Thought content normal.      UC Treatments / Results  Labs (all labs ordered are listed, but only abnormal  results are displayed) Labs Reviewed - No data to display  EKG   Radiology No results found.  Procedures Procedures (including critical care time)  Medications Ordered in UC Medications  ketorolac  (TORADOL ) 30 MG/ML injection 30 mg (30 mg Intramuscular Given 03/26/24 1633)  ondansetron  (ZOFRAN -ODT) disintegrating tablet 4 mg (4 mg Oral Given 03/26/24 1633)    Initial Impression / Assessment and Plan / UC Course  I have reviewed the triage vital signs and the nursing notes.  Pertinent labs & imaging results that were available during my care of the patient were reviewed by me and considered in my medical decision making (see chart for details).    Will treat with  Toradol  injection in office as well as Zofran .  Advised evaluation in the emergency room if headache worsens in any way.  Patient expressed understanding.  Final Clinical Impressions(s) / UC Diagnoses   Final diagnoses:  Migraine without status migrainosus, not intractable, unspecified migraine type   Discharge Instructions   None    ED Prescriptions   None    PDMP not reviewed this encounter.   Billy Asberry FALCON, PA-C 03/26/24 [phone]

## 2024-03-26 NOTE — ED Triage Notes (Signed)
 I woke up with a Migraine this morning and has continued all day. I have not reached out to my PCP or Specialist that follow's me for this yet. Some nausea, no vomiting. Vision-light (sensitivity).

## 2024-03-30 ENCOUNTER — Other Ambulatory Visit (HOSPITAL_COMMUNITY): Payer: Self-pay

## 2024-03-30 ENCOUNTER — Ambulatory Visit (INDEPENDENT_AMBULATORY_CARE_PROVIDER_SITE_OTHER): Admitting: Family Medicine

## 2024-03-30 ENCOUNTER — Telehealth: Payer: Self-pay

## 2024-03-30 VITALS — BP 128/78 | HR 86 | Temp 98.0°F | Resp 16 | Ht 65.0 in

## 2024-03-30 DIAGNOSIS — G43809 Other migraine, not intractable, without status migrainosus: Secondary | ICD-10-CM | POA: Diagnosis not present

## 2024-03-30 MED ORDER — RIMEGEPANT SULFATE 75 MG PO TBDP: ORAL_TABLET | ORAL | 1 refills | Status: AC

## 2024-03-30 NOTE — Telephone Encounter (Signed)
 Pharmacy Patient Advocate Encounter   Received notification from CoverMyMeds that prior authorization for Nurtec 75MG  dispersible tablets is required/requested.   Insurance verification completed.   The patient is insured through [facility] .   Per test claim: PA required; PA submitted to above mentioned insurance via CoverMyMeds Key/confirmation #/EOC [id number] Status is pending

## 2024-03-30 NOTE — Progress Notes (Signed)
 Chief Complaint  Patient presents with   Headache    Headaches    Subjective: Patient is a 48 y.o. female here for f/u headaches.  Patient has a history of migraines.  Last Friday, she started having a severe left orbital and zygomatic headache.  She went to urgent care and received an injection which did help little bit.  She has been having lingering fullness in her head.  She has associated nausea with this.  Rizatriptan  is no longer helpful.  Sumatriptan was never helpful.  She has never been on anything else.  She will have a migraine 3 or 4 days of the month.  No obvious cause for this flare.  Past Medical History:  Diagnosis Date   Allergy    hayfever   Anal skin tag    Anxiety    Eczema    Headache    History of hay fever    History of migraine    Hypertension    Moderate persistent asthma 12/20/2016   Seizures (HCC)    11-25-19- one time occurence, asthma induced, none since   Urticaria     Objective: BP 128/78 (BP Location: Left Arm, Patient Position: Sitting)   Pulse 86   Temp 98 F (36.7 C) (Oral)   Resp 16   Ht 5' 5 (1.651 m)   SpO2 100%   BMI 26.63 kg/m  General: Awake, appears stated age MSK: No TTP over the TMJ or temporalis region, no TTP over the cervical paraspinal musculature or midline.  Mild TTP over the suboccipital triangle bilaterally. Neuro: Speech is fluent and goal oriented.  No cerebellar signs.  DTRs equal and symmetric throughout, no clonus.  5/5 strength throughout. Lungs: No accessory muscle use Psych: Age appropriate judgment and insight, normal affect and mood  Assessment and Plan: Vestibular migraine - Plan: Rimegepant Sulfate  75 MG TBDP  Chronic, not controlled.  Stop the rizatriptan , start rimegepant 75 mg daily as needed.  Repeat in 2 hours if no improvement.  She will let me know if there are cost issues. The patient voiced understanding and agreement to the plan.  [name] [place], DO 03/30/24  3:26 PM

## 2024-03-31 NOTE — Telephone Encounter (Signed)
 Pharmacy Patient Advocate Encounter  Received notification from [facility] that Prior Authorization for Nurtec 75MG  dispersible tablets  has been APPROVED from 03/30/24 to 03/30/25   PA #/Case ID/Reference #: [id number]

## 2024-03-31 NOTE — Telephone Encounter (Signed)
 Sent pt message letting her know  Prior Authorization for Nurtec 75MG  dispersible tablets  has been  APPROVED from 03/30/24 to 03/30/25

## 2024-05-03 ENCOUNTER — Ambulatory Visit: Admitting: Dermatology

## 2024-05-10 ENCOUNTER — Ambulatory Visit

## 2024-05-11 ENCOUNTER — Ambulatory Visit

## 2024-05-12 ENCOUNTER — Ambulatory Visit
Admission: RE | Admit: 2024-05-12 | Discharge: 2024-05-12 | Disposition: A | Discharge status: Home / Self Care | Payer: Self-pay | Source: Ambulatory Visit | Attending: Family Medicine | Admitting: Family Medicine

## 2024-05-12 VITALS — BP 139/87 | HR 95 | Temp 98.1°F | Resp 16

## 2024-05-12 DIAGNOSIS — J309 Allergic rhinitis, unspecified: Secondary | ICD-10-CM

## 2024-05-12 DIAGNOSIS — J4540 Moderate persistent asthma, uncomplicated: Secondary | ICD-10-CM

## 2024-05-12 DIAGNOSIS — Z1152 Encounter for screening for COVID-19: Secondary | ICD-10-CM

## 2024-05-12 LAB — POC SOFIA SARS ANTIGEN FIA: SARS Coronavirus 2 Ag: NEGATIVE

## 2024-05-12 MED ORDER — EPINEPHRINE 0.3 MG/0.3ML IJ SOAJ: 0.3000 mg | INTRAMUSCULAR | 0 refills | Status: AC | PRN

## 2024-05-12 MED ORDER — PREDNISONE 20 MG PO TABS: ORAL_TABLET | ORAL | 0 refills | Status: DC

## 2024-05-12 NOTE — ED Provider Notes (Signed)
 [facility]  Note:  This document was prepared using Conservation officer, historic buildings and may include unintentional dictation errors.  MRN: [id number] DOB: [birth year]  Subjective:   Kimberly Ali is a 48 y.o. female presenting for 3-day history of sinus congestion, sinus drainage, sinus headaches.  No cough, chest pain, shortness of breath or wheezing.  Has allergic rhinitis and fall can be difficult for the patient.  Has previously used steroids successfully despite having a history of seizures.  Needs a refill on her EpiPen .  Had a COVID exposure, would like testing.  No current facility-administered medications for this encounter.  Current Outpatient Medications:    albuterol  (VENTOLIN  HFA) 108 (90 Base) MCG/ACT inhaler, INHALE 1-2 PUFFS BY MOUTH EVERY 6 HOURS AS NEEDED FOR WHEEZE OR SHORTNESS OF BREATH, Disp: 18 each, Rfl: 5   Albuterol  Sulfate (PROAIR  RESPICLICK) 108 (90 Base) MCG/ACT AEPB, Inhale 2 puffs into the lungs every 6 (six) hours as needed., Disp: , Rfl:    aspirin -acetaminophen -caffeine (EXCEDRIN MIGRAINE) 250-250-65 MG tablet, Take by mouth every 6 (six) hours as needed for headache., Disp: , Rfl:    cetirizine (ZYRTEC ALLERGY) 10 MG tablet, Take 10 mg by mouth daily., Disp: , Rfl:    citalopram  (CELEXA ) 20 MG tablet, Take 1 tablet (20 mg total) by mouth daily., Disp: 90 tablet, Rfl: 2   clobetasol  cream (TEMOVATE ) 0.05 %, Apply 1 Application topically 2 (two) times daily. Apply to affected area, Disp: 30 g, Rfl: 2   clotrimazole -betamethasone  (LOTRISONE ) cream, Apply 1 Application topically 2 (two) times daily. For 2 weeks, Disp: 30 g, Rfl: 1   EPINEPHrine  0.3 mg/0.3 mL IJ SOAJ injection, Inject 0.3 mg into the muscle as needed for anaphylaxis., Disp: 2 each, Rfl: 2   fluticasone  furoate-vilanterol (BREO ELLIPTA ) 200-25 MCG/ACT AEPB, INHALE 1 PUFF BY MOUTH DAILY, Disp: 180 each, Rfl: 2   Fluticasone  Propionate HFA (FLOVENT  HFA IN), Flovent   HFA, Disp: , Rfl:    hydrocortisone  2.5 % ointment, Apply topically 2 (two) times daily. Apply to affected areas of eyelids for 10 days as needed for flares., Disp: 30 g, Rfl: 0   hydrOXYzine (ATARAX) 10 MG tablet, Take 10 mg by mouth as directed., Disp: , Rfl:    ipratropium-albuterol  (DUONEB) 0.5-2.5 (3) MG/3ML SOLN, Take 3 mLs by nebulization every 4 (four) hours as needed., Disp: 360 mL, Rfl: 0   metroNIDAZOLE  (METROGEL ) 0.75 % vaginal gel, Place 1 Applicatorful vaginally at bedtime. Apply one applicatorful to vagina at bedtime for 5 days, Disp: 70 g, Rfl: 1   montelukast  (SINGULAIR ) 10 MG tablet, Take 1 tablet (10 mg total) by mouth at bedtime., Disp: 90 tablet, Rfl: 1   Rimegepant Sulfate  75 MG TBDP, Dissolve under tongue once daily as needed for migraines, repeat in 2 hrs if no improvement., Disp: 8 tablet, Rfl: 1   triamcinolone  ointment (KENALOG ) 0.1 %, Apply 1 Application topically 2 (two) times daily. Apply to affected areas on the body twice daily for 2 weeks. Alternate with Pimecrolimus  cream, Disp: 453 g, Rfl: 0   Allergies  Allergen Reactions   Egg-Derived Products     Causes nausea and vomiting   Haemophilus Influenzae Vaccines Anaphylaxis   Peanut-Containing Drug Products Anaphylaxis   Almond (Diagnostic) Itching and Swelling   Dust Mite Extract    Mixed Ragweed     Past Medical History:  Diagnosis Date   Allergy    hayfever   Anal skin tag    Anxiety  Eczema    Headache    History of hay fever    History of migraine    Hypertension    Moderate persistent asthma 12/20/2016   Seizures (HCC)    11-25-19- one time occurence, asthma induced, none since   Urticaria      Past Surgical History:  Procedure Laterality Date   ABDOMINAL HYSTERECTOMY     Fibroids   CERVIX SURGERY     x2   DILATION AND CURETTAGE OF UTERUS     EXCISION OF SKIN TAG N/A 07/23/2017   Procedure: EXCISION OF SKIN TAG;  Surgeon: Signe Mitzie LABOR, MD;  Location: [facility];   Service: General;  Laterality: N/A;   HEMORRHOID SURGERY  2018   states laser surgery for one hemorrhoid    Family History  Problem Relation Age of Onset   Cancer Father        prostate   Hypertension Father    Diabetes Father    Allergic rhinitis Father    Alcohol abuse Maternal Grandmother    Alcohol abuse Maternal Grandfather    Asthma Neg Hx    Urticaria Neg Hx    Eczema Neg Hx    Colon cancer Neg Hx    Colon polyps Neg Hx    Esophageal cancer Neg Hx    Rectal cancer Neg Hx    Stomach cancer Neg Hx     Social History   Tobacco Use   Smoking status: Former    Current packs/day: 0.00    Types: Cigarettes    Quit date: 12/16/2002    Years since quitting: 21.4   Smokeless tobacco: Never   Tobacco comments:    > 16 yrs ago, 1 pack would last her a week  Vaping Use   Vaping status: Never Used  Substance Use Topics   Alcohol use: Yes    Alcohol/week: 1.0 standard drink of alcohol    Types: 1 Glasses of wine per week    Comment: social    Drug use: No    ROS   Objective:   Vitals: BP 139/87 (BP Location: Left Arm)   Pulse 95   Temp 98.1 F (36.7 C) (Oral)   Resp 16   SpO2 98%   Physical Exam Constitutional:      General: She is not in acute distress.    Appearance: Normal appearance. She is well-developed and normal weight. She is not ill-appearing, toxic-appearing or diaphoretic.  HENT:     Head: Normocephalic and atraumatic.     Right Ear: Tympanic membrane, ear canal and external ear normal. No drainage or tenderness. No middle ear effusion. There is no impacted cerumen. Tympanic membrane is not erythematous or bulging.     Left Ear: Tympanic membrane, ear canal and external ear normal. No drainage or tenderness.  No middle ear effusion. There is no impacted cerumen. Tympanic membrane is not erythematous or bulging.     Nose: Nose normal. No congestion or rhinorrhea.     Mouth/Throat:     Mouth: Mucous membranes are moist. No oral lesions.      Pharynx: No pharyngeal swelling, oropharyngeal exudate, posterior oropharyngeal erythema or uvula swelling.     Tonsils: No tonsillar exudate or tonsillar abscesses.  Eyes:     General: No scleral icterus.       Right eye: No discharge.        Left eye: No discharge.     Extraocular Movements: Extraocular movements intact.  Right eye: Normal extraocular motion.     Left eye: Normal extraocular motion.     Conjunctiva/sclera: Conjunctivae normal.  Cardiovascular:     Rate and Rhythm: Normal rate and regular rhythm.     Heart sounds: Normal heart sounds. No murmur heard.    No friction rub. No gallop.  Pulmonary:     Effort: Pulmonary effort is normal. No respiratory distress.     Breath sounds: No stridor. No wheezing, rhonchi or rales.  Chest:     Chest wall: No tenderness.  Musculoskeletal:     Cervical back: Normal range of motion and neck supple.  Lymphadenopathy:     Cervical: No cervical adenopathy.  Skin:    General: Skin is warm and dry.  Neurological:     General: No focal deficit present.     Mental Status: She is alert and oriented to person, place, and time.  Psychiatric:        Mood and Affect: Mood normal.        Behavior: Behavior normal.    Results for orders placed or performed during the hospital encounter of 05/12/24 (from the past 24 hours)  POC SARS Coronavirus 2 Ag     Status: None   Collection Time: 05/12/24  1:35 PM  Result Value Ref Range   SARS Coronavirus 2 Ag Negative Negative    Assessment and Plan :   PDMP not reviewed this encounter.  1. Allergic rhinitis, unspecified seasonality, unspecified trigger   2. Encounter for screening for COVID-19   3. Moderate persistent asthma, uncomplicated    Suspect allergic rhinitis flare, acute rhinitis and recommended a oral prednisone  course given patient's significant difficulty with allergies, asthma.  Deferred imaging given clear cardiopulmonary exam, hemodynamically stable vital signs.  Maintain  chronic medications.  Counseled patient on potential for adverse effects with medications prescribed/recommended today, ER and return-to-clinic precautions discussed, patient verbalized understanding.    Christopher Savannah, [place] 05/12/24 1355

## 2024-05-12 NOTE — ED Triage Notes (Signed)
 Pt c/o HA x 3 days feels like sinus-taking claritin -concerned she had +covid exposure at [facility] gait

## 2024-05-18 ENCOUNTER — Ambulatory Visit: Admitting: Dermatology

## 2024-06-18 ENCOUNTER — Telehealth: Payer: Self-pay | Admitting: Family Medicine

## 2024-06-18 NOTE — Telephone Encounter (Signed)
Paperwork placed in provider bin for review.

## 2024-06-18 NOTE — Telephone Encounter (Signed)
 Pt dropped off papers to be filled out by pcp. Placed papers in pcps box. Please call pt when they have been faxed and ready to be picked up.

## 2024-06-19 ENCOUNTER — Encounter: Payer: Self-pay | Admitting: Family Medicine

## 2024-06-25 ENCOUNTER — Encounter: Payer: Self-pay | Admitting: Family Medicine

## 2024-06-25 NOTE — Telephone Encounter (Signed)
 Spoke with pt and she have a copy of forms.

## 2024-06-29 ENCOUNTER — Encounter: Payer: Self-pay | Admitting: Family Medicine

## 2024-06-30 NOTE — Telephone Encounter (Signed)
 Spoke with pt about paperwork and advised to bring forms back.

## 2024-08-27 ENCOUNTER — Ambulatory Visit: Admitting: Family Medicine

## 2024-08-27 ENCOUNTER — Encounter: Payer: Self-pay | Admitting: Family Medicine

## 2024-08-27 ENCOUNTER — Ambulatory Visit (INDEPENDENT_AMBULATORY_CARE_PROVIDER_SITE_OTHER): Admitting: Family Medicine

## 2024-08-27 ENCOUNTER — Ambulatory Visit: Payer: Self-pay | Admitting: Family Medicine

## 2024-08-27 VITALS — BP 134/82 | HR 94 | Temp 98.0°F | Resp 16 | Ht 65.0 in | Wt 165.4 lb

## 2024-08-27 DIAGNOSIS — M545 Low back pain, unspecified: Secondary | ICD-10-CM

## 2024-08-27 DIAGNOSIS — J4540 Moderate persistent asthma, uncomplicated: Secondary | ICD-10-CM | POA: Diagnosis not present

## 2024-08-27 DIAGNOSIS — M79604 Pain in right leg: Secondary | ICD-10-CM | POA: Diagnosis not present

## 2024-08-27 DIAGNOSIS — R1031 Right lower quadrant pain: Secondary | ICD-10-CM

## 2024-08-27 LAB — URINALYSIS
Bilirubin Urine: NEGATIVE
Hgb urine dipstick: NEGATIVE
Ketones, ur: NEGATIVE
Leukocytes,Ua: NEGATIVE
Nitrite: NEGATIVE
Specific Gravity, Urine: 1.02 (ref 1.000–1.030)
Total Protein, Urine: NEGATIVE
Urine Glucose: NEGATIVE
Urobilinogen, UA: 0.2 (ref 0.0–1.0)
pH: 6.5 (ref 5.0–8.0)

## 2024-08-27 MED ORDER — MONTELUKAST SODIUM 10 MG PO TABS: 10.0000 mg | ORAL_TABLET | Freq: Every day | ORAL | 11 refills | Status: AC

## 2024-08-27 NOTE — Patient Instructions (Addendum)
 Heat (pad or rice pillow in microwave) over affected area, 10-15 minutes twice daily.   Ice/cold pack over area for 10-15 min twice daily.  We will be in touch with your urine results.  Let us  know if you need anything.  EXERCISES  RANGE OF MOTION (ROM) AND STRETCHING EXERCISES - Low Back Pain Most people with lower back pain will find that their symptoms get worse with excessive bending forward (flexion) or arching at the lower back (extension). The exercises that will help resolve your symptoms will focus on the opposite motion.  If you have pain, numbness or tingling which travels down into your buttocks, leg or foot, the goal of the therapy is for these symptoms to move closer to your back and eventually resolve. Sometimes, these leg symptoms will get better, but your lower back pain may worsen. This is often an indication of progress in your rehabilitation. Be very alert to any changes in your symptoms and the activities in which you participated in the 24 hours prior to the change. Sharing this information with your caregiver will allow him or her to most efficiently treat your condition. These exercises may help you when beginning to rehabilitate your injury. Your symptoms may resolve with or without further involvement from your physician, physical therapist or athletic trainer. While completing these exercises, remember:  Restoring tissue flexibility helps normal motion to return to the joints. This allows healthier, less painful movement and activity. An effective stretch should be held for at least 30 seconds. A stretch should never be painful. You should only feel a gentle lengthening or release in the stretched tissue. FLEXION RANGE OF MOTION AND STRETCHING EXERCISES:  STRETCH - Flexion, Single Knee to Chest  Lie on a firm bed or floor with both legs extended in front of you. Keeping one leg in contact with the floor, bring your opposite knee to your chest. Hold your leg in place by  either grabbing behind your thigh or at your knee. Pull until you feel a gentle stretch in your low back. Hold 30 seconds. Slowly release your grasp and repeat the exercise with the opposite side. Repeat 2 times. Complete this exercise 3 times per week.   STRETCH - Flexion, Double Knee to Chest Lie on a firm bed or floor with both legs extended in front of you. Keeping one leg in contact with the floor, bring your opposite knee to your chest. Tense your stomach muscles to support your back and then lift your other knee to your chest. Hold your legs in place by either grabbing behind your thighs or at your knees. Pull both knees toward your chest until you feel a gentle stretch in your low back. Hold 30 seconds. Tense your stomach muscles and slowly return one leg at a time to the floor. Repeat 2 times. Complete this exercise 3 times per week.   STRETCH - Low Trunk Rotation Lie on a firm bed or floor. Keeping your legs in front of you, bend your knees so they are both pointed toward the ceiling and your feet are flat on the floor. Extend your arms out to the side. This will stabilize your upper body by keeping your shoulders in contact with the floor. Gently and slowly drop both knees together to one side until you feel a gentle stretch in your low back. Hold for 30 seconds. Tense your stomach muscles to support your lower back as you bring your knees back to the starting position. Repeat the exercise  to the other side. Repeat 2 times. Complete this exercise at least 3 times per week.   EXTENSION RANGE OF MOTION AND FLEXIBILITY EXERCISES:  STRETCH - Extension, Prone on Elbows  Lie on your stomach on the floor, a bed will be too soft. Place your palms about shoulder width apart and at the height of your head. Place your elbows under your shoulders. If this is too painful, stack pillows under your chest. Allow your body to relax so that your hips drop lower and make contact more completely with  the floor. Hold this position for 30 seconds. Slowly return to lying flat on the floor. Repeat 2 times. Complete this exercise 3 times per week.   RANGE OF MOTION - Extension, Prone Press Ups Lie on your stomach on the floor, a bed will be too soft. Place your palms about shoulder width apart and at the height of your head. Keeping your back as relaxed as possible, slowly straighten your elbows while keeping your hips on the floor. You may adjust the placement of your hands to maximize your comfort. As you gain motion, your hands will come more underneath your shoulders. Hold this position 30 seconds. Slowly return to lying flat on the floor. Repeat 2 times. Complete this exercise 3 times per week.   RANGE OF MOTION- Quadruped, Neutral Spine  Assume a hands and knees position on a firm surface. Keep your hands under your shoulders and your knees under your hips. You may place padding under your knees for comfort. Drop your head and point your tailbone toward the ground below you. This will round out your lower back like an angry cat. Hold this position for 30 seconds. Slowly lift your head and release your tail bone so that your back sags into a large arch, like an old horse. Hold this position for 30 seconds. Repeat this until you feel limber in your low back. Now, find your sweet spot. This will be the most comfortable position somewhere between the two previous positions. This is your neutral spine. Once you have found this position, tense your stomach muscles to support your low back. Hold this position for 30 seconds. Repeat 2 times. Complete this exercise 3 times per week.   STRENGTHENING EXERCISES - Low Back Sprain These exercises may help you when beginning to rehabilitate your injury. These exercises should be done near your sweet spot. This is the neutral, low-back arch, somewhere between fully rounded and fully arched, that is your least painful position. When performed in this  safe range of motion, these exercises can be used for people who have either a flexion or extension based injury. These exercises may resolve your symptoms with or without further involvement from your physician, physical therapist or athletic trainer. While completing these exercises, remember:  Muscles can gain both the endurance and the strength needed for everyday activities through controlled exercises. Complete these exercises as instructed by your physician, physical therapist or athletic trainer. Increase the resistance and repetitions only as guided. You may experience muscle soreness or fatigue, but the pain or discomfort you are trying to eliminate should never worsen during these exercises. If this pain does worsen, stop and make certain you are following the directions exactly. If the pain is still present after adjustments, discontinue the exercise until you can discuss the trouble with your caregiver.  STRENGTHENING - Deep Abdominals, Pelvic Tilt  Lie on a firm bed or floor. Keeping your legs in front of you, bend your knees  so they are both pointed toward the ceiling and your feet are flat on the floor. Tense your lower abdominal muscles to press your low back into the floor. This motion will rotate your pelvis so that your tail bone is scooping upwards rather than pointing at your feet or into the floor. With a gentle tension and even breathing, hold this position for 3 seconds. Repeat 2 times. Complete this exercise 3 times per week.   STRENGTHENING - Abdominals, Crunches  Lie on a firm bed or floor. Keeping your legs in front of you, bend your knees so they are both pointed toward the ceiling and your feet are flat on the floor. Cross your arms over your chest. Slightly tip your chin down without bending your neck. Tense your abdominals and slowly lift your trunk high enough to just clear your shoulder blades. Lifting higher can put excessive stress on the lower back and does not  further strengthen your abdominal muscles. Control your return to the starting position. Repeat 2 times. Complete this exercise 3 times per week.   STRENGTHENING - Quadruped, Opposite UE/LE Lift  Assume a hands and knees position on a firm surface. Keep your hands under your shoulders and your knees under your hips. You may place padding under your knees for comfort. Find your neutral spine and gently tense your abdominal muscles so that you can maintain this position. Your shoulders and hips should form a rectangle that is parallel with the floor and is not twisted. Keeping your trunk steady, lift your right hand no higher than your shoulder and then your left leg no higher than your hip. Make sure you are not holding your breath. Hold this position for 30 seconds. Continuing to keep your abdominal muscles tense and your back steady, slowly return to your starting position. Repeat with the opposite arm and leg. Repeat 2 times. Complete this exercise 3 times per week.   STRENGTHENING - Abdominals and Quadriceps, Straight Leg Raise  Lie on a firm bed or floor with both legs extended in front of you. Keeping one leg in contact with the floor, bend the other knee so that your foot can rest flat on the floor. Find your neutral spine, and tense your abdominal muscles to maintain your spinal position throughout the exercise. Slowly lift your straight leg off the floor about 6 inches for a count of 3, making sure to not hold your breath. Still keeping your neutral spine, slowly lower your leg all the way to the floor. Repeat this exercise with each leg 2 times. Complete this exercise 3 times per week.  POSTURE AND BODY MECHANICS CONSIDERATIONS - Low Back Sprain Keeping correct posture when sitting, standing or completing your activities will reduce the stress put on different body tissues, allowing injured tissues a chance to heal and limiting painful experiences. The following are general guidelines  for improved posture.  While reading these guidelines, remember: The exercises prescribed by your provider will help you have the flexibility and strength to maintain correct postures. The correct posture provides the best environment for your joints to work. All of your joints have less wear and tear when properly supported by a spine with good posture. This means you will experience a healthier, less painful body. Correct posture must be practiced with all of your activities, especially prolonged sitting and standing. Correct posture is as important when doing repetitive low-stress activities (typing) as it is when doing a single heavy-load activity (lifting).  RESTING POSITIONS Consider  which positions are most painful for you when choosing a resting position. If you have pain with flexion-based activities (sitting, bending, stooping, squatting), choose a position that allows you to rest in a less flexed posture. You would want to avoid curling into a fetal position on your side. If your pain worsens with extension-based activities (prolonged standing, working overhead), avoid resting in an extended position such as sleeping on your stomach. Most people will find more comfort when they rest with their spine in a more neutral position, neither too rounded nor too arched. Lying on a non-sagging bed on your side with a pillow between your knees, or on your back with a pillow under your knees will often provide some relief. Keep in mind, being in any one position for a prolonged period of time, no matter how correct your posture, can still lead to stiffness.  PROPER SITTING POSTURE In order to minimize stress and discomfort on your spine, you must sit with correct posture. Sitting with good posture should be effortless for a healthy body. Returning to good posture is a gradual process. Many people can work toward this most comfortably by using various supports until they have the flexibility and strength to  maintain this posture on their own. When sitting with proper posture, your ears will fall over your shoulders and your shoulders will fall over your hips. You should use the back of the chair to support your upper back. Your lower back will be in a neutral position, just slightly arched. You may place a small pillow or folded towel at the base of your lower back for  support.  When working at a desk, create an environment that supports good, upright posture. Without extra support, muscles tire, which leads to excessive strain on joints and other tissues. Keep these recommendations in mind:  CHAIR: A chair should be able to slide under your desk when your back makes contact with the back of the chair. This allows you to work closely. The chair's height should allow your eyes to be level with the upper part of your monitor and your hands to be slightly lower than your elbows.  BODY POSITION Your feet should make contact with the floor. If this is not possible, use a foot rest. Keep your ears over your shoulders. This will reduce stress on your neck and low back.  INCORRECT SITTING POSTURES  If you are feeling tired and unable to assume a healthy sitting posture, do not slouch or slump. This puts excessive strain on your back tissues, causing more damage and pain. Healthier options include: Using more support, like a lumbar pillow. Switching tasks to something that requires you to be upright or walking. Talking a brief walk. Lying down to rest in a neutral-spine position.  PROLONGED STANDING WHILE SLIGHTLY LEANING FORWARD  When completing a task that requires you to lean forward while standing in one place for a long time, place either foot up on a stationary 2-4 inch high object to help maintain the best posture. When both feet are on the ground, the lower back tends to lose its slight inward curve. If this curve flattens (or becomes too large), then the back and your other joints will experience  too much stress, tire more quickly, and can cause pain.  CORRECT STANDING POSTURES Proper standing posture should be assumed with all daily activities, even if they only take a few moments, like when brushing your teeth. As in sitting, your ears should fall over your  shoulders and your shoulders should fall over your hips. You should keep a slight tension in your abdominal muscles to brace your spine. Your tailbone should point down to the ground, not behind your body, resulting in an over-extended swayback posture.   INCORRECT STANDING POSTURES  Common incorrect standing postures include a forward head, locked knees and/or an excessive swayback. WALKING Walk with an upright posture. Your ears, shoulders and hips should all line-up.  PROLONGED ACTIVITY IN A FLEXED POSITION When completing a task that requires you to bend forward at your waist or lean over a low surface, try to find a way to stabilize 3 out of 4 of your limbs. You can place a hand or elbow on your thigh or rest a knee on the surface you are reaching across. This will provide you more stability, so that your muscles do not tire as quickly. By keeping your knees relaxed, or slightly bent, you will also reduce stress across your lower back. CORRECT LIFTING TECHNIQUES  DO : Assume a wide stance. This will provide you more stability and the opportunity to get as close as possible to the object which you are lifting. Tense your abdominals to brace your spine. Bend at the knees and hips. Keeping your back locked in a neutral-spine position, lift using your leg muscles. Lift with your legs, keeping your back straight. Test the weight of unknown objects before attempting to lift them. Try to keep your elbows locked down at your sides in order get the best strength from your shoulders when carrying an object.   Always ask for help when lifting heavy or awkward objects. INCORRECT LIFTING TECHNIQUES DO NOT:  Lock your knees when lifting,  even if it is a small object. Bend and twist. Pivot at your feet or move your feet when needing to change directions. Assume that you can safely pick up even a paperclip without proper posture.

## 2024-08-27 NOTE — Progress Notes (Signed)
 Chief Complaint  Patient presents with   GI Problem    GI problem     Kimberly Ali is here for abdominal pain.  Duration: 1 week Had associated radiation down her leg.  Nighttime awakenings? No Bleeding? No Weight loss? No Palliation: none Provocation: none Has gotten better.  Associated symptoms: had some urinary freq but that resolved Denies: fever, nausea, vomiting, diarrhea, urinary complaints, new sexual partners, trauma, change in activity, dietary changes, and incontinence Treatment to date: stretching, Aleve  Past Medical History:  Diagnosis Date   Allergy    hayfever   Anal skin tag    Anxiety    Eczema    Headache    History of hay fever    History of migraine    Hypertension    Moderate persistent asthma 12/20/2016   Seizures ([facility])    11-25-19- one time occurence, asthma induced, none since   Urticaria     BP 134/82 (BP Location: Left Arm, Patient Position: Sitting)   Pulse 94   Temp 98 F (36.7 C) (Oral)   Resp 16   Ht 5' 5 (1.651 m)   Wt 165 lb 6.4 oz (75 kg)   SpO2 96%   BMI 27.52 kg/m  Gen.: Awake, alert, appears stated age HEENT: Mucous membranes moist without mucosal lesions Heart: Regular rate and rhythm without murmurs Lungs: Clear auscultation bilaterally, no rales or wheezing, normal effort without accessory muscle use. Abdomen: Bowel sounds are present. Abdomen is soft, mild ttp in RLQ, nondistended, no masses or organomegaly. Negative Murphy's, Rovsing's, and Carnett's sign. MSK: TTP over lumbar parasp on R, poor hamstring ROM b/l Neuro: DTR's equal and symmetric b/l LE's, no clonus, no cerebellar signs, gait nml, 5/5 strength in LE's b/l, neg straight leg b/l Psych: Age appropriate judgment and insight. Normal mood and affect.  Right lower quadrant abdominal pain - Plan: Urinalysis, Urine Culture  Moderate persistent asthma without complication - Plan: montelukast  (SINGULAIR ) 10 MG tablet  Low back pain radiating to right  leg  Ck urine. Suspect #2 contributing. If no improvement, will ck TV US  to evaluate ovary on that side. Having some improvement, doubt appendicitis Heat, ice, Tylenol , stretches/exercises. Send message in a few weeks if no better. PT a consideration. Refill. F/u as originally scheduled. Pt voiced understanding and agreement to the plan.  [name] Mt Wilder, DO 08/27/2024 7:37 AM

## 2024-08-28 LAB — URINE CULTURE
MICRO NUMBER:: 17419438
Result:: NO GROWTH
SPECIMEN QUALITY:: ADEQUATE

## 2024-10-19 ENCOUNTER — Ambulatory Visit: Admitting: Dermatology
